# Patient Record
Sex: Male | Born: 1935 | Race: White | Hispanic: No | Marital: Married | Smoking: Former smoker
Health system: Southern US, Community
[De-identification: ages and names within clinical notes are randomized; demographics above are authoritative.]

## PROBLEM LIST (undated history)

## (undated) DIAGNOSIS — E7800 Pure hypercholesterolemia, unspecified: Secondary | ICD-10-CM

## (undated) DIAGNOSIS — Z8719 Personal history of other diseases of the digestive system: Secondary | ICD-10-CM

## (undated) DIAGNOSIS — J189 Pneumonia, unspecified organism: Secondary | ICD-10-CM

## (undated) DIAGNOSIS — I119 Hypertensive heart disease without heart failure: Secondary | ICD-10-CM

## (undated) DIAGNOSIS — I2510 Atherosclerotic heart disease of native coronary artery without angina pectoris: Secondary | ICD-10-CM

## (undated) DIAGNOSIS — K219 Gastro-esophageal reflux disease without esophagitis: Secondary | ICD-10-CM

## (undated) DIAGNOSIS — Q211 Atrial septal defect: Secondary | ICD-10-CM

## (undated) DIAGNOSIS — G459 Transient cerebral ischemic attack, unspecified: Principal | ICD-10-CM

## (undated) DIAGNOSIS — R569 Unspecified convulsions: Secondary | ICD-10-CM

## (undated) DIAGNOSIS — J1100 Influenza due to unidentified influenza virus with unspecified type of pneumonia: Secondary | ICD-10-CM

## (undated) DIAGNOSIS — M519 Unspecified thoracic, thoracolumbar and lumbosacral intervertebral disc disorder: Secondary | ICD-10-CM

## (undated) DIAGNOSIS — G259 Extrapyramidal and movement disorder, unspecified: Secondary | ICD-10-CM

## (undated) HISTORY — PX: BACK SURGERY: SHX140

## (undated) HISTORY — PX: COLONOSCOPY: SHX174

## (undated) HISTORY — PX: CATARACT EXTRACTION W/ INTRAOCULAR LENS  IMPLANT, BILATERAL: SHX1307

## (undated) HISTORY — DX: Hypertensive heart disease without heart failure: I11.9

## (undated) HISTORY — DX: Transient cerebral ischemic attack, unspecified: G45.9

## (undated) HISTORY — DX: Pneumonia, unspecified organism: J18.9

## (undated) HISTORY — DX: Atrial septal defect: Q21.1

## (undated) HISTORY — PX: REFRACTIVE SURGERY: SHX103

## (undated) HISTORY — DX: Influenza due to unidentified influenza virus with unspecified type of pneumonia: J11.00

## (undated) HISTORY — DX: Atherosclerotic heart disease of native coronary artery without angina pectoris: I25.10

## (undated) HISTORY — DX: Extrapyramidal and movement disorder, unspecified: G25.9

## (undated) HISTORY — DX: Unspecified thoracic, thoracolumbar and lumbosacral intervertebral disc disorder: M51.9

---

## 1978-05-17 HISTORY — PX: LUMBAR LAMINECTOMY: SHX95

## 1993-05-17 HISTORY — PX: CORONARY ARTERY BYPASS GRAFT: SHX141

## 1998-10-02 ENCOUNTER — Ambulatory Visit (HOSPITAL_COMMUNITY): Admission: RE | Admit: 1998-10-02 | Discharge: 1998-10-02 | Payer: Self-pay | Admitting: Cardiology

## 2001-03-20 ENCOUNTER — Ambulatory Visit (HOSPITAL_COMMUNITY): Admission: RE | Admit: 2001-03-20 | Discharge: 2001-03-20 | Payer: Self-pay | Admitting: Gastroenterology

## 2001-10-25 ENCOUNTER — Ambulatory Visit (HOSPITAL_COMMUNITY): Admission: RE | Admit: 2001-10-25 | Discharge: 2001-10-25 | Payer: Self-pay | Admitting: Obstetrics and Gynecology

## 2002-08-15 ENCOUNTER — Ambulatory Visit (HOSPITAL_COMMUNITY): Admission: RE | Admit: 2002-08-15 | Discharge: 2002-08-15 | Payer: Self-pay | Admitting: Specialist

## 2003-07-12 ENCOUNTER — Encounter: Admission: RE | Admit: 2003-07-12 | Discharge: 2003-07-12 | Payer: Self-pay | Admitting: Cardiology

## 2003-11-26 ENCOUNTER — Ambulatory Visit (HOSPITAL_COMMUNITY): Admission: RE | Admit: 2003-11-26 | Discharge: 2003-11-26 | Payer: Self-pay | Admitting: Gastroenterology

## 2005-05-28 ENCOUNTER — Encounter: Admission: RE | Admit: 2005-05-28 | Discharge: 2005-05-28 | Payer: Self-pay | Admitting: Gastroenterology

## 2011-05-21 DIAGNOSIS — R05 Cough: Secondary | ICD-10-CM | POA: Diagnosis not present

## 2011-05-21 DIAGNOSIS — R1310 Dysphagia, unspecified: Secondary | ICD-10-CM | POA: Diagnosis not present

## 2011-06-03 DIAGNOSIS — H35379 Puckering of macula, unspecified eye: Secondary | ICD-10-CM | POA: Diagnosis not present

## 2011-06-07 ENCOUNTER — Other Ambulatory Visit: Payer: Self-pay | Admitting: Cardiology

## 2011-06-22 ENCOUNTER — Other Ambulatory Visit: Payer: Self-pay | Admitting: Cardiology

## 2011-06-28 ENCOUNTER — Other Ambulatory Visit: Payer: Self-pay | Admitting: Gastroenterology

## 2011-06-28 DIAGNOSIS — R1310 Dysphagia, unspecified: Secondary | ICD-10-CM | POA: Diagnosis not present

## 2011-06-28 DIAGNOSIS — K219 Gastro-esophageal reflux disease without esophagitis: Secondary | ICD-10-CM | POA: Diagnosis not present

## 2011-06-28 DIAGNOSIS — R05 Cough: Secondary | ICD-10-CM | POA: Diagnosis not present

## 2011-07-01 ENCOUNTER — Ambulatory Visit
Admission: RE | Admit: 2011-07-01 | Discharge: 2011-07-01 | Disposition: A | Discharge status: Home / Self Care | Payer: Medicare Other | Source: Ambulatory Visit | Attending: Gastroenterology | Admitting: Gastroenterology

## 2011-07-01 DIAGNOSIS — R1310 Dysphagia, unspecified: Secondary | ICD-10-CM | POA: Diagnosis not present

## 2011-07-01 DIAGNOSIS — K219 Gastro-esophageal reflux disease without esophagitis: Secondary | ICD-10-CM | POA: Diagnosis not present

## 2011-07-01 DIAGNOSIS — K449 Diaphragmatic hernia without obstruction or gangrene: Secondary | ICD-10-CM | POA: Diagnosis not present

## 2011-07-22 ENCOUNTER — Other Ambulatory Visit: Payer: Self-pay | Admitting: Cardiology

## 2011-08-22 ENCOUNTER — Other Ambulatory Visit: Payer: Self-pay | Admitting: Cardiology

## 2011-08-27 DIAGNOSIS — Z951 Presence of aortocoronary bypass graft: Secondary | ICD-10-CM | POA: Diagnosis not present

## 2011-08-27 DIAGNOSIS — I119 Hypertensive heart disease without heart failure: Secondary | ICD-10-CM | POA: Diagnosis not present

## 2011-08-27 DIAGNOSIS — I2510 Atherosclerotic heart disease of native coronary artery without angina pectoris: Secondary | ICD-10-CM | POA: Diagnosis not present

## 2011-08-27 DIAGNOSIS — I209 Angina pectoris, unspecified: Secondary | ICD-10-CM | POA: Diagnosis not present

## 2011-08-27 DIAGNOSIS — E785 Hyperlipidemia, unspecified: Secondary | ICD-10-CM | POA: Diagnosis not present

## 2011-08-27 DIAGNOSIS — Z79899 Other long term (current) drug therapy: Secondary | ICD-10-CM | POA: Diagnosis not present

## 2011-09-02 DIAGNOSIS — R1310 Dysphagia, unspecified: Secondary | ICD-10-CM | POA: Diagnosis not present

## 2011-09-02 DIAGNOSIS — K219 Gastro-esophageal reflux disease without esophagitis: Secondary | ICD-10-CM | POA: Diagnosis not present

## 2011-10-07 DIAGNOSIS — J189 Pneumonia, unspecified organism: Secondary | ICD-10-CM | POA: Diagnosis not present

## 2011-10-07 DIAGNOSIS — R300 Dysuria: Secondary | ICD-10-CM | POA: Diagnosis not present

## 2011-10-07 DIAGNOSIS — R059 Cough, unspecified: Secondary | ICD-10-CM | POA: Diagnosis not present

## 2011-10-07 DIAGNOSIS — R05 Cough: Secondary | ICD-10-CM | POA: Diagnosis not present

## 2011-10-15 DIAGNOSIS — J189 Pneumonia, unspecified organism: Secondary | ICD-10-CM | POA: Diagnosis not present

## 2011-10-15 DIAGNOSIS — I951 Orthostatic hypotension: Secondary | ICD-10-CM | POA: Diagnosis not present

## 2011-11-24 DIAGNOSIS — R1310 Dysphagia, unspecified: Secondary | ICD-10-CM | POA: Diagnosis not present

## 2011-11-24 DIAGNOSIS — R05 Cough: Secondary | ICD-10-CM | POA: Diagnosis not present

## 2011-11-25 DIAGNOSIS — J189 Pneumonia, unspecified organism: Secondary | ICD-10-CM | POA: Diagnosis not present

## 2012-03-03 DIAGNOSIS — I209 Angina pectoris, unspecified: Secondary | ICD-10-CM | POA: Diagnosis not present

## 2012-03-03 DIAGNOSIS — R1310 Dysphagia, unspecified: Secondary | ICD-10-CM | POA: Diagnosis not present

## 2012-03-03 DIAGNOSIS — I2510 Atherosclerotic heart disease of native coronary artery without angina pectoris: Secondary | ICD-10-CM | POA: Diagnosis not present

## 2012-03-03 DIAGNOSIS — I119 Hypertensive heart disease without heart failure: Secondary | ICD-10-CM | POA: Diagnosis not present

## 2012-03-03 DIAGNOSIS — K219 Gastro-esophageal reflux disease without esophagitis: Secondary | ICD-10-CM | POA: Diagnosis not present

## 2012-03-03 DIAGNOSIS — E785 Hyperlipidemia, unspecified: Secondary | ICD-10-CM | POA: Diagnosis not present

## 2012-03-03 DIAGNOSIS — Z79899 Other long term (current) drug therapy: Secondary | ICD-10-CM | POA: Diagnosis not present

## 2012-03-03 DIAGNOSIS — Z951 Presence of aortocoronary bypass graft: Secondary | ICD-10-CM | POA: Diagnosis not present

## 2012-03-21 DIAGNOSIS — Z23 Encounter for immunization: Secondary | ICD-10-CM | POA: Diagnosis not present

## 2012-06-07 DIAGNOSIS — H35379 Puckering of macula, unspecified eye: Secondary | ICD-10-CM | POA: Diagnosis not present

## 2012-06-07 DIAGNOSIS — H26499 Other secondary cataract, unspecified eye: Secondary | ICD-10-CM | POA: Diagnosis not present

## 2012-07-06 DIAGNOSIS — R05 Cough: Secondary | ICD-10-CM | POA: Diagnosis not present

## 2012-07-06 DIAGNOSIS — R059 Cough, unspecified: Secondary | ICD-10-CM | POA: Diagnosis not present

## 2012-08-24 DIAGNOSIS — R269 Unspecified abnormalities of gait and mobility: Secondary | ICD-10-CM | POA: Diagnosis not present

## 2012-08-24 DIAGNOSIS — R05 Cough: Secondary | ICD-10-CM | POA: Diagnosis not present

## 2012-08-24 DIAGNOSIS — R059 Cough, unspecified: Secondary | ICD-10-CM | POA: Diagnosis not present

## 2012-09-05 DIAGNOSIS — K219 Gastro-esophageal reflux disease without esophagitis: Secondary | ICD-10-CM | POA: Diagnosis not present

## 2012-09-05 DIAGNOSIS — R1310 Dysphagia, unspecified: Secondary | ICD-10-CM | POA: Diagnosis not present

## 2012-09-08 DIAGNOSIS — I209 Angina pectoris, unspecified: Secondary | ICD-10-CM | POA: Diagnosis not present

## 2012-09-08 DIAGNOSIS — E785 Hyperlipidemia, unspecified: Secondary | ICD-10-CM | POA: Diagnosis not present

## 2012-09-08 DIAGNOSIS — Z79899 Other long term (current) drug therapy: Secondary | ICD-10-CM | POA: Diagnosis not present

## 2012-09-08 DIAGNOSIS — I2510 Atherosclerotic heart disease of native coronary artery without angina pectoris: Secondary | ICD-10-CM | POA: Diagnosis not present

## 2012-09-08 DIAGNOSIS — I119 Hypertensive heart disease without heart failure: Secondary | ICD-10-CM | POA: Diagnosis not present

## 2012-09-08 DIAGNOSIS — Z951 Presence of aortocoronary bypass graft: Secondary | ICD-10-CM | POA: Diagnosis not present

## 2012-10-11 ENCOUNTER — Encounter: Payer: Self-pay | Admitting: Neurology

## 2012-10-11 ENCOUNTER — Ambulatory Visit (INDEPENDENT_AMBULATORY_CARE_PROVIDER_SITE_OTHER): Payer: Medicare Other | Admitting: Neurology

## 2012-10-11 VITALS — BP 152/71 | HR 69 | Temp 98.3°F | Ht 69.0 in | Wt 204.0 lb

## 2012-10-11 DIAGNOSIS — G250 Essential tremor: Secondary | ICD-10-CM

## 2012-10-11 DIAGNOSIS — R490 Dysphonia: Secondary | ICD-10-CM | POA: Insufficient documentation

## 2012-10-11 DIAGNOSIS — R1319 Other dysphagia: Secondary | ICD-10-CM | POA: Diagnosis not present

## 2012-10-11 DIAGNOSIS — G20 Parkinson's disease: Secondary | ICD-10-CM | POA: Diagnosis not present

## 2012-10-11 DIAGNOSIS — J1100 Influenza due to unidentified influenza virus with unspecified type of pneumonia: Secondary | ICD-10-CM | POA: Insufficient documentation

## 2012-10-11 DIAGNOSIS — R251 Tremor, unspecified: Secondary | ICD-10-CM

## 2012-10-11 MED ORDER — CARBIDOPA-LEVODOPA 10-100 MG PO TABS: 1.0000 | ORAL_TABLET | Freq: Three times a day (TID) | ORAL | Status: DC

## 2012-10-11 NOTE — Progress Notes (Signed)
[facility]  Provider:  Dr Rodgerick Gilliand Referring Provider: Gildardo Cranker, MD Primary Care Physician:  Miguel Aschoff, MD  Chief Complaint  Patient presents with  . New Evaluation    tremor cogwheel rigidity, Ross, rm 10    HPI:  Clinton Hughes is a 79 y.o. male here as a referral from Dr. Tenny Craw for  Tremors and gait instability. Risk is 79 year old right-handed Caucasian married male reports that over the year 2013 he suffered twice from viral pneumonia he felt that he didn't recover completely to his former self felt deconditioned since and reports developing a tremor, muscle rigidity and a general sense of weakness. He also reports some balance problems or gait instability. A note is that the gentleman is well-nourished and she is to have a normal facial expression and not a masked face. His voice is coarse and he reports a history of cardia even for liquids. For 5 years ago he had such severe dysphagia  and he required a scratching procedure for the upper esophagus. He noticed that just last year following the pneumonia he seemed to have developed trouble handling saliva. Right now he states that he does not have any aspiration but that his slowing but also seems to be significantly delayed and slowed. He had no choking from solids or liquids.  His wife mentioned that he has begun sleeping more restlessly, she noticed his body posture to be rigid all night he actually is moving less than he used to. A year ago he had frequent frequently cold out in his sleep were seen to act out dreams, that has slightly decreased. However this is very reminiscent of REM behavior disorder.  As to his gait instability he had mentioned that he has to rise slowly or catch a couple of seconds before he initiates a movements safely vaginally when he comes from a seated position into the erect posture he seems to have trouble with orthostasis.  The patient also has sometimes diaphoretic episodes and  the skin seems to be moist and sweaty her, his wife has noticed that it seems to be related to the intake of spicy or acidic food or perhaps tomatoes. 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 She also had questions about alcohol intake in combination with dopaminergic medication. I asked [name] if he had noticed a change in his tremor when he drinks alcohol but he denies that he has seen a  Connection.    Review of Systems: Out of a complete 14 system review, the patient complains of only the following symptoms, and all other reviewed systems are negative.  tremor at rest and with action, dysphonia, gait instability.REM BD suspected.  Fall risk 8 points.  Denies auditory or visual hallucinations .  History   Social History  . Marital Status: Married    Spouse Name: N/A    Number of Children: N/A  . Years of Education: N/A   Occupational History  . Not on file.   Social History Main Topics  . Smoking status: Former Games developer  . Smokeless tobacco: Not on file     Comment: quit smoking 20-30 years ago  . Alcohol Use: Yes     Comment: 4-5 glasses of wine daily  . Drug Use: No  . Sexually Active: Not on file   Other Topics Concern  . Not on file   Social History Narrative  . No narrative on file  Family History  Problem Relation Age of Onset  . Thyroid disease Sister   . Thyroid disease Other     Past Medical History  Diagnosis Date  . Hypertension     benign  . Angina at rest   . Coronary artery disease   . Pneumonia 2014    x2  . Movement disorder   . Pneumonia and influenza      2 pneumonias within 2013.     Past Surgical History  Procedure Laterality Date  . Colonoscopy  2006/2008  . Coronary artery bypass graft  1995  . Lumbar laminectomy  1980  . Cataract extraction    . Electrocardiogram  2006  . Back surgery    . Bi pass      x5    Current Outpatient  Prescriptions  Medication Sig Dispense Refill  . amLODipine (NORVASC) 5 MG tablet Take 5 mg by mouth daily.      Marland Kitchen AMLODIPINE BESYLATE PO Take 5 mg by mouth daily.      Marland Kitchen aspirin 325 MG tablet Take 325 mg by mouth daily.      Marland Kitchen METOPROLOL TARTRATE PO Take 50 mg by mouth 2 (two) times daily.      Marland Kitchen omeprazole (PRILOSEC) 40 MG capsule Take 40 mg by mouth daily. Delayed release, once daily      . rosuvastatin (CRESTOR) 20 MG tablet Take 20 mg by mouth daily.      . valsartan (DIOVAN) 160 MG tablet Take 160 mg by mouth daily.      Marland Kitchen NITROSTAT 0.4 MG SL tablet        No current facility-administered medications for this visit.    Allergies as of 10/11/2012  . (No Known Allergies)    Vitals: BP 152/71  Pulse 69  Temp(Src) 98.3 F (36.8 C) (Oral)  Ht 5\' 9"  (1.753 m)  Wt 204 lb (92.534 kg)  BMI 30.11 kg/m2 Last Weight:  Wt Readings from Last 1 Encounters:  10/11/12 204 lb (92.534 kg)   Last Height:   Ht Readings from Last 1 Encounters:  10/11/12 5\' 9"  (1.753 m)     Physical exam:  General: The patient is awake, alert and appears not in acute distress. The patient is well groomed. Head: Normocephalic, atraumatic. Neck is supple. Mallampati 2, neck circumference:17 inches.  Cardiovascular:  Regular rate and rhythm, without  murmurs or carotid bruit, and without distended neck veins. Respiratory: Lungs are clear to auscultation. Skin:  Without evidence of edema, or rash, patient has moist and cold hands.  Trunk: BMI is elevated and patient  has normal posture.  Neurologic exam : The patient is awake and alert, oriented to place and time.  Memory subjective  described as intact. There is a normal attention span & concentration ability. Speech is fluent without dysarthria,  aphasia. Mood and affect are appropriate.  Cranial nerves: Pupils are equal and briskly reactive to light. Funduscopic exam status post cataract -  Without evidence of pallor or edema. Extraocular movements  in  vertical and horizontal planes intact and without nystagmus.  Visual fields by finger perimetry are intact. Hearing to finger rub intact.  Facial sensation intact to fine touch. Facial motor strength is symmetric and tongue and uvula barely visible. No tremor of the tongue,  move midline.  Motor exam:  generalized elevated  tone and cog-wheeling - normal muscle bulk and  strength in all extremities.  Sensory:  Fine touch, pinprick and vibration were tested in all extremities.  normal.  Coordination: Rapid alternating movements in the fingers/hands is tested and normal. Finger-to-nose maneuver tested and  With  Higher amplitude tremor on both sides. Gait and station: Patient walks without assistive device  And is able to un- assisted  climb up to the exam table. Strength within normal limits. Stance was at first unsteady and he needed corrective Stapp to the right side to steady himself. It seems to be an orthostatic component. When Mr. Harold walked old the whole way he had a normal Stapp web slightly propulsive gait and  lack of rotation in the lower spine. His wife noticed that he does not provide armswing at home, but he presented with armswing here doing his walking exercises. He could walk 40 yards in the 12 seconds, and current 180 with 4 steps He was able to rise from a chair with his arms crossed in front of his chest. He resumed an erect posture when standing. There was no pill rolling tremor noticed when the patient walked.  Deep tendon reflexes: in the  upper and lower extremities are symmetric and intact. Babinski maneuver response is  downgoing.   Assessment:  After physical and neurologic examination, review of laboratory studies, imaging, neurophysiology testing and pre-existing records, assessment will be reviewed on the problem list.   The patient presents with mixed symptoms of bilateral onset tremor, hoarseness and dysphagia, orthostatic dizziness. He did not present with loss  of armswing, he had no restriction in extraocular movements, nor jaw or tongue tremor. There is several symptoms that would speak for a Parkinson's primary disease, however not all fit into this picture. I would like to start with a lower dorsum of a dopaminergic medication to see if his tremor will improve.   Arrange for a gait and balance specific training with physical therapy. I would like to have orthostatic blood pressures obtained. The we'll review his medication list for possible side effects of other drugs.  Plan:  Treatment plan and additional workup will be reviewed under Problem List.

## 2012-10-11 NOTE — Addendum Note (Signed)
Addended by: Melvyn Novas on: 10/11/2012 03:06 PM   Modules accepted: Orders

## 2012-10-11 NOTE — Patient Instructions (Signed)
Orthostatic Hypotension Orthostatic hypotension is a sudden fall in blood pressure. It occurs when a person goes from a sitting or lying position to a standing position. CAUSES   Loss of body fluids (dehydration).  Medicines that lower blood pressure.  Sudden changes in posture, such as sudden standing when you have been sitting or lying down.  Taking too much of your medicine. SYMPTOMS   Lightheadedness or dizziness.  Fainting or near-fainting.  A fast heart rate (tachycardia).  Weakness.  Feeling tired (fatigue). DIAGNOSIS  Your caregiver may find the cause of orthostatic hypotension through:  A history and/or physical exam.  Checking your blood pressure. Your caregiver will check your blood pressure when you are:  Lying down.  Sitting.  Standing.  Tilt table testing. In this test, you are placed on a table that goes from a lying position to a standing position. You will be strapped to the table. This test helps to monitor your blood pressure and heart rate when you are in different positions. TREATMENT   If orthostatic hypotension is caused by your medicines, your caregiver will need to adjust your dosage. Do not stop or adjust your medicine on your own.  When changing positions, make these changes slowly. This allows your body to adjust to the different position.  Compression stockings that are worn on your lower legs may be helpful.  Your caregiver may have you consume extra salt. Do not add extra salt to your diet unless directed by your caregiver.  Eat frequent, small meals. Avoid sudden standing after eating.  Avoid hot showers or excessive heat.  Your caregiver may give you fluids through the vein (intravenous).  Your caregiver may put you on medicine to help enhance fluid retention. SEEK IMMEDIATE MEDICAL CARE IF:   You faint or have a near-fainting episode. Call your local emergency services (911 in [place]  You have or develop chest pain.  You  feel sick to your stomach (nauseous) or vomit.  You have a loss of feeling or movement in your arms or legs.  You have difficulty talking, slurred speech, or you are unable to talk.  You have difficulty thinking or have confused thinking. MAKE SURE YOU:   Understand these instructions.  Will watch your condition.  Will get help right away if you are not doing well or get worse. Document Released: 04/23/2002 Document Revised: 07/26/2011 Document Reviewed: 08/16/2008 [facility] Patient Information 2014 [place], [place]. Tremor Tremor is a rhythmic, involuntary muscular contraction characterized by oscillations (to-and-fro movements) of a part of the body. The most common of all involuntary movements, tremor can affect various body parts such as the hands, head, facial structures, vocal cords, trunk, and legs; most tremors, however, occur in the hands. Tremor often accompanies neurological disorders associated with aging. Although the disorder is not life-threatening, it can be responsible for functional disability and social embarrassment. TREATMENT  There are many types of tremor and several ways in which tremor is classified. The most common classification is by behavioral context or position. There are five categories of tremor within this classification: resting, postural, kinetic, task-specific, and psychogenic. Resting or static tremor occurs when the muscle is at rest, for example when the hands are lying on the lap. This type of tremor is often seen in patients with Parkinson's disease. Postural tremor occurs when a patient attempts to maintain posture, such as holding the hands outstretched. Postural tremors include physiological tremor, essential tremor, tremor with basal ganglia disease (also seen in patients with Parkinson's disease),  cerebellar postural tremor, tremor with peripheral neuropathy, post-traumatic tremor, and alcoholic tremor. Kinetic or intention (action) tremor occurs during  purposeful movement, for example during finger-to-nose testing. Task-specific tremor appears when performing goal-oriented tasks such as handwriting, speaking, or standing. This group consists of primary writing tremor, vocal tremor, and orthostatic tremor. Psychogenic tremor occurs in both older and younger patients. The key feature of this tremor is that it dramatically lessens or disappears when the patient is distracted. PROGNOSIS There are some treatment options available for tremor; the appropriate treatment depends on accurate diagnosis of the cause. Some tremors respond to treatment of the underlying condition, for example in some cases of psychogenic tremor treating the patient's underlying mental problem may cause the tremor to disappear. Also, patients with tremor due to Parkinson's disease may be treated with Levodopa drug therapy. Symptomatic drug therapy is available for several other tremors as well. For those cases of tremor in which there is no effective drug treatment, physical measures such as teaching the patient to brace the affected limb during the tremor are sometimes useful. Surgical intervention such as thalamotomy or deep brain stimulation may be useful in certain cases. Document Released: 04/23/2002 Document Revised: 07/26/2011 Document Reviewed: 05/03/2005 [facility] Patient Information 2014 [place], [place].

## 2012-10-20 ENCOUNTER — Ambulatory Visit
Admission: RE | Admit: 2012-10-20 | Discharge: 2012-10-20 | Disposition: A | Discharge status: Home / Self Care | Payer: Medicare Other | Source: Ambulatory Visit | Attending: Neurology | Admitting: Neurology

## 2012-10-20 DIAGNOSIS — G20 Parkinson's disease: Secondary | ICD-10-CM

## 2012-10-20 DIAGNOSIS — R1319 Other dysphagia: Secondary | ICD-10-CM

## 2012-10-20 DIAGNOSIS — R490 Dysphonia: Secondary | ICD-10-CM

## 2012-10-23 NOTE — Progress Notes (Signed)
Quick Note:  Please relate MRI results to patient. This patient is 22 and has no major changes , all can be age proportionate.   See addendum. CD ______

## 2012-10-30 ENCOUNTER — Ambulatory Visit: Payer: Medicare Other | Attending: Neurology | Admitting: Physical Therapy

## 2012-10-30 DIAGNOSIS — IMO0001 Reserved for inherently not codable concepts without codable children: Secondary | ICD-10-CM | POA: Insufficient documentation

## 2012-10-30 DIAGNOSIS — R5381 Other malaise: Secondary | ICD-10-CM | POA: Diagnosis not present

## 2012-10-30 DIAGNOSIS — R269 Unspecified abnormalities of gait and mobility: Secondary | ICD-10-CM | POA: Diagnosis not present

## 2012-10-30 DIAGNOSIS — Z9181 History of falling: Secondary | ICD-10-CM | POA: Insufficient documentation

## 2012-10-30 DIAGNOSIS — M6281 Muscle weakness (generalized): Secondary | ICD-10-CM | POA: Diagnosis not present

## 2012-11-03 ENCOUNTER — Ambulatory Visit: Payer: Medicare Other | Admitting: Physical Therapy

## 2012-11-03 DIAGNOSIS — R269 Unspecified abnormalities of gait and mobility: Secondary | ICD-10-CM | POA: Diagnosis not present

## 2012-11-03 DIAGNOSIS — IMO0001 Reserved for inherently not codable concepts without codable children: Secondary | ICD-10-CM | POA: Diagnosis not present

## 2012-11-03 DIAGNOSIS — M6281 Muscle weakness (generalized): Secondary | ICD-10-CM | POA: Diagnosis not present

## 2012-11-03 DIAGNOSIS — R5381 Other malaise: Secondary | ICD-10-CM | POA: Diagnosis not present

## 2012-11-03 DIAGNOSIS — Z9181 History of falling: Secondary | ICD-10-CM | POA: Diagnosis not present

## 2012-11-07 ENCOUNTER — Ambulatory Visit: Payer: Medicare Other | Admitting: Physical Therapy

## 2012-11-07 DIAGNOSIS — Z9181 History of falling: Secondary | ICD-10-CM | POA: Diagnosis not present

## 2012-11-07 DIAGNOSIS — R5381 Other malaise: Secondary | ICD-10-CM | POA: Diagnosis not present

## 2012-11-07 DIAGNOSIS — M6281 Muscle weakness (generalized): Secondary | ICD-10-CM | POA: Diagnosis not present

## 2012-11-07 DIAGNOSIS — R269 Unspecified abnormalities of gait and mobility: Secondary | ICD-10-CM | POA: Diagnosis not present

## 2012-11-07 DIAGNOSIS — IMO0001 Reserved for inherently not codable concepts without codable children: Secondary | ICD-10-CM | POA: Diagnosis not present

## 2012-11-09 ENCOUNTER — Ambulatory Visit: Payer: Medicare Other | Admitting: Physical Therapy

## 2012-11-09 DIAGNOSIS — R5381 Other malaise: Secondary | ICD-10-CM | POA: Diagnosis not present

## 2012-11-09 DIAGNOSIS — M6281 Muscle weakness (generalized): Secondary | ICD-10-CM | POA: Diagnosis not present

## 2012-11-09 DIAGNOSIS — Z9181 History of falling: Secondary | ICD-10-CM | POA: Diagnosis not present

## 2012-11-09 DIAGNOSIS — IMO0001 Reserved for inherently not codable concepts without codable children: Secondary | ICD-10-CM | POA: Diagnosis not present

## 2012-11-09 DIAGNOSIS — R269 Unspecified abnormalities of gait and mobility: Secondary | ICD-10-CM | POA: Diagnosis not present

## 2012-11-13 ENCOUNTER — Ambulatory Visit: Payer: Medicare Other | Admitting: Physical Therapy

## 2012-11-13 DIAGNOSIS — R269 Unspecified abnormalities of gait and mobility: Secondary | ICD-10-CM | POA: Diagnosis not present

## 2012-11-13 DIAGNOSIS — IMO0001 Reserved for inherently not codable concepts without codable children: Secondary | ICD-10-CM | POA: Diagnosis not present

## 2012-11-13 DIAGNOSIS — M6281 Muscle weakness (generalized): Secondary | ICD-10-CM | POA: Diagnosis not present

## 2012-11-13 DIAGNOSIS — R5381 Other malaise: Secondary | ICD-10-CM | POA: Diagnosis not present

## 2012-11-13 DIAGNOSIS — Z9181 History of falling: Secondary | ICD-10-CM | POA: Diagnosis not present

## 2012-11-15 ENCOUNTER — Ambulatory Visit: Payer: Medicare Other | Admitting: Physical Therapy

## 2012-11-27 DIAGNOSIS — I2510 Atherosclerotic heart disease of native coronary artery without angina pectoris: Secondary | ICD-10-CM | POA: Diagnosis not present

## 2012-11-27 DIAGNOSIS — Z79899 Other long term (current) drug therapy: Secondary | ICD-10-CM | POA: Diagnosis not present

## 2012-11-27 DIAGNOSIS — Z951 Presence of aortocoronary bypass graft: Secondary | ICD-10-CM | POA: Diagnosis not present

## 2012-11-27 DIAGNOSIS — I209 Angina pectoris, unspecified: Secondary | ICD-10-CM | POA: Diagnosis not present

## 2012-11-27 DIAGNOSIS — E785 Hyperlipidemia, unspecified: Secondary | ICD-10-CM | POA: Diagnosis not present

## 2012-11-27 DIAGNOSIS — I951 Orthostatic hypotension: Secondary | ICD-10-CM | POA: Diagnosis not present

## 2012-11-27 DIAGNOSIS — I119 Hypertensive heart disease without heart failure: Secondary | ICD-10-CM | POA: Diagnosis not present

## 2012-12-06 ENCOUNTER — Other Ambulatory Visit: Payer: Self-pay

## 2012-12-06 DIAGNOSIS — R251 Tremor, unspecified: Secondary | ICD-10-CM

## 2012-12-06 DIAGNOSIS — R1319 Other dysphagia: Secondary | ICD-10-CM

## 2012-12-06 DIAGNOSIS — G20 Parkinson's disease: Secondary | ICD-10-CM

## 2012-12-06 MED ORDER — CARBIDOPA-LEVODOPA 10-100 MG PO TABS: 1.0000 | ORAL_TABLET | Freq: Three times a day (TID) | ORAL | Status: DC

## 2012-12-21 DIAGNOSIS — H35379 Puckering of macula, unspecified eye: Secondary | ICD-10-CM | POA: Diagnosis not present

## 2012-12-21 DIAGNOSIS — H26499 Other secondary cataract, unspecified eye: Secondary | ICD-10-CM | POA: Diagnosis not present

## 2013-01-05 ENCOUNTER — Encounter (INDEPENDENT_AMBULATORY_CARE_PROVIDER_SITE_OTHER): Payer: Medicare Other | Admitting: Ophthalmology

## 2013-01-05 DIAGNOSIS — H43819 Vitreous degeneration, unspecified eye: Secondary | ICD-10-CM | POA: Diagnosis not present

## 2013-01-05 DIAGNOSIS — I10 Essential (primary) hypertension: Secondary | ICD-10-CM

## 2013-01-05 DIAGNOSIS — H35379 Puckering of macula, unspecified eye: Secondary | ICD-10-CM | POA: Diagnosis not present

## 2013-01-05 DIAGNOSIS — H35039 Hypertensive retinopathy, unspecified eye: Secondary | ICD-10-CM

## 2013-01-16 ENCOUNTER — Encounter: Payer: Self-pay | Admitting: Nurse Practitioner

## 2013-01-16 ENCOUNTER — Ambulatory Visit (INDEPENDENT_AMBULATORY_CARE_PROVIDER_SITE_OTHER): Payer: Medicare Other | Admitting: Nurse Practitioner

## 2013-01-16 VITALS — BP 112/63 | HR 77 | Ht 68.0 in | Wt 200.0 lb

## 2013-01-16 DIAGNOSIS — R269 Unspecified abnormalities of gait and mobility: Secondary | ICD-10-CM | POA: Diagnosis not present

## 2013-01-16 DIAGNOSIS — G20 Parkinson's disease: Secondary | ICD-10-CM | POA: Diagnosis not present

## 2013-01-16 NOTE — Progress Notes (Signed)
Reason for visit : follow up for  tremor, rigidity  HPI: Clinton Hughes, 79 year old white male returns for followup after initial evaluation with Dr. Vickey Huger 10/11/2012 for tremors and gait instability. Patient has had tremor muscle rigidity and a general sense of weakness since 2013 after suffering viral pneumonia x2. He claims today that his tremor has actually gotten better since last seen and he did not start the carbidopa levodopa because he read the side effects and claims  he has dizziness already. He does not have any masking of his face. He has trouble swallowing and has had the esophagus stretched in the past. He can handle solids and liquids without difficulty. She also claims that his Norvasc dose has been decreased since last seen and his gait instability is better however he still has dizziness upon standing or making sudden movements. He does not use an assistive device and he has had one fall due to being lightheaded since last seen. He reports that his tremor does not interfere with his ability to perform his activities of daily living. He continues to drive without problems  ROS:  14 system review of symptoms is negative except for the following Constipation, lightheaded on standing, trouble swallowing    Medications Current Outpatient Prescriptions on File Prior to Visit  Medication Sig Dispense Refill  . amLODipine (NORVASC) 5 MG tablet Take 2.5 mg by mouth daily.       Marland Kitchen aspirin 325 MG tablet Take 325 mg by mouth daily.      Marland Kitchen METOPROLOL TARTRATE PO Take 50 mg by mouth 2 (two) times daily.      Marland Kitchen omeprazole (PRILOSEC) 40 MG capsule Take 40 mg by mouth daily. Delayed release, once daily      . rosuvastatin (CRESTOR) 20 MG tablet Take 20 mg by mouth daily.      . valsartan (DIOVAN) 160 MG tablet Take 160 mg by mouth daily.       No current facility-administered medications on file prior to visit.    Allergies No Known Allergies  Physical Exam General: well developed,  well nourished, seated, in no evident distress, no masking of the face Head: head normocephalic and atraumatic. Oropharynx benign Neck: supple with no carotid  bruits Cardiovascular: regular rate and rhythm, no murmurs  Neurologic Exam Mental Status: Awake and fully alert. Oriented to place and time. Follows all commands. Speech and language normal.   Cranial Nerves: Fundoscopic exam post cataract without papilledema Pupils equal, briskly reactive to light. Extraocular movements full without nystagmus. Visual fields full to confrontation. Hearing intact and symmetric to finger snap. Facial sensation intact. Face, tongue, palate move normally and symmetrically. Neck flexion and extension normal. Negative Myerson's sign Motor: Normal bulk and tone. Normal strength in all tested extremity muscles.No focal weakness no cogwheeling or rigidity. Very mild tremor in the fingers bilaterally Sensory.: intact to touch and pinprick and vibratory.  Coordination: Rapid alternating movements normal in all extremities. Finger-to-nose and heel-to-shin performed accurately bilaterally. Gait and Station: Arises from chair with arms across the chest no difficulty  Stance is normal. Gait demonstrates normal stride length and balance . Ambulated 70 feet without difficulty and no  decreased arm swing, no difficulty with turns and fragmented steps, with no tremor noted when ambulating .  Reflexes: 2+ and symmetric. Toes downgoing.     ASSESSMENT: Presents with mixed symptoms of bilateral onset mild tremor, dysphagia for 5 years and orthostatic dizziness, he has no decreased arm swing, no masking of the face,  no cogwheeling rigidity. He did not start carbidopa levodopa due to side effects of dizziness which he already has. This has gotten better with decrease in Norvasc.    PLAN: MRI of the brain 10/20/12 shows mild changes of chronic microvascular ischemia and generalized cerebral atrophy. Patient is given a  copy Currently not taking carbidopa levodopa, tremor is stable today Rehabilitation has concluded, continue home exercise program Followup in 6 months with Dr. Leander Rams Darrol Angel, GNP-BC APRN

## 2013-01-16 NOTE — Patient Instructions (Addendum)
He requested written copy of MRI of the brain and given Currently not taking carbidopa levodopa, tremor is stable today Rehabilitation has concluded, continue home exercise program Followup in 6 months with Dr. Vickey Huger

## 2013-02-07 DIAGNOSIS — H35379 Puckering of macula, unspecified eye: Secondary | ICD-10-CM | POA: Diagnosis not present

## 2013-02-07 DIAGNOSIS — H26499 Other secondary cataract, unspecified eye: Secondary | ICD-10-CM | POA: Diagnosis not present

## 2013-02-09 DIAGNOSIS — Z23 Encounter for immunization: Secondary | ICD-10-CM | POA: Diagnosis not present

## 2013-02-09 DIAGNOSIS — I10 Essential (primary) hypertension: Secondary | ICD-10-CM | POA: Diagnosis not present

## 2013-02-09 DIAGNOSIS — E7800 Pure hypercholesterolemia, unspecified: Secondary | ICD-10-CM | POA: Diagnosis not present

## 2013-02-09 DIAGNOSIS — Z0000 Encounter for general adult medical examination without abnormal findings: Secondary | ICD-10-CM | POA: Diagnosis not present

## 2013-02-14 ENCOUNTER — Encounter (HOSPITAL_COMMUNITY): Payer: Self-pay | Admitting: Radiology

## 2013-02-14 ENCOUNTER — Emergency Department (HOSPITAL_COMMUNITY): Payer: Medicare Other

## 2013-02-14 ENCOUNTER — Observation Stay (HOSPITAL_COMMUNITY)
Admission: EM | Admit: 2013-02-14 | Discharge: 2013-02-15 | Disposition: A | Discharge status: Home / Self Care | Payer: Medicare Other | Attending: Internal Medicine | Admitting: Internal Medicine

## 2013-02-14 DIAGNOSIS — E871 Hypo-osmolality and hyponatremia: Secondary | ICD-10-CM

## 2013-02-14 DIAGNOSIS — Z7982 Long term (current) use of aspirin: Secondary | ICD-10-CM | POA: Insufficient documentation

## 2013-02-14 DIAGNOSIS — R209 Unspecified disturbances of skin sensation: Principal | ICD-10-CM | POA: Insufficient documentation

## 2013-02-14 DIAGNOSIS — Z87891 Personal history of nicotine dependence: Secondary | ICD-10-CM | POA: Insufficient documentation

## 2013-02-14 DIAGNOSIS — R0789 Other chest pain: Secondary | ICD-10-CM | POA: Diagnosis not present

## 2013-02-14 DIAGNOSIS — R1319 Other dysphagia: Secondary | ICD-10-CM

## 2013-02-14 DIAGNOSIS — R200 Anesthesia of skin: Secondary | ICD-10-CM

## 2013-02-14 DIAGNOSIS — I10 Essential (primary) hypertension: Secondary | ICD-10-CM | POA: Insufficient documentation

## 2013-02-14 DIAGNOSIS — G20 Parkinson's disease: Secondary | ICD-10-CM

## 2013-02-14 DIAGNOSIS — G20C Parkinsonism, unspecified: Secondary | ICD-10-CM

## 2013-02-14 DIAGNOSIS — R251 Tremor, unspecified: Secondary | ICD-10-CM

## 2013-02-14 DIAGNOSIS — I2510 Atherosclerotic heart disease of native coronary artery without angina pectoris: Secondary | ICD-10-CM | POA: Insufficient documentation

## 2013-02-14 DIAGNOSIS — G20A1 Parkinson's disease without dyskinesia, without mention of fluctuations: Secondary | ICD-10-CM | POA: Insufficient documentation

## 2013-02-14 DIAGNOSIS — R490 Dysphonia: Secondary | ICD-10-CM | POA: Insufficient documentation

## 2013-02-14 DIAGNOSIS — R269 Unspecified abnormalities of gait and mobility: Secondary | ICD-10-CM

## 2013-02-14 DIAGNOSIS — Z79899 Other long term (current) drug therapy: Secondary | ICD-10-CM | POA: Insufficient documentation

## 2013-02-14 DIAGNOSIS — Z951 Presence of aortocoronary bypass graft: Secondary | ICD-10-CM | POA: Insufficient documentation

## 2013-02-14 DIAGNOSIS — R1310 Dysphagia, unspecified: Secondary | ICD-10-CM | POA: Insufficient documentation

## 2013-02-14 DIAGNOSIS — Z8701 Personal history of pneumonia (recurrent): Secondary | ICD-10-CM | POA: Insufficient documentation

## 2013-02-14 DIAGNOSIS — R079 Chest pain, unspecified: Secondary | ICD-10-CM

## 2013-02-14 DIAGNOSIS — F1010 Alcohol abuse, uncomplicated: Secondary | ICD-10-CM

## 2013-02-14 DIAGNOSIS — J1100 Influenza due to unidentified influenza virus with unspecified type of pneumonia: Secondary | ICD-10-CM

## 2013-02-14 HISTORY — DX: Personal history of other diseases of the digestive system: Z87.19

## 2013-02-14 HISTORY — DX: Pure hypercholesterolemia, unspecified: E78.00

## 2013-02-14 HISTORY — DX: Gastro-esophageal reflux disease without esophagitis: K21.9

## 2013-02-14 LAB — CBC
Hemoglobin: 12 g/dL — ABNORMAL LOW (ref 13.0–17.0)
MCH: 33.1 pg (ref 26.0–34.0)
MCHC: 34.9 g/dL (ref 30.0–36.0)
MCV: 94.8 fL (ref 78.0–100.0)
Platelets: 187 10*3/uL (ref 150–400)
RBC: 3.63 MIL/uL — ABNORMAL LOW (ref 4.22–5.81)

## 2013-02-14 LAB — HEMOGLOBIN A1C
Hgb A1c MFr Bld: 5.6 % (ref <5.7)
Mean Plasma Glucose: 114 mg/dL (ref <117)

## 2013-02-14 LAB — URINALYSIS, ROUTINE W REFLEX MICROSCOPIC
Ketones, ur: 15 mg/dL — AB
Leukocytes, UA: NEGATIVE
Nitrite: NEGATIVE
Protein, ur: NEGATIVE mg/dL
Urobilinogen, UA: 1 mg/dL (ref 0.0–1.0)

## 2013-02-14 LAB — BASIC METABOLIC PANEL
CO2: 18 mEq/L — ABNORMAL LOW (ref 19–32)
Calcium: 9.2 mg/dL (ref 8.4–10.5)
Creatinine, Ser: 1.14 mg/dL (ref 0.50–1.35)
GFR calc non Af Amer: 57 mL/min — ABNORMAL LOW (ref >90)
Glucose, Bld: 131 mg/dL — ABNORMAL HIGH (ref 70–99)
Sodium: 129 mEq/L — ABNORMAL LOW (ref 135–145)

## 2013-02-14 LAB — TROPONIN I
Troponin I: <0.3 ng/mL (ref <0.30)
Troponin I: 0.72 ng/mL (ref <0.30)

## 2013-02-14 LAB — RAPID URINE DRUG SCREEN, HOSP PERFORMED
Barbiturates: NOT DETECTED
Cocaine: NOT DETECTED

## 2013-02-14 LAB — ETHANOL: Alcohol, Ethyl (B): <11 mg/dL (ref 0–11)

## 2013-02-14 LAB — POCT I-STAT TROPONIN I: Troponin i, poc: 0.01 ng/mL (ref 0.00–0.08)

## 2013-02-14 LAB — GLUCOSE, CAPILLARY
Glucose-Capillary: 119 mg/dL — ABNORMAL HIGH (ref 70–99)
Glucose-Capillary: 114 mg/dL — ABNORMAL HIGH (ref 70–99)

## 2013-02-14 LAB — HEPATIC FUNCTION PANEL
Albumin: 3.9 g/dL (ref 3.5–5.2)
Alkaline Phosphatase: 74 U/L (ref 39–117)
Bilirubin, Direct: 0.2 mg/dL (ref 0.0–0.3)
Indirect Bilirubin: 0.4 mg/dL (ref 0.3–0.9)
Total Bilirubin: 0.6 mg/dL (ref 0.3–1.2)

## 2013-02-14 MED ORDER — METOPROLOL TARTRATE 50 MG PO TABS
50.0000 mg | ORAL_TABLET | Freq: Two times a day (BID) | ORAL | Status: DC
  Administered 2013-02-14 – 2013-02-15 (×3): 50 mg via ORAL
  Filled 2013-02-14 (×4): qty 1

## 2013-02-14 MED ORDER — ATORVASTATIN CALCIUM 40 MG PO TABS
40.0000 mg | ORAL_TABLET | Freq: Every day | ORAL | Status: DC
  Administered 2013-02-14 – 2013-02-15 (×2): 40 mg via ORAL
  Filled 2013-02-14 (×2): qty 1

## 2013-02-14 MED ORDER — ENOXAPARIN SODIUM 40 MG/0.4ML ~~LOC~~ SOLN
40.0000 mg | SUBCUTANEOUS | Status: DC
  Administered 2013-02-14 – 2013-02-15 (×2): 40 mg via SUBCUTANEOUS
  Filled 2013-02-14 (×2): qty 0.4

## 2013-02-14 MED ORDER — AMLODIPINE BESYLATE 2.5 MG PO TABS
2.5000 mg | ORAL_TABLET | Freq: Every day | ORAL | Status: DC
  Administered 2013-02-14 – 2013-02-15 (×2): 2.5 mg via ORAL
  Filled 2013-02-14 (×2): qty 1

## 2013-02-14 MED ORDER — IRBESARTAN 150 MG PO TABS
150.0000 mg | ORAL_TABLET | Freq: Every day | ORAL | Status: DC
  Administered 2013-02-14 – 2013-02-15 (×2): 150 mg via ORAL
  Filled 2013-02-14 (×2): qty 1

## 2013-02-14 MED ORDER — FOLIC ACID 1 MG PO TABS
1.0000 mg | ORAL_TABLET | Freq: Every day | ORAL | Status: DC
  Administered 2013-02-14 – 2013-02-15 (×2): 1 mg via ORAL
  Filled 2013-02-14 (×2): qty 1

## 2013-02-14 MED ORDER — ADULT MULTIVITAMIN W/MINERALS CH
1.0000 | ORAL_TABLET | Freq: Every day | ORAL | Status: DC
  Administered 2013-02-14 – 2013-02-15 (×2): 1 via ORAL
  Filled 2013-02-14 (×2): qty 1

## 2013-02-14 MED ORDER — PANTOPRAZOLE SODIUM 40 MG PO TBEC
80.0000 mg | DELAYED_RELEASE_TABLET | Freq: Every day | ORAL | Status: DC
  Administered 2013-02-14 – 2013-02-15 (×2): 80 mg via ORAL
  Filled 2013-02-14 (×2): qty 2

## 2013-02-14 MED ORDER — LORAZEPAM 1 MG PO TABS
1.0000 mg | ORAL_TABLET | Freq: Four times a day (QID) | ORAL | Status: DC | PRN
  Administered 2013-02-15: 1 mg via ORAL
  Filled 2013-02-14: qty 1

## 2013-02-14 MED ORDER — THIAMINE HCL 100 MG/ML IJ SOLN
100.0000 mg | Freq: Every day | INTRAMUSCULAR | Status: DC
  Filled 2013-02-14 (×2): qty 1

## 2013-02-14 MED ORDER — VITAMIN B-1 100 MG PO TABS
100.0000 mg | ORAL_TABLET | Freq: Every day | ORAL | Status: DC
  Administered 2013-02-14 – 2013-02-15 (×2): 100 mg via ORAL
  Filled 2013-02-14 (×2): qty 1

## 2013-02-14 MED ORDER — ASPIRIN 325 MG PO TABS
325.0000 mg | ORAL_TABLET | Freq: Every day | ORAL | Status: DC
  Administered 2013-02-14 – 2013-02-15 (×2): 325 mg via ORAL
  Filled 2013-02-14 (×2): qty 1

## 2013-02-14 MED ORDER — SODIUM CHLORIDE 0.9 % IV BOLUS (SEPSIS)
500.0000 mL | Freq: Once | INTRAVENOUS | Status: AC
  Administered 2013-02-14: 500 mL via INTRAVENOUS

## 2013-02-14 MED ORDER — LORAZEPAM 2 MG/ML IJ SOLN: 1.0000 mg | Freq: Four times a day (QID) | INTRAMUSCULAR | Status: DC | PRN

## 2013-02-14 NOTE — ED Notes (Signed)
Lab is at bedside.

## 2013-02-14 NOTE — ED Notes (Signed)
States about 30 mins ago had some numbness in both arms but has gone away  But weakness remains no sob

## 2013-02-14 NOTE — ED Notes (Signed)
Hospitalist MD at bedside. 

## 2013-02-14 NOTE — Progress Notes (Signed)
VASCULAR LAB PRELIMINARY  PRELIMINARY  PRELIMINARY  PRELIMINARY  Carotid duplex  completed.    Preliminary report:  Bilateral:  1-39% ICA stenosis.  Vertebral artery flow is antegrade.      Consandra Laske, RVT 02/14/2013, 4:53 PM

## 2013-02-14 NOTE — ED Notes (Signed)
Neurology MD at bedside

## 2013-02-14 NOTE — H&P (Addendum)
[facility] History and Physical  HAMMOND OBEIRNE AVW:[id number] DOB: 08-15-1925 DOA: 02/14/2013  Referring physician: er PCP: Miguel Aschoff, MD  Specialists: neurology  Chief Complaint: numbness  HPI: Clinton Hughes is a 79 y.o. male  Who comes in c/o several symptoms.  First was the first 3 fingers, his right sided back and right side of his face.  This has all resolved.  He also had an episode of dizziness when he got out of his car yesterday.  No SOB, mild chest tightness.  No fever no chills.  He does drink a significant amount of wine/day- 3+, usually starting in the early morning.  He did drink less alcohol yesterday.    In the past, he has been worked up by neurology for tremor- last MRI was in June 2014.  In the Er, he was found to have a low Na 129.  Ct scan of brain was ok but neuro was consulted to r/o TIA.    Review of Systems: all systems reviewed, negative unless stated above   Past Medical History  Diagnosis Date  . Hypertension     benign  . Angina at rest   . Coronary artery disease   . Pneumonia 2014    x2  . Movement disorder   . Pneumonia and influenza      2 pneumonias within 2013.   history of dysphagia due to hiatal hernia   Past Surgical History  Procedure Laterality Date  . Colonoscopy  2006/2008  . Coronary artery bypass graft  1995  . Lumbar laminectomy  1980  . Cataract extraction    . Electrocardiogram  2006  . Back surgery    . Bi pass      x5   Social History:  reports that he has quit smoking. He has never used smokeless tobacco. He reports that  drinks alcohol. He reports that he does not use illicit drugs.   No Known Allergies  Family History  Problem Relation Age of Onset  . Thyroid disease Sister   . Thyroid disease Other     Prior to Admission medications   Medication Sig Start Date End Date Taking? Authorizing Provider  amLODipine (NORVASC) 5 MG tablet Take 2.5 mg by mouth daily.    Yes Historical Provider, MD   aspirin 325 MG tablet Take 325 mg by mouth daily.   Yes Historical Provider, MD  B Complex-C (SUPER B COMPLEX PO) Take 1 capsule by mouth daily.   Yes Historical Provider, MD  ibuprofen (ADVIL,MOTRIN) 200 MG tablet Take 600 mg by mouth every 6 (six) hours as needed for pain.   Yes Historical Provider, MD  metoprolol (LOPRESSOR) 50 MG tablet Take 50 mg by mouth 2 (two) times daily.   Yes Historical Provider, MD  omeprazole (PRILOSEC) 40 MG capsule Take 40 mg by mouth 2 (two) times daily. Delayed release, once daily   Yes Historical Provider, MD  rosuvastatin (CRESTOR) 20 MG tablet Take 20 mg by mouth daily.   Yes Historical Provider, MD  valsartan (DIOVAN) 160 MG tablet Take 160 mg by mouth daily.   Yes Historical Provider, MD   Physical Exam: Filed Vitals:   02/14/13 1330  BP: 180/71  Pulse: 103  Temp:   Resp: 23     General:  A+Ox3, NAD  Eyes: wnl  ENT: wnl  Neck: supple  Cardiovascular: tachy  Respiratory: clear anterior  Abdomen: +BS, soft, NT-obese  Skin: no rashes or lesions  Musculoskeletal: moves all 4 ext, no  weakness  Psychiatric: no SI/no HI  Neurologic: mild tremors, no focal deficit  Labs on Admission:  Basic Metabolic Panel:  Recent Labs Lab 02/14/13 1051  NA 129*  K 4.4  CL 92*  CO2 18*  GLUCOSE 131*  BUN 14  CREATININE 1.14  CALCIUM 9.2   Liver Function Tests:  Recent Labs Lab 02/14/13 1051  AST 41*  ALT 21  ALKPHOS 74  BILITOT 0.6  PROT 6.9  ALBUMIN 3.9   No results found for this basename: LIPASE, AMYLASE,  in the last 168 hours No results found for this basename: AMMONIA,  in the last 168 hours CBC:  Recent Labs Lab 02/14/13 1051  WBC 8.4  HGB 12.0*  HCT 34.4*  MCV 94.8  PLT 187   Cardiac Enzymes: No results found for this basename: CKTOTAL, CKMB, CKMBINDEX, TROPONINI,  in the last 168 hours  BNP (last 3 results) No results found for this basename: PROBNP,  in the last 8760 hours CBG:  Recent Labs Lab  02/14/13 1014  GLUCAP 164*    Radiological Exams on Admission: Dg Chest 2 View  02/14/2013   CLINICAL DATA:  Chest pressure and numbness radiating into the right hand. Weakness.  EXAM: CHEST  2 VIEW  COMPARISON:  07/06/2012  FINDINGS: There is slight cardiomegaly. Pulmonary vascularity is normal and the lungs are clear. Prior CABG. Prominent left pericardial fat pad. No acute osseous abnormality.  IMPRESSION: No acute abnormalities.   Electronically Signed   By: Geanie Cooley   On: 02/14/2013 12:15   Ct Head Wo Contrast  02/14/2013   CLINICAL DATA:  Right-sided numbness  EXAM: CT HEAD WITHOUT CONTRAST  TECHNIQUE: Contiguous axial images were obtained from the base of the skull through the vertex without intravenous contrast.  COMPARISON:  MRI 10/20/2012  FINDINGS: The brain shows generalized atrophy. There are mild chronic small-vessel changes within the deep white matter. No sign of acute infarction, mass lesion, hemorrhage, hydrocephalus or extra-axial collection. Mega cisterna magna incidentally noted, not pathologic. No calvarial abnormality. Sinuses are clear.  IMPRESSION: No acute finding. Atrophy and chronic small vessel disease.   Electronically Signed   By: Paulina Fusi M.D.   On: 02/14/2013 12:24    EKG: Independently reviewed. Tachy with RBBR- no old EKGs?  Assessment/Plan Principal Problem:   Numbness Active Problems:   Hyponatremia   Alcohol abuse, daily use   Chest pain   1. Numbness- right hand, back and face, around mouth- resolved, ?TIA vs hypotension vs hypoxia- MRI, echo, carotids- await recs from Neuro 2. hyponatremia- ?from alcohol, IVF and recheck in AM 3. Alcohol abuse- drinks 3+ per day- starts early in AM- CIWA 4. Chest pressure- cycle CE (has seen Dr. Donnie Aho in past), chest pain free now, place on tele 5. Sinus tachy- ?withdrawal  neuro  Code Status: full Family Communication: patient at bedside Disposition Plan: obs  Time spent: 75 min  VANN,  JESSICA [facility] Pager [phone]  If 7PM-7AM, please contact night-coverage [email or web address] Password [email or web address] 02/14/2013, 1:54 PM

## 2013-02-14 NOTE — Consult Note (Signed)
NEURO HOSPITALIST CONSULT NOTE    Reason for Consult: Right Sided Tingling  HPI:                                                                                                                                          Clinton Hughes is an 79 y.o. male who is seen by [facility] for tremor and possible parkinson's.  Patient was doing well this am but noted to have right hand middle, ring and small finger numbess. This then migrated to his right lower back and his right lips. At present time he has no tingling and all his symptoms have resolved.  There was some mention of bilateral LE paresthesia but this is a chronic problem. Currently his sodium is 129 but calcium is normal.  Per wife he drinks a significant amount of ETOH daily (">4 glasses of wine on a low intake day").   He staets that it was a numbness, not a tingling.   Past Medical History  Diagnosis Date  . Hypertension     benign  . Angina at rest   . Coronary artery disease   . Pneumonia 2014    x2  . Movement disorder   . Pneumonia and influenza      2 pneumonias within 2013.     Past Surgical History  Procedure Laterality Date  . Colonoscopy  2006/2008  . Coronary artery bypass graft  1995  . Lumbar laminectomy  1980  . Cataract extraction    . Electrocardiogram  2006  . Back surgery    . Bi pass      x5    Family History  Problem Relation Age of Onset  . Thyroid disease Sister   . Thyroid disease Other     Social History:  reports that he has quit smoking. He has never used smokeless tobacco. He reports that  drinks alcohol. He reports that he does not use illicit drugs.  No Known Allergies  MEDICATIONS:                                                                                                                     No current facility-administered medications for this encounter.   Current Outpatient Prescriptions  Medication Sig Dispense Refill  . amLODipine (  NORVASC) 5 MG tablet Take  2.5 mg by mouth daily.       Marland Kitchen aspirin 325 MG tablet Take 325 mg by mouth daily.      . B Complex-C (SUPER B COMPLEX PO) Take 1 capsule by mouth daily.      Marland Kitchen ibuprofen (ADVIL,MOTRIN) 200 MG tablet Take 600 mg by mouth every 6 (six) hours as needed for pain.      . metoprolol (LOPRESSOR) 50 MG tablet Take 50 mg by mouth 2 (two) times daily.      Marland Kitchen omeprazole (PRILOSEC) 40 MG capsule Take 40 mg by mouth 2 (two) times daily. Delayed release, once daily      . rosuvastatin (CRESTOR) 20 MG tablet Take 20 mg by mouth daily.      . valsartan (DIOVAN) 160 MG tablet Take 160 mg by mouth daily.          ROS:                                                                                                                                       History obtained from the patient  General ROS: negative for - chills, fatigue, fever, night sweats, weight gain or weight loss Psychological ROS: negative for - behavioral disorder, hallucinations, memory difficulties, mood swings or suicidal ideation Ophthalmic ROS: negative for - blurry vision, double vision, eye pain or loss of vision ENT ROS: negative for - epistaxis, nasal discharge, oral lesions, sore throat, tinnitus or vertigo Allergy and Immunology ROS: negative for - hives or itchy/watery eyes Hematological and Lymphatic ROS: negative for - bleeding problems, bruising or swollen lymph nodes Endocrine ROS: negative for - galactorrhea, hair pattern changes, polydipsia/polyuria or temperature intolerance Respiratory ROS: negative for - cough, hemoptysis, shortness of breath or wheezing Cardiovascular ROS: negative for - chest pain, dyspnea on exertion, edema or irregular heartbeat Gastrointestinal ROS: negative for - abdominal pain, diarrhea, hematemesis, nausea/vomiting or stool incontinence Genito-Urinary ROS: negative for - dysuria, hematuria, incontinence or urinary frequency/urgency Musculoskeletal ROS: negative for - joint swelling or muscular  weakness Neurological ROS: as noted in HPI Dermatological ROS: negative for rash and skin lesion changes   Blood pressure 182/74, pulse 101, temperature 98.2 F (36.8 C), resp. rate 19, SpO2 96.00%.   Neurologic Examination:                                                                                                      Mental Status: Alert, oriented, thought content  appropriate.  Speech fluent without evidence of aphasia.  Able to follow 3 step commands without difficulty. Cranial Nerves: II: Discs flat bilaterally; Visual fields grossly normal, pupils equal, round, reactive to light and accommodation III,IV, VI: ptosis not present, extra-ocular motions intact bilaterally V,VII: smile symmetric, facial light touch sensation normal bilaterally VIII: hearing normal bilaterally IX,X: gag reflex present XI: bilateral shoulder shrug XII: midline tongue extension Motor: Right : Upper extremity   5/5    Left:     Upper extremity   5/5  Lower extremity   5/5     Lower extremity   5/5 --postural and intentional tremor Tone and bulk:normal tone throughout; no atrophy noted Sensory: Pinprick and light touch intact throughout, bilaterally.  Decreased vibratory sensation up to knees and temperature up to mid calf.  Deep Tendon Reflexes:  Right: Upper Extremity   Left: Upper extremity   biceps (C-5 to C-6) 2/4   biceps (C-5 to C-6) 2/4 tricep (C7) 2/4    triceps (C7) 2/4 Brachioradialis (C6) 2/4  Brachioradialis (C6) 2/4  Lower Extremity Lower Extremity  quadriceps (L-2 to L-4) 2/4   quadriceps (L-2 to L-4) 2/4 Achilles (S1) 2/4   Achilles (S1) 2/4  Plantars: Right: downgoing   Left: downgoing Cerebellar: normal finger-to-nose,  normal heel-to-shin test Gait: not tested CV: pulses palpable throughout    No results found for this basename: cbc, bmp, coags, chol, tri, ldl, hga1c    Results for orders placed during the hospital encounter of 02/14/13 (from the past 48 hour(s))   GLUCOSE, CAPILLARY     Status: Abnormal   Collection Time    02/14/13 10:14 AM      Result Value Range   Glucose-Capillary 164 (*) 70 - 99 mg/dL  CBC     Status: Abnormal   Collection Time    02/14/13 10:51 AM      Result Value Range   WBC 8.4  4.0 - 10.5 K/uL   RBC 3.63 (*) 4.22 - 5.81 MIL/uL   Hemoglobin 12.0 (*) 13.0 - 17.0 g/dL   HCT 16.1 (*) 09.6 - 04.5 %   MCV 94.8  78.0 - 100.0 fL   MCH 33.1  26.0 - 34.0 pg   MCHC 34.9  30.0 - 36.0 g/dL   RDW 40.9  81.1 - 91.4 %   Platelets 187  150 - 400 K/uL  BASIC METABOLIC PANEL     Status: Abnormal   Collection Time    02/14/13 10:51 AM      Result Value Range   Sodium 129 (*) 135 - 145 mEq/L   Potassium 4.4  3.5 - 5.1 mEq/L   Chloride 92 (*) 96 - 112 mEq/L   CO2 18 (*) 19 - 32 mEq/L   Glucose, Bld 131 (*) 70 - 99 mg/dL   BUN 14  6 - 23 mg/dL   Creatinine, Ser 7.82  0.50 - 1.35 mg/dL   Calcium 9.2  8.4 - 95.6 mg/dL   GFR calc non Af Amer 57 (*) >90 mL/min   GFR calc Af Amer 66 (*) >90 mL/min   Comment: (NOTE)     The eGFR has been calculated using the CKD EPI equation.     This calculation has not been validated in all clinical situations.     eGFR's persistently <90 mL/min signify possible Chronic Kidney     Disease.  HEPATIC FUNCTION PANEL     Status: Abnormal   Collection Time    02/14/13 10:51 AM  Result Value Range   Total Protein 6.9  6.0 - 8.3 g/dL   Albumin 3.9  3.5 - 5.2 g/dL   AST 41 (*) 0 - 37 U/L   ALT 21  0 - 53 U/L   Alkaline Phosphatase 74  39 - 117 U/L   Total Bilirubin 0.6  0.3 - 1.2 mg/dL   Bilirubin, Direct 0.2  0.0 - 0.3 mg/dL   Indirect Bilirubin 0.4  0.3 - 0.9 mg/dL  POCT I-STAT TROPONIN I     Status: None   Collection Time    02/14/13 10:57 AM      Result Value Range   Troponin i, poc 0.01  0.00 - 0.08 ng/mL   Comment 3            Comment: Due to the release kinetics of cTnI,     a negative result within the first hours     of the onset of symptoms does not rule out     myocardial  infarction with certainty.     If myocardial infarction is still suspected,     repeat the test at appropriate intervals.  URINALYSIS, ROUTINE W REFLEX MICROSCOPIC     Status: Abnormal   Collection Time    02/14/13 11:22 AM      Result Value Range   Color, Urine YELLOW  YELLOW   APPearance CLEAR  CLEAR   Specific Gravity, Urine 1.013  1.005 - 1.030   pH 6.5  5.0 - 8.0   Glucose, UA NEGATIVE  NEGATIVE mg/dL   Hgb urine dipstick NEGATIVE  NEGATIVE   Bilirubin Urine NEGATIVE  NEGATIVE   Ketones, ur 15 (*) NEGATIVE mg/dL   Protein, ur NEGATIVE  NEGATIVE mg/dL   Urobilinogen, UA 1.0  0.0 - 1.0 mg/dL   Nitrite NEGATIVE  NEGATIVE   Leukocytes, UA NEGATIVE  NEGATIVE   Comment: MICROSCOPIC NOT DONE ON URINES WITH NEGATIVE PROTEIN, BLOOD, LEUKOCYTES, NITRITE, OR GLUCOSE <1000 mg/dL.    Dg Chest 2 View  02/14/2013   CLINICAL DATA:  Chest pressure and numbness radiating into the right hand. Weakness.  EXAM: CHEST  2 VIEW  COMPARISON:  07/06/2012  FINDINGS: There is slight cardiomegaly. Pulmonary vascularity is normal and the lungs are clear. Prior CABG. Prominent left pericardial fat pad. No acute osseous abnormality.  IMPRESSION: No acute abnormalities.   Electronically Signed   By: Geanie Cooley   On: 02/14/2013 12:15   Ct Head Wo Contrast  02/14/2013   CLINICAL DATA:  Right-sided numbness  EXAM: CT HEAD WITHOUT CONTRAST  TECHNIQUE: Contiguous axial images were obtained from the base of the skull through the vertex without intravenous contrast.  COMPARISON:  MRI 10/20/2012  FINDINGS: The brain shows generalized atrophy. There are mild chronic small-vessel changes within the deep white matter. No sign of acute infarction, mass lesion, hemorrhage, hydrocephalus or extra-axial collection. Mega cisterna magna incidentally noted, not pathologic. No calvarial abnormality. Sinuses are clear.  IMPRESSION: No acute finding. Atrophy and chronic small vessel disease.   Electronically Signed   By: Paulina Fusi  M.D.   On: 02/14/2013 12:24    Assessment and plan discussed with with attending physician and they are in agreement.    Felicie Morn PA-C [facility] [phone]  02/14/2013, 1:42 PM     Assessment/Plan:  79 yo M with transient right face and arm numbness most consistent with TIA. I would favor a TIA workup.   1. HgbA1c, fasting lipid panel 2. MRI, MRA  of  the brain without contrast 3. Frequent neuro checks 4. Echocardiogram 5. Carotid dopplers 6. Prophylactic therapy-Antiplatelet med: Aspirin - dose 325mg  7. Risk factor modification 8. Telemetry monitoring 9. PT consult, OT consult, Speech consult   Ritta Slot, MD [facility] [phone]  If 7pm- 7am, please page neurology on call at [phone].

## 2013-02-14 NOTE — ED Notes (Signed)
Called report to [place], [place]

## 2013-02-14 NOTE — ED Provider Notes (Signed)
CSN: [id number]     Arrival date & time 02/14/13  1007 History   First MD Initiated Contact with Patient 02/14/13 1015     Chief Complaint  Patient presents with  . Weakness   (Consider location/radiation/quality/duration/timing/severity/associated sxs/prior Treatment) HPI Comments: 79 year old male presents with acute numbness to his right hand. He states it started on hour prior to arrival. Before he arrived at the hospital it had resolved. While he is having numbness and tingling in his hand he also started having similar symptoms in his right back. These symptoms all resolved before coming in. He did have 5 minutes of chest pressure just prior to arrival as well but this is also gone. Denies any headaches or focal weakness. Denies neck pain or been recently ill. Now he states he has some tingling in his toes but otherwise denies any new symptoms.  The history is provided by the patient and the spouse.    Past Medical History  Diagnosis Date  . Hypertension     benign  . Angina at rest   . Coronary artery disease   . Pneumonia 2014    x2  . Movement disorder   . Pneumonia and influenza      2 pneumonias within 2013.    Past Surgical History  Procedure Laterality Date  . Colonoscopy  2006/2008  . Coronary artery bypass graft  1995  . Lumbar laminectomy  1980  . Cataract extraction    . Electrocardiogram  2006  . Back surgery    . Bi pass      x5   Family History  Problem Relation Age of Onset  . Thyroid disease Sister   . Thyroid disease Other    History  Substance Use Topics  . Smoking status: Former Games developer  . Smokeless tobacco: Never Used     Comment: quit smoking 20-30 years ago  . Alcohol Use: Yes     Comment: 4-5 glasses of wine daily    Review of Systems  Constitutional: Negative for fever and chills.  Eyes: Negative for photophobia.  Respiratory: Negative for cough and shortness of breath.   Cardiovascular: Positive for chest pain (Transient).   Gastrointestinal: Negative for nausea, vomiting and diarrhea.  Neurological: Positive for numbness. Negative for syncope, speech difficulty, weakness and headaches.  All other systems reviewed and are negative.    Allergies  Review of patient's allergies indicates no known allergies.  Home Medications   Current Outpatient Rx  Name  Route  Sig  Dispense  Refill  . amLODipine (NORVASC) 5 MG tablet   Oral   Take 2.5 mg by mouth daily.          Marland Kitchen aspirin 325 MG tablet   Oral   Take 325 mg by mouth daily.         . B Complex-C (SUPER B COMPLEX PO)   Oral   Take 1 capsule by mouth daily.         Marland Kitchen ibuprofen (ADVIL,MOTRIN) 200 MG tablet   Oral   Take 600 mg by mouth every 6 (six) hours as needed for pain.         . metoprolol (LOPRESSOR) 50 MG tablet   Oral   Take 50 mg by mouth 2 (two) times daily.         Marland Kitchen omeprazole (PRILOSEC) 40 MG capsule   Oral   Take 40 mg by mouth 2 (two) times daily. Delayed release, once daily         .  rosuvastatin (CRESTOR) 20 MG tablet   Oral   Take 20 mg by mouth daily.         . valsartan (DIOVAN) 160 MG tablet   Oral   Take 160 mg by mouth daily.          BP 157/71  Pulse 110  Temp(Src) 98.2 F (36.8 C)  Resp 16  SpO2 97% Physical Exam  Nursing note and vitals reviewed. Constitutional: He is oriented to person, place, and time. He appears well-developed and well-nourished.  HENT:  Head: Normocephalic and atraumatic.  Right Ear: External ear normal.  Left Ear: External ear normal.  Nose: Nose normal.  Eyes: EOM are normal. Pupils are equal, round, and reactive to light. Right eye exhibits no discharge. Left eye exhibits no discharge.  Neck: Neck supple.  Cardiovascular: Normal rate, regular rhythm, normal heart sounds and intact distal pulses.   Pulmonary/Chest: Effort normal and breath sounds normal.  Abdominal: Soft. There is no tenderness.  Musculoskeletal: He exhibits no edema.  Neurological: He is alert  and oriented to person, place, and time. He has normal strength. No cranial nerve deficit or sensory deficit. GCS eye subscore is 4. GCS verbal subscore is 5. GCS motor subscore is 6.  Skin: Skin is warm and dry.    ED Course  Procedures (including critical care time) Labs Review Labs Reviewed  GLUCOSE, CAPILLARY - Abnormal; Notable for the following:    Glucose-Capillary 164 (*)    All other components within normal limits  CBC - Abnormal; Notable for the following:    RBC 3.63 (*)    Hemoglobin 12.0 (*)    HCT 34.4 (*)    All other components within normal limits  BASIC METABOLIC PANEL - Abnormal; Notable for the following:    Sodium 129 (*)    Chloride 92 (*)    CO2 18 (*)    Glucose, Bld 131 (*)    GFR calc non Af Amer 57 (*)    GFR calc Af Amer 66 (*)    All other components within normal limits  HEPATIC FUNCTION PANEL - Abnormal; Notable for the following:    AST 41 (*)    All other components within normal limits  URINALYSIS, ROUTINE W REFLEX MICROSCOPIC - Abnormal; Notable for the following:    Ketones, ur 15 (*)    All other components within normal limits  URINE RAPID DRUG SCREEN (HOSP PERFORMED)  ETHANOL  HEMOGLOBIN A1C  TROPONIN I  TROPONIN I  TROPONIN I  VITAMIN B12  POCT I-STAT TROPONIN I    Date: 02/14/2013  Rate: 112  Rhythm: sinus tachycardia  QRS Axis: normal  Intervals: normal  ST/T Wave abnormalities: nonspecific ST/T changes  Conduction Disutrbances:right bundle branch block  Narrative Interpretation:   Old EKG Reviewed: none available   Imaging Review Dg Chest 2 View  02/14/2013   CLINICAL DATA:  Chest pressure and numbness radiating into the right hand. Weakness.  EXAM: CHEST  2 VIEW  COMPARISON:  07/06/2012  FINDINGS: There is slight cardiomegaly. Pulmonary vascularity is normal and the lungs are clear. Prior CABG. Prominent left pericardial fat pad. No acute osseous abnormality.  IMPRESSION: No acute abnormalities.   Electronically Signed    By: Geanie Cooley   On: 02/14/2013 12:15   Ct Head Wo Contrast  02/14/2013   CLINICAL DATA:  Right-sided numbness  EXAM: CT HEAD WITHOUT CONTRAST  TECHNIQUE: Contiguous axial images were obtained from the base of the skull through the vertex  without intravenous contrast.  COMPARISON:  MRI 10/20/2012  FINDINGS: The brain shows generalized atrophy. There are mild chronic small-vessel changes within the deep white matter. No sign of acute infarction, mass lesion, hemorrhage, hydrocephalus or extra-axial collection. Mega cisterna magna incidentally noted, not pathologic. No calvarial abnormality. Sinuses are clear.  IMPRESSION: No acute finding. Atrophy and chronic small vessel disease.   Electronically Signed   By: Paulina Fusi M.D.   On: 02/14/2013 12:24     MDM   1. Numbness   2. Alcohol abuse, daily use   3. Chest pain   4. Hyponatremia    Patient had no transient numbness and transient chest pain. The transient numbness is concerning for a TIA. Discussed with neurology who agrees with TIA workup. Will put patient in observation for both serial troponins and TIA testing. It is unlikely this short episode of chest pain was ACS, but will get serial troponins.    Audree Camel, MD 02/14/13 [phone]

## 2013-02-15 ENCOUNTER — Observation Stay (HOSPITAL_COMMUNITY): Payer: Medicare Other

## 2013-02-15 DIAGNOSIS — G459 Transient cerebral ischemic attack, unspecified: Secondary | ICD-10-CM | POA: Diagnosis not present

## 2013-02-15 DIAGNOSIS — R42 Dizziness and giddiness: Secondary | ICD-10-CM | POA: Diagnosis not present

## 2013-02-15 DIAGNOSIS — F1010 Alcohol abuse, uncomplicated: Secondary | ICD-10-CM

## 2013-02-15 DIAGNOSIS — R209 Unspecified disturbances of skin sensation: Secondary | ICD-10-CM

## 2013-02-15 DIAGNOSIS — R079 Chest pain, unspecified: Secondary | ICD-10-CM

## 2013-02-15 DIAGNOSIS — IMO0001 Reserved for inherently not codable concepts without codable children: Secondary | ICD-10-CM | POA: Diagnosis not present

## 2013-02-15 DIAGNOSIS — E871 Hypo-osmolality and hyponatremia: Secondary | ICD-10-CM

## 2013-02-15 LAB — LIPID PANEL
Cholesterol: 132 mg/dL (ref 0–200)
HDL: 70 mg/dL (ref >39)
Total CHOL/HDL Ratio: 1.9 RATIO
VLDL: 17 mg/dL (ref 0–40)

## 2013-02-15 LAB — CBC
Hemoglobin: 11.2 g/dL — ABNORMAL LOW (ref 13.0–17.0)
MCH: 33.8 pg (ref 26.0–34.0)
MCV: 95.5 fL (ref 78.0–100.0)
Platelets: 178 10*3/uL (ref 150–400)
RBC: 3.31 MIL/uL — ABNORMAL LOW (ref 4.22–5.81)

## 2013-02-15 LAB — TROPONIN I: Troponin I: 0.68 ng/mL (ref <0.30)

## 2013-02-15 LAB — BASIC METABOLIC PANEL
CO2: 25 mEq/L (ref 19–32)
Calcium: 9.4 mg/dL (ref 8.4–10.5)
Chloride: 97 mEq/L (ref 96–112)
Creatinine, Ser: 1.16 mg/dL (ref 0.50–1.35)
Glucose, Bld: 106 mg/dL — ABNORMAL HIGH (ref 70–99)
Sodium: 132 mEq/L — ABNORMAL LOW (ref 135–145)

## 2013-02-15 LAB — GLUCOSE, CAPILLARY
Glucose-Capillary: 113 mg/dL — ABNORMAL HIGH (ref 70–99)
Glucose-Capillary: 127 mg/dL — ABNORMAL HIGH (ref 70–99)
Glucose-Capillary: 110 mg/dL — ABNORMAL HIGH (ref 70–99)

## 2013-02-15 MED ORDER — LORAZEPAM 1 MG PO TABS
1.0000 mg | ORAL_TABLET | Freq: Once | ORAL | Status: AC
  Administered 2013-02-15: 1 mg via ORAL
  Filled 2013-02-15: qty 1

## 2013-02-15 MED ORDER — CLOPIDOGREL BISULFATE 75 MG PO TABS: 75.0000 mg | ORAL_TABLET | Freq: Every day | ORAL | Status: DC

## 2013-02-15 NOTE — Progress Notes (Addendum)
Shift event: Earlier, RN paged this NP with critical troponin result of .72.  Upon admission, pt's troponin was < .30. This NP ordered another EKG to compare to one done earlier on 02/14/13. This NP had reviewed pt's meds and found him to already be on a BB, statin, and 325mg  of ASA. Given pt had BBB on EKG and it was difficult to interpret, this NP took EKGs to Dr. Conley Rolls from [facility] for compare. Dr. Conley Rolls thought pt had some Twave inversions on this EKG compared to one done on admission. RN called with update and O2 at 2L placed on pt. Pt was still chest pain free.   This NP then called cardio fellow. Dr. Chesterfield Callas reviewed chart and compared EKGs. He stated possible changes could seem evident now because pt's HR was tachy this am with Rt BBB making it difficult to see. There are no older EKGs available for comparison. Advised, since pt not having CP, to continue present tx regimen and await MRI results. If not neuro event, may want to consult cardio in am. If neuro event, would need to treat this first prior to thinking about cardio testing.  Next troponin due around 3-4am. Will follow. RN instructed to call NP should pt have any chest pain. Clinton Norman, NP [facility] Addendum: last troponin trended downward. D/c O2.

## 2013-02-15 NOTE — Discharge Summary (Signed)
Physician Discharge Summary  Clinton Hughes UJW:[id number] DOB: 13-Nov-1925 DOA: 02/14/2013  PCP: Miguel Aschoff, MD  Admit date: 02/14/2013 Discharge date: 02/15/2013  Discharge Diagnoses:   TIA    Hyponatremia    Alcohol abuse, daily use    Chest pain   Discharge Condition: stable  History of present illness:  HPI: Clinton Hughes is a 79 y.o. male  Who comes in c/o several symptoms. First was the first 3 fingers, his right sided back and right side of his face. This has all resolved. He also had an episode of dizziness when he got out of his car yesterday. No SOB, mild chest tightness. No fever no chills. He does drink a significant amount of wine/day- 3+, usually starting in the early morning. He did drink less alcohol yesterday.  In the past, he has been worked up by neurology for tremor- last MRI was in June 2014.  In the Er, he was found to have a low Na 129. Ct scan of brain was ok but neuro was consulted to r/o TIA.   Hospital Course:   On telemetry. Neurology recommended changing aspirin to clopidogrel for secondary stroke prevention. Patient was counseled to moderate or abstain from alcohol. He did have some slight tremulousness which resolved with Ativan.  Procedures:  none  Consultations:  neuro  Discharge Exam: Filed Vitals:   02/15/13 1604  BP: 181/76  Pulse: 65  Temp: 98.1 F (36.7 C)  Resp: 16    General: alert oriented. Slight tremulousness. appropriate Cardiovascular: RRR without MGR Respiratory: CTA without WRR Neuro:  Cranial nerves, sensorimotor exam intact  Discharge Instructions   Future Appointments Provider Department Dept Phone   04/09/2013 1:15 PM Sherrie George, MD [facility] [phone]   07/17/2013 3:00 PM Melvyn Novas, MD [facility] [phone]       Medication List    STOP taking these medications       aspirin 325 MG tablet      TAKE these medications       amLODipine  5 MG tablet  Commonly known as:  NORVASC  Take 2.5 mg by mouth daily.     clopidogrel 75 MG tablet  Commonly known as:  PLAVIX  Take 1 tablet (75 mg total) by mouth daily.     CRESTOR 20 MG tablet  Generic drug:  rosuvastatin  Take 20 mg by mouth daily.     DIOVAN 160 MG tablet  Generic drug:  valsartan  Take 160 mg by mouth daily.     ibuprofen 200 MG tablet  Commonly known as:  ADVIL,MOTRIN  Take 600 mg by mouth every 6 (six) hours as needed for pain.     metoprolol 50 MG tablet  Commonly known as:  LOPRESSOR  Take 50 mg by mouth 2 (two) times daily.     omeprazole 40 MG capsule  Commonly known as:  PRILOSEC  Take 40 mg by mouth 2 (two) times daily. Delayed release, once daily     SUPER B COMPLEX PO  Take 1 capsule by mouth daily.       No Known Allergies     Follow-up Information   Follow up with [facility], MD In 2 weeks.   Specialty:  Obstetrics and Gynecology   Contact information:   [address] [place] [address] [phone]        The results of significant diagnostics from this hospitalization (including imaging, microbiology, ancillary and laboratory) are  listed below for reference.    Significant Diagnostic Studies: Dg Chest 2 View  02/14/2013   CLINICAL DATA:  Chest pressure and numbness radiating into the right hand. Weakness.  EXAM: CHEST  2 VIEW  COMPARISON:  07/06/2012  FINDINGS: There is slight cardiomegaly. Pulmonary vascularity is normal and the lungs are clear. Prior CABG. Prominent left pericardial fat pad. No acute osseous abnormality.  IMPRESSION: No acute abnormalities.   Electronically Signed   By: Geanie Cooley   On: 02/14/2013 12:15   Ct Head Wo Contrast  02/14/2013   CLINICAL DATA:  Right-sided numbness  EXAM: CT HEAD WITHOUT CONTRAST  TECHNIQUE: Contiguous axial images were obtained from the base of the skull through the vertex without intravenous contrast.  COMPARISON:  MRI 10/20/2012  FINDINGS: The brain  shows generalized atrophy. There are mild chronic small-vessel changes within the deep white matter. No sign of acute infarction, mass lesion, hemorrhage, hydrocephalus or extra-axial collection. Mega cisterna magna incidentally noted, not pathologic. No calvarial abnormality. Sinuses are clear.  IMPRESSION: No acute finding. Atrophy and chronic small vessel disease.   Electronically Signed   By: Paulina Fusi M.D.   On: 02/14/2013 12:24   Mri Brain Without Contrast  02/15/2013   CLINICAL DATA:  TIA. Episode of right upper extremity and facial numbness which has resolved. Episode of dizziness yesterday well exiting his car.  EXAM: MRI HEAD WITHOUT CONTRAST  MRA HEAD WITHOUT CONTRAST  TECHNIQUE: Multiplanar, multiecho pulse sequences of the brain and surrounding structures were obtained without intravenous contrast. Angiographic images of the head were obtained using MRA technique without contrast.  COMPARISON:  CT head without contrast 02/14/2013. MRI brain without contrast 10/20/2012 at [facility]  FINDINGS: MRI HEAD FINDINGS  The diffusion-weighted images demonstrate no evidence for acute or subacute infarction. Advanced atrophy is present. Minimal white matter changes are noted bilaterally without significant change. Flow is present in the major intracranial arteries. The globes and orbits are intact. Circumferential mucosal disease of the maxillary sinuses is worse left than right. This is improved. There is some chronic fluid in the mastoid air cells bilaterally, left greater than right. No obstructing nasopharyngeal lesion is evident.  MRA HEAD FINDINGS  The internal carotid arteries are within normal limits from the high cervical segments through the ICA termini bilaterally. The A1 and M1 segments are normal. No definite anterior communicating artery is present. The MCA bifurcations are within normal limits. There is mild attenuation of distal MCA branch vessels.  The right vertebral artery is  slightly dominant to the left. The left PICA origin is just below the field of view. The right AICA vessels are dominant. Both posterior cerebral arteries are of fetal type. There is mild attenuation of distal PCA branch vessels bilaterally.  IMPRESSION: MRI HEAD IMPRESSION  1. No acute intracranial abnormality. 2. Advanced atrophy with relatively little white matter disease. This is likely related age. 3. Improved maxillary sinus disease. 4. Chronic mastoid effusions without evidence for nasopharyngeal obstruction.  MRA HEAD IMPRESSION  1. Mild distal small vessel disease as expected for age. 2. No significant proximal stenosis, aneurysm, or branch vessel occlusion.   Electronically Signed   By: Gennette Pac   On: 02/15/2013 10:57   Mr Maxine Glenn Head/brain Wo Cm  02/15/2013   CLINICAL DATA:  TIA. Episode of right upper extremity and facial numbness which has resolved. Episode of dizziness yesterday well exiting his car.  EXAM: MRI HEAD WITHOUT CONTRAST  MRA HEAD WITHOUT CONTRAST  TECHNIQUE: Multiplanar,  multiecho pulse sequences of the brain and surrounding structures were obtained without intravenous contrast. Angiographic images of the head were obtained using MRA technique without contrast.  COMPARISON:  CT head without contrast 02/14/2013. MRI brain without contrast 10/20/2012 at [facility]  FINDINGS: MRI HEAD FINDINGS  The diffusion-weighted images demonstrate no evidence for acute or subacute infarction. Advanced atrophy is present. Minimal white matter changes are noted bilaterally without significant change. Flow is present in the major intracranial arteries. The globes and orbits are intact. Circumferential mucosal disease of the maxillary sinuses is worse left than right. This is improved. There is some chronic fluid in the mastoid air cells bilaterally, left greater than right. No obstructing nasopharyngeal lesion is evident.  MRA HEAD FINDINGS  The internal carotid arteries are within normal  limits from the high cervical segments through the ICA termini bilaterally. The A1 and M1 segments are normal. No definite anterior communicating artery is present. The MCA bifurcations are within normal limits. There is mild attenuation of distal MCA branch vessels.  The right vertebral artery is slightly dominant to the left. The left PICA origin is just below the field of view. The right AICA vessels are dominant. Both posterior cerebral arteries are of fetal type. There is mild attenuation of distal PCA branch vessels bilaterally.  IMPRESSION: MRI HEAD IMPRESSION  1. No acute intracranial abnormality. 2. Advanced atrophy with relatively little white matter disease. This is likely related age. 3. Improved maxillary sinus disease. 4. Chronic mastoid effusions without evidence for nasopharyngeal obstruction.  MRA HEAD IMPRESSION  1. Mild distal small vessel disease as expected for age. 2. No significant proximal stenosis, aneurysm, or branch vessel occlusion.   Electronically Signed   By: Gennette Pac   On: 02/15/2013 10:57    Microbiology: No results found for this or any previous visit (from the past 240 hour(s)).   Labs: Basic Metabolic Panel:  Recent Labs Lab 02/14/13 1051 02/15/13 0608  NA 129* 132*  K 4.4 4.7  CL 92* 97  CO2 18* 25  GLUCOSE 131* 106*  BUN 14 14  CREATININE 1.14 1.16  CALCIUM 9.2 9.4   Liver Function Tests:  Recent Labs Lab 02/14/13 1051  AST 41*  ALT 21  ALKPHOS 74  BILITOT 0.6  PROT 6.9  ALBUMIN 3.9   No results found for this basename: LIPASE, AMYLASE,  in the last 168 hours No results found for this basename: AMMONIA,  in the last 168 hours CBC:  Recent Labs Lab 02/14/13 1051 02/15/13 0608  WBC 8.4 8.5  HGB 12.0* 11.2*  HCT 34.4* 31.6*  MCV 94.8 95.5  PLT 187 178   Cardiac Enzymes:  Recent Labs Lab 02/14/13 1530 02/14/13 2057 02/15/13 0310 02/15/13 1205  TROPONINI <0.30 0.72* 0.68* <0.30   BNP: BNP (last 3 results) No results  found for this basename: PROBNP,  in the last 8760 hours CBG:  Recent Labs Lab 02/14/13 1616 02/14/13 2043 02/15/13 0748 02/15/13 1201 02/15/13 1633  GLUCAP 119* 114* 113* 127* 110*   EKG:  ST with RBBB  Echo Left ventricle: The cavity size was normal. Systolic function was normal. The estimated ejection fraction was in the range of 55% to 60%. Wall motion was normal; there were no regional wall motion abnormalities. - Left atrium: The atrium was severely dilated. Impressions:  - No cardiac source of embolism was identified, but cannot be ruled out on the basis of this examination.  SignedChristiane Ha  [facility] 02/15/2013, 5:52  PM

## 2013-02-15 NOTE — Evaluation (Signed)
Physical Therapy Evaluation Patient Details Name: Clinton Hughes MRN: [id number] DOB: [birth year] Today's Date: 02/15/2013 Time: 1151-1203 PT Time Calculation (min): 12 min  PT Assessment / Plan / Recommendation History of Present Illness  Who comes in c/o several symptoms.  First was the first 3 fingers, his right sided back and right side of his face.  This has all resolved.  He also had an episode of dizziness when he got out of his car yesterday.  No SOB, mild chest tightness.  No fever no chills.  He does drink a significant amount of wine/day- 3+, usually starting in the early morning.  He did drink less alcohol yesterday  Clinical Impression  Pt without symptoms at this time. Pt with 5/5 strength bil UE and bil LE without deficit in sensation, balance or mobility. Pt and spouse agree pt has returned to baseline functional status of being independent and does not warrant further therapy at this time. Will sign off.    PT Assessment  Patent does not need any further PT services    Follow Up Recommendations  No PT follow up    Does the patient have the potential to tolerate intense rehabilitation      Barriers to Discharge        Equipment Recommendations  None recommended by PT    Recommendations for Other Services     Frequency      Precautions / Restrictions Precautions Precautions: None   Pertinent Vitals/Pain No pain VSS      Mobility  Bed Mobility Bed Mobility: Supine to Sit Supine to Sit: 6: Modified independent (Device/Increase time) Transfers Transfers: Sit to Stand;Stand to Sit Sit to Stand: 6: Modified independent (Device/Increase time);From bed Stand to Sit: 6: Modified independent (Device/Increase time);To chair/3-in-1 Ambulation/Gait Ambulation/Gait Assistance: 7: Independent Ambulation Distance (Feet): 400 Feet Assistive device: None Gait Pattern: Within Functional Limits Gait velocity: WFL Stairs: Yes Stairs Assistance: 6: Modified  independent (Device/Increase time) Stair Management Technique: One rail Right Number of Stairs: 12 Modified Rankin (Stroke Patients Only) Pre-Morbid Rankin Score: No symptoms Modified Rankin: No symptoms    Exercises     PT Diagnosis:    PT Problem List:   PT Treatment Interventions:       PT Goals(Current goals can be found in the care plan section) Acute Rehab PT Goals PT Goal Formulation: No goals set, d/c therapy  Visit Information  Last PT Received On: 02/15/13 Assistance Needed: +1 History of Present Illness: Who comes in c/o several symptoms.  First was the first 3 fingers, his right sided back and right side of his face.  This has all resolved.  He also had an episode of dizziness when he got out of his car yesterday.  No SOB, mild chest tightness.  No fever no chills.  He does drink a significant amount of wine/day- 3+, usually starting in the early morning.  He did drink less alcohol yesterday       Prior Functioning  Home Living Family/patient expects to be discharged to:: Private residence Living Arrangements: Spouse/significant other Available Help at Discharge: Family;Available 24 hours/day Type of Home: House Home Access: Stairs to enter [employer] of Steps: 2 Home Layout: Two level;Bed/bath upstairs Alternate Level Stairs-Number of Steps: 12 Alternate Level Stairs-Rails: Right Home Equipment: None Prior Function Level of Independence: Independent Communication Communication: No difficulties    Cognition  Cognition Arousal/Alertness: Awake/alert Behavior During Therapy: WFL for tasks assessed/performed Overall Cognitive Status: Within Functional Limits for tasks assessed  Extremity/Trunk Assessment Upper Extremity Assessment Upper Extremity Assessment: Overall WFL for tasks assessed Lower Extremity Assessment Lower Extremity Assessment: Overall WFL for tasks assessed Cervical / Trunk Assessment Cervical / Trunk Assessment: Normal    Balance    End of Session PT - End of Session Activity Tolerance: Patient tolerated treatment well Patient left: in chair;with call bell/phone within reach;with nursing/sitter in room;with family/visitor present  GP     Toney Sang Beth 02/15/2013, 12:05 PM Delaney Meigs, PT [phone]

## 2013-02-15 NOTE — Progress Notes (Signed)
  Echocardiogram 2D Echocardiogram has been performed.  Arvil Chaco 02/15/2013, 2:48 PM

## 2013-02-15 NOTE — Progress Notes (Signed)
CRITICAL VALUE ALERT  Critical value received:  0.72 troponin  Date of notification:  02/14/2013  Time of notification:  22:11  Critical value read back:yes  Nurse who received alert:  Ricky Stabs  MD notified (1st page):  Donnamarie Poag  Time of first page:  22:15  MD notified (2nd page):  Time of second page:  Responding MD:  Donnamarie Poag  Time MD responded:  22:45 new order for EKG

## 2013-02-15 NOTE — Progress Notes (Signed)
O2 2 liters started per MD order related to ischemia.

## 2013-02-15 NOTE — Progress Notes (Signed)
Stroke Team Progress Note  HISTORY Clinton Hughes is an 79 y.o. male who is seen by [facility] for tremor and possible parkinson's. Patient was doing well this am but noted to have right hand middle, ring and small finger numbess. This then migrated to his right lower back and his right lips.This lasted about 15 mintues. Speech problems on the way over the hospital for only about 15 minutes. At present time he has no tingling and all his symptoms have resolved. There was some mention of bilateral LE paresthesia but this is a chronic problem. Currently his sodium is 129 but calcium is normal. Per wife he drinks a significant amount of ETOH daily (">4 glasses of wine on a low intake day").   He staets that it was a numbness, not a tingling  Patient was not a TPA candidate secondary to resolution of symptoms. He was admitted for further evaluation and treatment.  SUBJECTIVE Lying in bed.  Overall he feels his condition is completely resolved.   OBJECTIVE Most recent Vital Signs: Filed Vitals:   02/14/13 2000 02/14/13 2150 02/15/13 0000 02/15/13 0346  BP: 179/74 154/67 161/63 169/63  Pulse: 73 68 64 63  Temp: 98.6 F (37 C)  98.5 F (36.9 C) 98.4 F (36.9 C)  TempSrc: Oral  Oral Oral  Resp: 18  18 18   SpO2: 97%  96% 96%   CBG (last 3)   Recent Labs  02/14/13 1014 02/14/13 1616 02/14/13 2043  GLUCAP 164* 119* 114*    IV Fluid Intake:     MEDICATIONS  . amLODipine  2.5 mg Oral Daily  . aspirin  325 mg Oral Daily  . atorvastatin  40 mg Oral q1800  . enoxaparin (LOVENOX) injection  40 mg Subcutaneous Q24H  . folic acid  1 mg Oral Daily  . irbesartan  150 mg Oral Daily  . metoprolol  50 mg Oral BID  . multivitamin with minerals  1 tablet Oral Daily  . pantoprazole  80 mg Oral Daily  . thiamine  100 mg Oral Daily   Or  . thiamine  100 mg Intravenous Daily   PRN:  LORazepam, LORazepam Diet:  Carb Control thin liquids Activity:  Bathroom privileges with assist DVT Prophylaxis:   lovenox  CLINICALLY SIGNIFICANT STUDIES Basic Metabolic Panel:  Recent Labs Lab 02/14/13 1051 02/15/13 0608  NA 129* 132*  K 4.4 4.7  CL 92* 97  CO2 18* 25  GLUCOSE 131* 106*  BUN 14 14  CREATININE 1.14 1.16  CALCIUM 9.2 9.4   Liver Function Tests:  Recent Labs Lab 02/14/13 1051  AST 41*  ALT 21  ALKPHOS 74  BILITOT 0.6  PROT 6.9  ALBUMIN 3.9   CBC:  Recent Labs Lab 02/14/13 1051 02/15/13 0608  WBC 8.4 8.5  HGB 12.0* 11.2*  HCT 34.4* 31.6*  MCV 94.8 95.5  PLT 187 178   Coagulation: No results found for this basename: LABPROT, INR,  in the last 168 hours Cardiac Enzymes:  Recent Labs Lab 02/14/13 1530 02/14/13 2057 02/15/13 0310  TROPONINI <0.30 0.72* 0.68*   Urinalysis:  Recent Labs Lab 02/14/13 1122  COLORURINE YELLOW  LABSPEC 1.013  PHURINE 6.5  GLUCOSEU NEGATIVE  HGBUR NEGATIVE  BILIRUBINUR NEGATIVE  KETONESUR 15*  PROTEINUR NEGATIVE  UROBILINOGEN 1.0  NITRITE NEGATIVE  LEUKOCYTESUR NEGATIVE   Lipid Panel    Component Value Date/Time   CHOL 132 02/15/2013 0608   TRIG 83 02/15/2013 0608   HDL 70 02/15/2013 1610  CHOLHDL 1.9 02/15/2013 0608   VLDL 17 02/15/2013 0608   LDLCALC 45 02/15/2013 0608   HgbA1C  Lab Results  Component Value Date   HGBA1C 5.6 02/14/2013    Urine Drug Screen:     Component Value Date/Time   LABOPIA NONE DETECTED 02/14/2013 1122   COCAINSCRNUR NONE DETECTED 02/14/2013 1122   LABBENZ NONE DETECTED 02/14/2013 1122   AMPHETMU NONE DETECTED 02/14/2013 1122   THCU NONE DETECTED 02/14/2013 1122   LABBARB NONE DETECTED 02/14/2013 1122    Alcohol Level:  Recent Labs Lab 02/14/13 1412  ETH <11    Dg Chest 2 View 02/14/2013  No acute abnormalities.  Ct Head Wo Contrast 02/14/2013 : No acute finding. Atrophy and chronic small vessel disease.     MRI of the brain   1. No acute intracranial abnormality.  2. Advanced atrophy with relatively little white matter disease.  This is likely related age.  3. Improved  maxillary sinus disease.  4. Chronic mastoid effusions without evidence for nasopharyngeal  obstruction.   MRA of the brain   1. Mild distal small vessel disease as expected for age.  2. No significant proximal stenosis, aneurysm, or branch vessel  occlusion  2D Echocardiogram    Carotid Doppler  Bilateral: 1-39% ICA stenosis. Vertebral artery flow is antegrade  CXR    EKG  normal sinus rhythm.   Therapy Recommendations none  Physical Exam   Mental Status:  Alert, oriented, thought content appropriate. Speech fluent without evidence of aphasia. Able to follow 3 step commands without difficulty.  Cranial Nerves:  II: Discs flat bilaterally; Visual fields grossly normal, pupils equal, round, reactive to light and accommodation  III,IV, VI: ptosis not present, extra-ocular motions intact bilaterally  V,VII: smile symmetric, facial light touch sensation normal bilaterally  VIII: hearing normal bilaterally  IX,X: gag reflex present  XI: bilateral shoulder shrug  XII: midline tongue extension  Motor:  Right : Upper extremity 5/5 Left: Upper extremity 5/5  Lower extremity 5/5 Lower extremity 5/5  --postural and intentional tremor  Tone and bulk:normal tone throughout; no atrophy noted  Sensory: Pinprick and light touch intact throughout, bilaterally. Decreased vibratory sensation up to knees and temperature up to mid calf.  Deep Tendon Reflexes:  Right: Upper Extremity Left: Upper extremity  biceps (C-5 to C-6) 2/4 biceps (C-5 to C-6) 2/4  tricep (C7) 2/4 triceps (C7) 2/4  Brachioradialis (C6) 2/4 Brachioradialis (C6) 2/4  Lower Extremity Lower Extremity  quadriceps (L-2 to L-4) 2/4 quadriceps (L-2 to L-4) 2/4  Achilles (S1) 2/4 Achilles (S1) 2/4  Plantars:  Right: downgoing Left: downgoing  Cerebellar:  normal finger-to-nose, normal heel-to-shin test  Gait: not tested  CV: pulses palpable throughout    ASSESSMENT [name] is a 79 y.o. male presenting with  right hemi-sensory loss, aphasia that was transient for 15 minutes. MRI Imaging confirms no acute infarct visible on MRI.  On aspirin 325 mg orally every day prior to admission. Now on aspirin 325 mg orally every day for secondary stroke prevention. Patient with resultant transient symptoms, now resolved.   Transient ischemic attack  LDL 45, at goal on statin therapy  HGB A1C, 5.6  Hypertension  Coronary artery disease  Hospital day # 1  TREATMENT/PLAN  Change to  clopidogrel 75 mg orally every day for secondary stroke prevention.  Echo pending, if no cardiac source identified, can be discharged.  Follow up with Dr. Marylou Flesher as outpatient as she was already seeing him for Parkinson's  workup  Gwendolyn Lima. Manson Passey, [facility], MBA, MHA [facility] Pager: [phone] 02/15/2013 3:20 PM  I have personally obtained a history, examined the patient, evaluated imaging results, and formulated the assessment and plan of care. I agree with the above. Delia Heady, MD

## 2013-02-15 NOTE — Progress Notes (Signed)
EKG performed K. Craige Cotta notified of results no new orders.

## 2013-02-15 NOTE — Progress Notes (Signed)
UR Completed Saliah Crisp Graves-Bigelow, RN,BSN [phone]  

## 2013-02-15 NOTE — Progress Notes (Signed)
Discharge instructions reviewed with pt and wife. Pt has no complaints at this time. IV and tele box removed.

## 2013-02-16 LAB — FOLATE RBC: RBC Folate: 2374 ng/mL — ABNORMAL HIGH (ref >=366)

## 2013-02-21 DIAGNOSIS — R1310 Dysphagia, unspecified: Secondary | ICD-10-CM | POA: Diagnosis not present

## 2013-02-21 DIAGNOSIS — K5900 Constipation, unspecified: Secondary | ICD-10-CM | POA: Diagnosis not present

## 2013-02-26 DIAGNOSIS — G459 Transient cerebral ischemic attack, unspecified: Secondary | ICD-10-CM | POA: Diagnosis not present

## 2013-03-02 ENCOUNTER — Ambulatory Visit (INDEPENDENT_AMBULATORY_CARE_PROVIDER_SITE_OTHER): Payer: Medicare Other | Admitting: Neurology

## 2013-03-02 ENCOUNTER — Telehealth: Payer: Self-pay | Admitting: *Deleted

## 2013-03-02 ENCOUNTER — Encounter: Payer: Self-pay | Admitting: Neurology

## 2013-03-02 VITALS — BP 121/66 | HR 68 | Resp 14 | Ht 68.0 in | Wt 200.0 lb

## 2013-03-02 DIAGNOSIS — G459 Transient cerebral ischemic attack, unspecified: Secondary | ICD-10-CM

## 2013-03-02 DIAGNOSIS — I119 Hypertensive heart disease without heart failure: Secondary | ICD-10-CM | POA: Diagnosis not present

## 2013-03-02 DIAGNOSIS — E785 Hyperlipidemia, unspecified: Secondary | ICD-10-CM | POA: Diagnosis not present

## 2013-03-02 DIAGNOSIS — E871 Hypo-osmolality and hyponatremia: Secondary | ICD-10-CM | POA: Diagnosis not present

## 2013-03-02 DIAGNOSIS — I209 Angina pectoris, unspecified: Secondary | ICD-10-CM | POA: Diagnosis not present

## 2013-03-02 DIAGNOSIS — R55 Syncope and collapse: Secondary | ICD-10-CM | POA: Diagnosis not present

## 2013-03-02 DIAGNOSIS — IMO0001 Reserved for inherently not codable concepts without codable children: Secondary | ICD-10-CM | POA: Diagnosis not present

## 2013-03-02 DIAGNOSIS — Z79899 Other long term (current) drug therapy: Secondary | ICD-10-CM | POA: Diagnosis not present

## 2013-03-02 DIAGNOSIS — I951 Orthostatic hypotension: Secondary | ICD-10-CM | POA: Diagnosis not present

## 2013-03-02 DIAGNOSIS — Z951 Presence of aortocoronary bypass graft: Secondary | ICD-10-CM | POA: Diagnosis not present

## 2013-03-02 DIAGNOSIS — I2510 Atherosclerotic heart disease of native coronary artery without angina pectoris: Secondary | ICD-10-CM | POA: Diagnosis not present

## 2013-03-02 HISTORY — DX: Transient cerebral ischemic attack, unspecified: G45.9

## 2013-03-02 MED ORDER — CLONAZEPAM 0.5 MG PO TABS: 0.2500 mg | ORAL_TABLET | Freq: Every evening | ORAL | Status: DC | PRN

## 2013-03-02 MED ORDER — CLOPIDOGREL BISULFATE 75 MG PO TABS: 75.0000 mg | ORAL_TABLET | Freq: Every day | ORAL | Status: DC

## 2013-03-02 NOTE — Telephone Encounter (Signed)
Cathy at Dr. York Spaniel office called to request TEE to be scheduled.

## 2013-03-02 NOTE — Telephone Encounter (Signed)
I have scheduled this pt for a TEE on 03/08/13 with Dr Tenny Craw. I left a message on the pt's home answering machine for the pt to call our office for instructions.

## 2013-03-02 NOTE — Progress Notes (Addendum)
[facility]  Provider:  Melvyn Novas, M D  Referring Provider: Miguel Aschoff, MD Primary Care Physician:  Miguel Aschoff, MD  Chief Complaint  Patient presents with  . NP/ TIA    Pt is having possible numbness as well as speech issues    HPI:  Mr. Heffern was just hospitalized at [facility] as of October 2014 for recurrent spells, possible TIAs.   He suffered a total of 6 spells meanwhile. His cardiologist Dr. Viann Fish , reported that while he was  In office with the patient, the patient  reported having had 2 more spells since discharge from the hospital.  These spells  Are associated with speech arrest, facial numbness, and hand numbness -  dysesthesias off pins and needles sensations usually on the left side, left hand . His wife noted he can" walk out of this spells" , they seem to resolve quicker when he keeps moving.  On Saturday he responded to a phone call, but was apraxic as he tried to respond , had not  lifted the receiver -  Did not know what to do with the ringing phone.  He could not see the dials, did not find the Talk button and had speech arrest when trying to communicate with his spouse.  Neurology was consulted , and noted some tremors , these  resolved with Ativan.  The patient was also changed from aspirin which he took until October ,  to the generic form of Plavix.  This was meant for secondary stroke prevention , but again , a primary stroke was not identified -so far all spells have been temporary with complete resolution .  Review of the MRI showed no intercranial abnormalities but some advanced atrophy,  This finding is likely age related.  Troponin levels were normal, liver function tests are normal, CBC  was normal - he was a slight bit anemic.  His sodium level was reduced to 129- ( normal as considered by the hospital lab  are levels of 135 or above) .     Aphasia and apraxia. The patient is scheduled  for a transesophageal  echocardiogram,  he had only trans- thoracic echocardiogram in hospital. Appointment with [facility] at 03-08-13 .  MRI in June 2014 was unremarkable, and again another brain  MRI  in the emergency room on 02/15/2013 . He was found to have a low sodium level of 129 mmol  which may have contributed to his confusion.  As the antoni stefan remarked that he is a good sleeper,  has a normal appetite- is not quite sure what to do with the borderline low sodium and hold much fluid restriction per dilution.  He states that he has to get up several night several times at night to go to the bathroom but his sleep overall has a good restorative quality.  There is normal reported parasomnia sleepwalking or acting out dreams at this time. He is concerned about driving - should not drive for several weeks until we have more insight into PLAVIX action.  I would like him to use a low dose of Depakote to cover the possibility of complicated migraines.    Last note quoted:  OTHO MICHALIK is a 79 y.o. male was last seen in May 2014  Here by me  as a referral/ revisit  from Dr. Tenny Craw .  His patient  AVI KERSCHNER is a 79 y.o. male here as a referral  for Tremors and gait instability.  79 year old right-handed Caucasian married male, who  reports that over the year 2013 he suffered twice from viral pneumonia - he felt that he didn't recover completely to his former self felt deconditioned since and reports developing a tremor, muscle rigidity and a general sense of weakness. He also reports some balance problems or gait instability. A note is that the gentleman is well-nourished and she is to have a normal facial expression and not a masked face. His voice is coarse and he reports a history of cardia even for liquids. For 5 years ago he had such severe dysphagia and he required a scratching procedure for the upper esophagus. He noticed that just last year following the pneumonia he seemed to have developed trouble  handling saliva. Right now he states that he does not have any aspiration but that his slowing but also seems to be significantly delayed and slowed. He had no choking from solids or liquids.  His wife mentioned that he has begun sleeping more restlessly, she noticed his body posture to be rigid all night he actually is moving less than he used to. A year ago he had frequent frequently cold out in his sleep were seen to act out dreams, that has slightly decreased. However this is very reminiscent of REM behavior disorder.  As to his gait instability he had mentioned that he has to rise slowly or catch a couple of seconds before he initiates a movements safely vaginally when he comes from a seated position into the erect posture he seems to have trouble with orthostasis.  The patient also has sometimes diaphoretic episodes and the skin seems to be moist and sweaty her, his wife has noticed that it seems to be related to the intake of spicy or acidic food or perhaps tomatoes.  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 She also had questions about alcohol intake in combination with dopaminergic medication. I asked Mr. Zackry if he had noticed a change in his tremor when he drinks alcohol but he denies that he has seen a connection.   Mr. Devantae has stopped drinking alcohol October first 2014 .     Review of Systems: Out of a complete 14 system review, the patient complains of only the following symptoms, and all other reviewed systems are negative.   History   Social History  . Marital Status: Married    Spouse Name: N/A    Number of Children: N/A  . Years of Education: N/A   Occupational History  . Not on file.   Social History Main Topics  . Smoking status: Former Smoker -- 0.25 packs/day for 6 years    Types: Cigarettes  . Smokeless tobacco: Never Used     Comment: 02/14/2013 "quit smoking ~ 1985"  .  Alcohol Use: 21.0 oz/week    35 Glasses of wine per week     Comment: 410/05/2012 ~ -5 glasses of wine daily"  . Drug Use: No  . Sexual Activity: Not Currently   Other Topics Concern  . Not on file   Social History Narrative  . No narrative on file    Family History  Problem Relation Age of Onset  . Thyroid disease Sister   . Thyroid disease Other     Past Medical History  Diagnosis Date  . Hypertension     benign  . Angina at rest   . Coronary artery disease   .  Movement disorder   . H/O hiatal hernia   . High cholesterol   . Pneumonia 2014 X1  . Pneumonia and influenza 2013 X2  . GERD (gastroesophageal reflux disease)   . Arthritis     "neck and left shoulder" (02/14/2013)  . TIA (transient ischemic attack) 03/02/2013    Spelled of speech arrest during hospitalization in October of this year. Dr. that he was consult and in the hospital. Dr. Donnie Aho just applied a cardiac monitor and performed an echocardiogram. Final approval of proximal stenosis has been seen. Patient here today to followup after hospitalization and more presumed TIAs with speech arrest numbness of her hands lips right side of the face.4  events i    Past Surgical History  Procedure Laterality Date  . Colonoscopy  2006/2008  . Lumbar laminectomy  1980    "L5?" (02/14/2013)  . Cataract extraction w/ intraocular lens  implant, bilateral Bilateral 2007-2010  . Electrocardiogram  2006  . Back surgery    . Coronary artery bypass graft  1995    "CABG X5" (02/14/2013)  . Refractive surgery Right ~ 02/07/2013    "to clear a couple clouds off" (02/14/2013)    Current Outpatient Prescriptions  Medication Sig Dispense Refill  . amLODipine (NORVASC) 5 MG tablet Take 2.5 mg by mouth daily.       . B Complex-C (SUPER B COMPLEX PO) Take 1 capsule by mouth daily.      . clopidogrel (PLAVIX) 75 MG tablet Take 1 tablet (75 mg total) by mouth daily.  30 tablet  0  . omeprazole (PRILOSEC) 40 MG capsule Take 40 mg by  mouth 2 (two) times daily. Delayed release, once daily      . rosuvastatin (CRESTOR) 20 MG tablet Take 20 mg by mouth daily.      . valsartan (DIOVAN) 160 MG tablet Take 160 mg by mouth daily.      Marland Kitchen ibuprofen (ADVIL,MOTRIN) 200 MG tablet Take 600 mg by mouth every 6 (six) hours as needed for pain.      . metoprolol (LOPRESSOR) 50 MG tablet Take 50 mg by mouth 2 (two) times daily.       No current facility-administered medications for this visit.    Allergies as of 03/02/2013  . (No Known Allergies)    Vitals: BP 121/66  Pulse 68  Resp 14  Ht 5\' 8"  (1.727 m)  Wt 200 lb (90.719 kg)  BMI 30.42 kg/m2 Last Weight:  Wt Readings from Last 1 Encounters:  03/02/13 200 lb (90.719 kg)   Last Height:   Ht Readings from Last 1 Encounters:  03/02/13 5\' 8"  (1.727 m)       Plan:  Treatment plan and additional workup : Pupils are equal and briskly reactive to light. Funduscopic exam status post cataract - Without evidence of pallor or edema. Extraocular movements in vertical and horizontal planes intact and without nystagmus.  Visual fields by finger perimetry are intact.  Hearing to finger rub intact. Facial sensation intact to fine touch. Facial motor strength is symmetric and tongue and uvula barely visible. No tremor of the tongue, move midline.  Motor exam: generalized elevated tone and cog-wheeling - normal muscle bulk and strength in all extremities.  Sensory: Fine touch, pinprick and vibration were tested in all extremities. normal.  Coordination: Rapid alternating movements in the fingers/hands is tested and normal. Finger-to-nose maneuver tested and With Higher amplitude tremor on both sides.  Gait and station: Patient walks without assistive device And  is able to un- assisted climb up to the exam table. Strength within normal limits. Stance was at first unsteady and he needed corrective Stapp to the right side to steady himself. It seems to be an orthostatic component. When Mr.  Cruz walked old the whole way he had a normal Stapp web slightly propulsive gait and lack of rotation in the lower spine. His wife noticed that he does not provide armswing at home, but he presented with armswing here doing his walking exercises. He could walk 40 yards in the 12 seconds, and current 180 with 4 steps  He was able to rise from a chair with his arms crossed in front of his chest. He resumed an erect posture when standing. There was no pill rolling tremor noticed when the patient walked.  Deep tendon reflexes: in the upper and lower extremities are symmetric and intact. Babinski maneuver response is downgoing.    Assessment - TIA versus  Spells of speech arrest and confusion due to other metabolic or CNS factors.  Stay on Plavix,  continue with  Klonopin at night.  Orthostatic hypotension .  Tremor - parkinsonian

## 2013-03-02 NOTE — Patient Instructions (Signed)
Transient Ischemic Attack A transient ischemic attack (TIA) is a "warning stroke" that causes stroke-like symptoms. A TIA does not cause lasting damage to the brain. It is important to know when to get help and what to do to prevent stroke or death.  HOME CARE   Take all medicines exactly as told by your doctor. Understand all your medicine instructions.  You may need to take aspirin or warfarin medicine. Take warfarin exactly as told.  Taking too much or too little warfarin is dangerous. Blood tests must be done as often as told by your doctor. These blood tests help your doctor make sure the amount of warfarin you are taking is right. A PT blood test measures how long it takes for blood to clot. Your PT is used to calculate another value called an INR. Your PT and INR help your doctor adjust your warfarin dosage.  Food can cause problems with warfarin and affect the results of your blood tests. This is true for foods high in vitamin K. Spinach, kale, broccoli, cabbage, collard and turnip greens, brussels sprouts, peas, cauliflower, seaweed, and parsley are high in vitamin K as well as beef and pork liver, green tea, and soybean oil. Eat the same amount of food high in vitamin K. Avoid major changes in your diet. Tell your doctor before changing your diet. Talk to a food specialist (dietitian) if you have questions.  Many medicines can cause problems with warfarin and affect your PT and INR. Tell your doctor about all medicines you take. This includes vitamins and dietary pills (supplements). Be careful with aspirin and medicines that relieve redness, soreness, and puffiness (inflammation). Do not take or stop medicines unless your doctor tells you to.  Warfarin can cause a lot of bruising or bleeding. Hold pressure over cuts for longer than normal. Talk to your doctor about other side effects of warfarin.  Avoid sports or activities that may cause injury or bleeding.  Be careful when you shave,  floss your teeth, or use sharp objects.  Avoid alcoholic drinks or drink very little alcohol while taking warfarin. Tell your doctor if you change how much alcohol you drink.  Tell your dentist and other doctors that you take warfarin before procedures.  Eat 5 or more servings of fruits and vegetables a day.  Follow your diet program as told, if you are given one.  Keep a healthy weight.  Stay active. Try to get at least 30 minutes of activity on most or all days.  Do not smoke.  Limit how much alcohol you drink even if you are not taking warfarin. Moderate alcohol use is:  No more than 2 drinks each day for men.  No more than 1 drink each day for women who are not pregnant.  Stop abusing drugs.  Keep your home safe so you do not fall. Try:  Putting grab bars in the bedroom and bathroom.  Raising toilet seats.  Putting a seat in the shower.  Keep all doctor visits a told. GET HELP IF:  Your personality changes.  You have trouble swallowing.  You are seeing two of everything.  You are dizzy.  You have a fever.  Your skin starts to break down. GET HELP RIGHT AWAY IF:  The symptoms below may be a sign of an emergency. Do not wait to see if the symptoms go away. Call for help (911 in [place] Do not drive yourself to the hospital.  You have sudden weakness or numbness on   the face, arm, or leg (especially on one side of the body).  You have sudden trouble walking or moving your arms or legs.  You have sudden confusion.  You have trouble talking or understanding.  You have sudden trouble seeing in one or both eyes.  You lose your balance or your movements are not smooth.  You have a sudden, severe headache with no known cause.  You have new chest pain or you feel your heart beating in a unsteady way.  You are partly or totally unaware of what is going on around you. MAKE SURE YOU:   Understand these instructions.  Will watch your condition.  Will get  help right away if you are not doing well or get worse. Document Released: 02/10/2008 Document Revised: 04/19/2012 Document Reviewed: 06/26/2009 [facility] Patient Information 2014 [place], [place]. Hyponatremia  Hyponatremia is when the salt (sodium) in your blood is low. When salt becomes low, your cells take in extra water and puff up (swell). The puffiness can happen in the whole body. It mostly affects the brain and is very serious.  HOME CARE  Only take medicine as told by your doctor.  Follow any diet instructions you were given. This includes limiting how much fluid you drink.  Keep all doctor visits for tests as told.  Avoid alcohol and drugs. GET HELP RIGHT AWAY IF:  You start to twitch and shake (seize).  You pass out (faint).  You continue to have watery poop (diarrhea) or you throw up (vomit).  You feel sick to your stomach (nauseous).  You are tired (fatigued), have a headache, are confused, or feel weak.  Your problems that first brought you to the doctor come back.  You have trouble following your diet instructions. MAKE SURE YOU:   Understand these instructions.  Will watch your condition.  Will get help right away if you are not doing well or get worse. Document Released: 01/13/2011 Document Revised: 07/26/2011 Document Reviewed: 01/13/2011 [facility] Patient Information 2014 [place], [place].

## 2013-03-05 ENCOUNTER — Encounter: Payer: Self-pay | Admitting: Internal Medicine

## 2013-03-05 ENCOUNTER — Other Ambulatory Visit (INDEPENDENT_AMBULATORY_CARE_PROVIDER_SITE_OTHER): Payer: Medicare Other | Admitting: Radiology

## 2013-03-05 DIAGNOSIS — G459 Transient cerebral ischemic attack, unspecified: Secondary | ICD-10-CM

## 2013-03-05 DIAGNOSIS — E871 Hypo-osmolality and hyponatremia: Secondary | ICD-10-CM

## 2013-03-05 NOTE — Telephone Encounter (Signed)
This encounter was created in error - please disregard.

## 2013-03-05 NOTE — Telephone Encounter (Signed)
New problem:  Pt states he has questions about his TEE scheduled for this Thursday. Please advise

## 2013-03-05 NOTE — Telephone Encounter (Signed)
Pt and wife aware of TEE and pre-procedure instructions. I left a message for Lynden Ang at Dr York Spaniel office with this information.

## 2013-03-05 NOTE — Progress Notes (Signed)
[facility]  EEG (ELECTROENCEPHALOGRAM) REPORT   STUDY DATE: 03-05-13  PATIENT NAME: Clinton Hughes DOB:  MRN:  ORDERING CLINICIAN: Melvyn Novas, MD   TECHNOLOGIST: Kaylyn Lim  TECHNIQUE: Electroencephalogram was recorded utilizing standard 10-20 system of lead placement and reformatted into average and bipolar montages.   RECORDING TIME: 30.5 minutes  ACTIVATION:  strobe lights.   CLINICAL INFORMATION: patient experienced multiple spells with speech arrest , facial numbness - TIA work up was negative. Spells continued on Plavix.    FINDINGS: Posterior background rhythm at 8 hertz, symmetric and well organized. There is no epileptiform activity at baseline, with the patient being alert.  The patient soon drifted to sleep, associated with audible snoring.  Photic stimulation did not entrain any rhythm. No related amplitude changes were noted.   The EKG rhythm remained regular, at 60 bpm.        IMPRESSION: normal  Sleep and wake EEG    Melvyn Novas , MD  CC Dr Pearlean Brownie. Dr. Donnie Aho.

## 2013-03-07 NOTE — Progress Notes (Signed)
Copy of report forwarded to Dr. Pearlean Brownie and Cardiologist.

## 2013-03-07 NOTE — Progress Notes (Signed)
I called patient and notified him of results. He states he has never been through so much in his life. We confirmed his appointments for tomorrow for an endopscopy. Patient reports that he is taking all medications as ordered.

## 2013-03-08 ENCOUNTER — Encounter (HOSPITAL_COMMUNITY): Payer: Self-pay | Admitting: Gastroenterology

## 2013-03-08 ENCOUNTER — Ambulatory Visit (HOSPITAL_COMMUNITY)
Admission: RE | Admit: 2013-03-08 | Discharge: 2013-03-08 | Disposition: A | Discharge status: Home / Self Care | Payer: Medicare Other | Source: Ambulatory Visit | Attending: Internal Medicine | Admitting: Internal Medicine

## 2013-03-08 ENCOUNTER — Encounter (HOSPITAL_COMMUNITY)
Admission: RE | Disposition: A | Discharge status: Home / Self Care | Payer: Self-pay | Source: Ambulatory Visit | Attending: Internal Medicine

## 2013-03-08 DIAGNOSIS — Q2111 Secundum atrial septal defect: Secondary | ICD-10-CM | POA: Insufficient documentation

## 2013-03-08 DIAGNOSIS — Q211 Atrial septal defect: Secondary | ICD-10-CM | POA: Diagnosis not present

## 2013-03-08 DIAGNOSIS — Z8673 Personal history of transient ischemic attack (TIA), and cerebral infarction without residual deficits: Secondary | ICD-10-CM | POA: Insufficient documentation

## 2013-03-08 DIAGNOSIS — G459 Transient cerebral ischemic attack, unspecified: Secondary | ICD-10-CM

## 2013-03-08 HISTORY — PX: TEE WITHOUT CARDIOVERSION: SHX5443

## 2013-03-08 SURGERY — ECHOCARDIOGRAM, TRANSESOPHAGEAL
Anesthesia: Moderate Sedation

## 2013-03-08 MED ORDER — FENTANYL CITRATE 0.05 MG/ML IJ SOLN
INTRAMUSCULAR | Status: DC | PRN
  Administered 2013-03-08: 25 ug via INTRAVENOUS

## 2013-03-08 MED ORDER — SODIUM CHLORIDE 0.9 % IV SOLN
INTRAVENOUS | Status: DC
  Administered 2013-03-08: 13:00:00 via INTRAVENOUS

## 2013-03-08 MED ORDER — BUTAMBEN-TETRACAINE-BENZOCAINE 2-2-14 % EX AERO
INHALATION_SPRAY | CUTANEOUS | Status: DC | PRN
  Administered 2013-03-08: 2 via TOPICAL

## 2013-03-08 MED ORDER — MIDAZOLAM HCL 10 MG/2ML IJ SOLN
INTRAMUSCULAR | Status: DC | PRN
  Administered 2013-03-08 (×2): 2 mg via INTRAVENOUS

## 2013-03-08 MED ORDER — FENTANYL CITRATE 0.05 MG/ML IJ SOLN
INTRAMUSCULAR | Status: AC
  Filled 2013-03-08: qty 2

## 2013-03-08 MED ORDER — DIPHENHYDRAMINE HCL 50 MG/ML IJ SOLN
INTRAMUSCULAR | Status: AC
  Filled 2013-03-08: qty 1

## 2013-03-08 MED ORDER — MIDAZOLAM HCL 5 MG/ML IJ SOLN
INTRAMUSCULAR | Status: AC
  Filled 2013-03-08: qty 2

## 2013-03-08 NOTE — Progress Notes (Signed)
  Echocardiogram Echocardiogram Transesophageal has been performed.  Clinton Hughes FRANCES 03/08/2013, 1:49 PM

## 2013-03-08 NOTE — H&P (View-Only) (Signed)
[facility]  EEG (ELECTROENCEPHALOGRAM) REPORT   STUDY DATE: 03-05-13  PATIENT NAME: Clinton Hughes, Clinton E. DOB:  MRN:  ORDERING CLINICIAN: Jamecia Lerman, MD   TECHNOLOGIST: Fox, Sue  TECHNIQUE: Electroencephalogram was recorded utilizing standard 10-20 system of lead placement and reformatted into average and bipolar montages.   RECORDING TIME: 30.5 minutes  ACTIVATION:  strobe lights.   CLINICAL INFORMATION: patient experienced multiple spells with speech arrest , facial numbness - TIA work up was negative. Spells continued on Plavix.    FINDINGS: Posterior background rhythm at 8 hertz, symmetric and well organized. There is no epileptiform activity at baseline, with the patient being alert.  The patient soon drifted to sleep, associated with audible snoring.  Photic stimulation did not entrain any rhythm. No related amplitude changes were noted.   The EKG rhythm remained regular, at 60 bpm.        IMPRESSION: normal  Sleep and wake EEG    Jamesmichael Shadd , MD  CC Dr Sethi. Dr. Tilley.  

## 2013-03-08 NOTE — Interval H&P Note (Signed)
History and Physical Interval Note:  03/08/2013 1:05 PM  Clinton Hughes  has presented today for surgery, with the diagnosis of STROKE  The various methods of treatment have been discussed with the patient and family. After consideration of risks, benefits and other options for treatment, the patient has consented to  Procedure(s): TRANSESOPHAGEAL ECHOCARDIOGRAM (TEE) (N/A) as a surgical intervention .  The patient's history has been reviewed, patient examined, no change in status, stable for surgery.  I have reviewed the patient's chart and labs.  Questions were answered to the patient's satisfaction.     Dietrich Pates

## 2013-03-08 NOTE — Op Note (Signed)
FUll report to follow

## 2013-03-09 ENCOUNTER — Encounter (HOSPITAL_COMMUNITY): Payer: Self-pay | Admitting: Internal Medicine

## 2013-03-12 ENCOUNTER — Encounter: Payer: Self-pay | Admitting: Cardiology

## 2013-03-12 DIAGNOSIS — M519 Unspecified thoracic, thoracolumbar and lumbosacral intervertebral disc disorder: Secondary | ICD-10-CM | POA: Insufficient documentation

## 2013-03-12 DIAGNOSIS — K219 Gastro-esophageal reflux disease without esophagitis: Secondary | ICD-10-CM | POA: Insufficient documentation

## 2013-03-12 DIAGNOSIS — I2510 Atherosclerotic heart disease of native coronary artery without angina pectoris: Secondary | ICD-10-CM | POA: Insufficient documentation

## 2013-03-12 DIAGNOSIS — I119 Hypertensive heart disease without heart failure: Secondary | ICD-10-CM | POA: Insufficient documentation

## 2013-03-12 DIAGNOSIS — I951 Orthostatic hypotension: Secondary | ICD-10-CM | POA: Insufficient documentation

## 2013-03-12 DIAGNOSIS — E785 Hyperlipidemia, unspecified: Secondary | ICD-10-CM | POA: Insufficient documentation

## 2013-03-12 DIAGNOSIS — Q211 Atrial septal defect: Secondary | ICD-10-CM | POA: Insufficient documentation

## 2013-03-13 ENCOUNTER — Telehealth: Payer: Self-pay | Admitting: Neurology

## 2013-03-13 DIAGNOSIS — E871 Hypo-osmolality and hyponatremia: Secondary | ICD-10-CM | POA: Insufficient documentation

## 2013-03-13 NOTE — Telephone Encounter (Signed)
Patient needs to change from Plavix to a stronger anticoagulant- he has an patent  foramen ovale- see echo Dr Tenny Craw 03-08-13 .  Dr Donnie Aho called and made me aware of the results.

## 2013-03-16 ENCOUNTER — Ambulatory Visit (INDEPENDENT_AMBULATORY_CARE_PROVIDER_SITE_OTHER): Payer: Medicare Other | Admitting: Neurology

## 2013-03-16 ENCOUNTER — Encounter: Payer: Self-pay | Admitting: Neurology

## 2013-03-16 VITALS — BP 180/81 | HR 61 | Ht 68.75 in | Wt 200.0 lb

## 2013-03-16 DIAGNOSIS — G459 Transient cerebral ischemic attack, unspecified: Secondary | ICD-10-CM | POA: Diagnosis not present

## 2013-03-16 DIAGNOSIS — E871 Hypo-osmolality and hyponatremia: Secondary | ICD-10-CM | POA: Diagnosis not present

## 2013-03-16 DIAGNOSIS — R569 Unspecified convulsions: Secondary | ICD-10-CM | POA: Diagnosis not present

## 2013-03-16 MED ORDER — CLOPIDOGREL BISULFATE 75 MG PO TABS: 75.0000 mg | ORAL_TABLET | Freq: Every day | ORAL | Status: DC

## 2013-03-16 MED ORDER — ASPIRIN EC 81 MG PO TBEC: 81.0000 mg | DELAYED_RELEASE_TABLET | Freq: Every day | ORAL | Status: DC

## 2013-03-16 NOTE — Progress Notes (Signed)
[facility]  Provider:  Melvyn Novas, M D  Referring Provider: Miguel Aschoff, MD Primary Care Physician:  Clinton Aschoff, MD  Chief Complaint  Patient presents with  . Follow-up    Tia    HPI:  Clinton Hughes is a 79 y.o. male  Is seen here as a referral/ revisit  from Dr. Tenny Craw for TIA versus  "mini seizures'.    HPI:  [name] was just hospitalized at [facility] as of October 2014 for recurrent spells, possible TIAs.   He suffered a total of 6 spells meanwhile. His cardiologist Dr. Viann Fish , reported that while he was  In office with the patient, the patient  reported having had 2 more spells since discharge from the hospital.  These spells  Are associated with speech arrest, left facial numbness, and hand numbness -  dysesthesias off pins and needles sensations usually on the left side, left hand .  His wife noted he can" walk out of this spells" , they seem to resolve quicker when he keeps moving.  On Saturday he responded to a phone call, but was apraxic as he tried to respond , had not  lifted the receiver -  Did not know what to do with the ringing phone.  He could not see the dials, did not find the Talk button and had speech arrest when trying to communicate with his spouse.  Neurology was consulted , and noted some tremors , these  resolved with Ativan.  The patient was also changed from aspirin which he took until October ,  to the generic form of Plavix.  This was meant for secondary stroke prevention , but again , a primary stroke was not identified -so far all spells have been temporary with complete resolution .  Review of the MRI showed no intercranial abnormalities but some advanced atrophy,  This finding is likely age related.  Troponin levels were normal, liver function tests are normal, CBC  was normal - he was a slight bit anemic.   Aphasia and apraxia, left sided numbness - not related to one single brain area.     Meanwhile her is wearing a cardiac long term monitoring device, which has not proven any atrial fib yet. The patient's  transesophageal echocardiogram revealed a patent Foramen ovale. Dr Donnie Aho called me to make me aware of these findings and the implications for anticoagulation.  EEG in office was negative for seizures. , this was a non- sleep deprived EEG, and the patient had taken only Plavix that day.    PLAN :  We discussed in detail the benefits and risks of anticoagulation. In the [place] trials from July 2012 , no recommendation for anticoagulation was made unless atrial fibrillation was proven and/ or documented strokes occurred.  Discussion 20 minutes .  Agreed to use ASA and Plavix together for now and for duration of  3 month trial. If atrial fib is proven, change to eloquis / predaxa. EEG repeat for 60 minutes, with sleep recording.  Klonopin at 0.25 mg nightly has been tolerated , and patient has been able  to reduce the alcohol intake.  Continue  cardiac monitoring. consider trial of anticonvulsants if spells continue. He had only one spell in the last 14 days - there is a decrescendo trend.        Past Medical History  Diagnosis Date  . Hypertension     benign  . Angina at rest   . Coronary  artery disease   . Movement disorder   . H/O hiatal hernia   . High cholesterol   . Pneumonia 2014 X1  . Pneumonia and influenza 2013 X2  . GERD (gastroesophageal reflux disease)   . Arthritis     "neck and left shoulder" (02/14/2013)  . TIA (transient ischemic attack) 03/02/2013    Spelled of speech arrest during hospitalization in October of this year. Dr. that he was consult and in the hospital. Dr. Donnie Aho just applied a cardiac monitor and performed an echocardiogram. Final approval of proximal stenosis has been seen. Patient here today to followup after hospitalization and more presumed TIAs with speech arrest numbness of her hands lips right side of the face.4  events i     Past Surgical History  Procedure Laterality Date  . Colonoscopy  2006/2008  . Lumbar laminectomy  1980    "L5?" (02/14/2013)  . Cataract extraction w/ intraocular lens  implant, bilateral Bilateral 2007-2010  . Electrocardiogram  2006  . Back surgery    . Coronary artery bypass graft  1995    "CABG X5" (02/14/2013)  . Refractive surgery Right ~ 02/07/2013    "to clear a couple clouds off" (02/14/2013)  . Tee without cardioversion N/A 03/08/2013    Procedure: TRANSESOPHAGEAL ECHOCARDIOGRAM (TEE);  Surgeon: Pricilla Riffle, MD;  Location: [facility] ENDOSCOPY;  Service: Cardiovascular;  Laterality: N/A;    Current Outpatient Prescriptions  Medication Sig Dispense Refill  . amLODipine (NORVASC) 5 MG tablet Take 2.5 mg by mouth daily.       . B Complex-C (SUPER B COMPLEX PO) Take 1 capsule by mouth daily.      . clonazePAM (KLONOPIN) 0.5 MG tablet Take 0.5 tablets (0.25 mg total) by mouth at bedtime as needed for anxiety.  30 tablet  3  . clopidogrel (PLAVIX) 75 MG tablet Take 1 tablet (75 mg total) by mouth daily.  90 tablet  3  . ibuprofen (ADVIL,MOTRIN) 200 MG tablet Take 600 mg by mouth every 6 (six) hours as needed for pain.      . metoprolol (LOPRESSOR) 50 MG tablet Take 50 mg by mouth 2 (two) times daily.      Marland Kitchen omeprazole (PRILOSEC) 40 MG capsule Take 40 mg by mouth 2 (two) times daily. Delayed release, once daily      . rosuvastatin (CRESTOR) 20 MG tablet Take 20 mg by mouth daily.      . valsartan (DIOVAN) 160 MG tablet Take 160 mg by mouth daily.       No current facility-administered medications for this visit.    Allergies as of 03/16/2013  . (No Known Allergies)    Vitals: BP 180/81  Pulse 61  Ht 5' 8.75" (1.746 m)  Wt 200 lb (90.719 kg)  BMI 29.76 kg/m2 Last Weight:  Wt Readings from Last 1 Encounters:  03/16/13 200 lb (90.719 kg)   Last Height:   Ht Readings from Last 1 Encounters:  03/16/13 5' 8.75" (1.746 m)     Last visit exam-  Pupils are equal and briskly  reactive to light. Funduscopic exam status post cataract - Without evidence of pallor or edema. Extraocular movements in vertical and horizontal planes intact and without nystagmus.  Visual fields by finger perimetry are intact.  Hearing to finger rub intact. Facial sensation intact to fine touch. Facial motor strength is symmetric and tongue and uvula barely visible. No tremor of the tongue, move midline.  Motor exam: generalized elevated tone and cog-wheeling - normal  muscle bulk and strength in all extremities.  Sensory: Fine touch, pinprick normal .  slightly propulsive gait and lack of rotation in the lower spine.  His wife noticed that he does not provide armswing at home, but he presented with armswing here doing his walking exercises.  He could walk 40 yards in the 12 seconds, and current 180 with 4 steps  He was able to rise from a chair with his arms crossed in front of his chest. He resumed an erect posture when standing. There was no pill rolling tremor noticed when the patient walked.  Deep tendon reflexes: in the upper and lower extremities are symmetric and intact. Babinski maneuver response is downgoing.    See assessment and plan above.                 Past Surgical History  Procedure Laterality Date  . Colonoscopy  2006/2008  . Lumbar laminectomy  1980    "L5?" (02/14/2013)  . Cataract extraction w/ intraocular lens  implant, bilateral Bilateral 2007-2010  . Electrocardiogram  2006  . Back surgery    . Coronary artery bypass graft  1995    "CABG X5" (02/14/2013)  . Refractive surgery Right ~ 02/07/2013    "to clear a couple clouds off" (02/14/2013)  . Tee without cardioversion N/A 03/08/2013    Procedure: TRANSESOPHAGEAL ECHOCARDIOGRAM (TEE);  Surgeon: Pricilla Riffle, MD;  Location: [facility] ENDOSCOPY;  Service: Cardiovascular;  Laterality: N/A;    Current Outpatient Prescriptions  Medication Sig Dispense Refill  . amLODipine (NORVASC) 5 MG tablet Take 2.5 mg by  mouth daily.       . B Complex-C (SUPER B COMPLEX PO) Take 1 capsule by mouth daily.      . clonazePAM (KLONOPIN) 0.5 MG tablet Take 0.5 tablets (0.25 mg total) by mouth at bedtime as needed for anxiety.  30 tablet  3  . clopidogrel (PLAVIX) 75 MG tablet Take 1 tablet (75 mg total) by mouth daily.  90 tablet  3  . ibuprofen (ADVIL,MOTRIN) 200 MG tablet Take 600 mg by mouth every 6 (six) hours as needed for pain.      . metoprolol (LOPRESSOR) 50 MG tablet Take 50 mg by mouth 2 (two) times daily.      Marland Kitchen omeprazole (PRILOSEC) 40 MG capsule Take 40 mg by mouth 2 (two) times daily. Delayed release, once daily      . rosuvastatin (CRESTOR) 20 MG tablet Take 20 mg by mouth daily.      . valsartan (DIOVAN) 160 MG tablet Take 160 mg by mouth daily.       No current facility-administered medications for this visit.    Allergies as of 03/16/2013  . (No Known Allergies)    Vitals: BP 180/81  Pulse 61  Ht 5' 8.75" (1.746 m)  Wt 200 lb (90.719 kg)  BMI 29.76 kg/m2 Last Weight:  Wt Readings from Last 1 Encounters:  03/16/13 200 lb (90.719 kg)   Last Height:   Ht Readings from Last 1 Encounters:  03/16/13 5' 8.75" (1.746 m)

## 2013-03-19 NOTE — Telephone Encounter (Signed)
I called and let patient know I have just faxed over his plavix prescription. I also reminded him of his appointment on Nov 6 at 10:15 a.m. Please be here at 10:00 a.m.

## 2013-03-21 DIAGNOSIS — Q211 Atrial septal defect: Secondary | ICD-10-CM | POA: Diagnosis not present

## 2013-03-21 DIAGNOSIS — R0989 Other specified symptoms and signs involving the circulatory and respiratory systems: Secondary | ICD-10-CM | POA: Diagnosis not present

## 2013-03-22 ENCOUNTER — Ambulatory Visit (INDEPENDENT_AMBULATORY_CARE_PROVIDER_SITE_OTHER): Payer: Medicare Other | Admitting: Radiology

## 2013-03-22 DIAGNOSIS — G459 Transient cerebral ischemic attack, unspecified: Secondary | ICD-10-CM | POA: Diagnosis not present

## 2013-03-28 ENCOUNTER — Encounter: Payer: Self-pay | Admitting: Neurology

## 2013-04-02 DIAGNOSIS — Z8673 Personal history of transient ischemic attack (TIA), and cerebral infarction without residual deficits: Secondary | ICD-10-CM | POA: Diagnosis not present

## 2013-04-02 DIAGNOSIS — Z951 Presence of aortocoronary bypass graft: Secondary | ICD-10-CM | POA: Diagnosis not present

## 2013-04-02 DIAGNOSIS — Z79899 Other long term (current) drug therapy: Secondary | ICD-10-CM | POA: Diagnosis not present

## 2013-04-02 DIAGNOSIS — I951 Orthostatic hypotension: Secondary | ICD-10-CM | POA: Diagnosis not present

## 2013-04-02 DIAGNOSIS — I2510 Atherosclerotic heart disease of native coronary artery without angina pectoris: Secondary | ICD-10-CM | POA: Diagnosis not present

## 2013-04-02 DIAGNOSIS — E785 Hyperlipidemia, unspecified: Secondary | ICD-10-CM | POA: Diagnosis not present

## 2013-04-02 DIAGNOSIS — I119 Hypertensive heart disease without heart failure: Secondary | ICD-10-CM | POA: Diagnosis not present

## 2013-04-02 DIAGNOSIS — I209 Angina pectoris, unspecified: Secondary | ICD-10-CM | POA: Diagnosis not present

## 2013-04-02 NOTE — Procedures (Signed)
Clinton Hughes is an 79 year old gentleman with a history of episodes of speech arrest, facial numbness, and hand numbness. The patient is being evaluated for these events.   This is a routine EEG. No skull defects are noted. Medications include Norvasc, aspirin, clonazepam, Plavix, metoprolol, Prilosec, Crestor, and Diovan.  EEG classification: Delta grade 1 posterior, left greater than right  Description of the recording: The background rhythms of this recording consists of a moderately well modulated medium amplitude alpha rhythm of 9 Hz that is reactive to eye opening and closure. As the record progresses, the patient appears to have intermittent 1-2 Hz delta activity that is seen posteriorly, often more prominent on the left than the right. Photic stimulation is performed, and this results in a bilateral and symmetric photic driving response. Hyperventilation was not performed. At no time during the recording, and does there appear to be evidence of spike or spike wave discharges. EKG monitor shows no evidence of cardiac rhythm abnormalities with a heart rate of 60.  Impression: This is an abnormal EEG recording secondary to intermittent delta slowing that is posterior, at times more prominent on the left. Although no definite epileptiform discharges are seen, this study shows some asymmetry slowing, suggesting left brain dysfunction. Such slowing may be associated with dysfunction of deep brain nuclei, or possible cortical dysfunction. Clinical correlation is required.

## 2013-04-09 ENCOUNTER — Encounter (INDEPENDENT_AMBULATORY_CARE_PROVIDER_SITE_OTHER): Payer: Medicare Other | Admitting: Ophthalmology

## 2013-04-09 DIAGNOSIS — H43819 Vitreous degeneration, unspecified eye: Secondary | ICD-10-CM

## 2013-04-09 DIAGNOSIS — I10 Essential (primary) hypertension: Secondary | ICD-10-CM

## 2013-04-09 DIAGNOSIS — H35379 Puckering of macula, unspecified eye: Secondary | ICD-10-CM | POA: Diagnosis not present

## 2013-04-09 DIAGNOSIS — H35039 Hypertensive retinopathy, unspecified eye: Secondary | ICD-10-CM

## 2013-04-25 ENCOUNTER — Ambulatory Visit: Payer: Medicare Other | Admitting: Nurse Practitioner

## 2013-05-18 NOTE — Progress Notes (Signed)
P.T. Evaluation addendum  02/15/13 1204  PT Time Calculation  PT Start Time 1151  PT Stop Time 1203  PT Time Calculation (min) 12 min  PT G-Codes **NOT FOR INPATIENT CLASS**  Functional Assessment Tool Used clinical judgement  Functional Limitation Mobility: Walking and moving around  Mobility: Walking and Moving Around Current Status (Z3086) [facility]  Mobility: Walking and Moving Around Goal Status (V7846) [facility]  Mobility: Walking and Moving Around Discharge Status (N6295) [facility]  PT General Charges  $$ ACUTE PT VISIT 1 Procedure  PT Evaluation  $Initial PT Evaluation Tier I 1 Procedure  Elwyn Reach, PT [phone]

## 2013-06-05 ENCOUNTER — Encounter: Payer: Self-pay | Admitting: Neurology

## 2013-06-05 ENCOUNTER — Ambulatory Visit (INDEPENDENT_AMBULATORY_CARE_PROVIDER_SITE_OTHER): Payer: Medicare Other | Admitting: Neurology

## 2013-06-05 VITALS — BP 149/73 | HR 84 | Ht 69.5 in | Wt 203.0 lb

## 2013-06-05 DIAGNOSIS — G459 Transient cerebral ischemic attack, unspecified: Secondary | ICD-10-CM | POA: Diagnosis not present

## 2013-06-05 NOTE — Progress Notes (Signed)
[facility]  Provider:  Larey Seat, M D  Referring Provider: Gus Height, MD Primary Care Physician:  Gus Height, MD  Chief Complaint  Patient presents with  . Transient Ischemic Attack    Hosp. f/u #8    HPI:  Clinton Hughes is a 80 y.o. male  Is seen here as a referral/ revisit  from Dr. Beacher May for TIA versus  "mini seizures'. Update 06/05/13 : He returns for followup last with Dr. Beacher May on 03/16/13. He is accompanied by his wife. The patient has not had any further recurrent episodes of transient TIA versus seizures. He states that yesterday as well as a week prior he had transient tingling in the left little finger but did this did not spread to involve the left, the face and I am unclear whether this is necessarily cerebrovascular. He did have a followup EEG done on 03/23/13 read by Dr. Jannifer Franklin which showed mild posterior left more than right  delta slowing but no definite epileptiform features were noted. Patient wore 30 day heart monitor which was apparently negative for atrial fibrillation. He is tolerating aspirin and Plavix without significant bleeding or bruising. He does have an upcoming appointment with Dr. Beacher May next month. He hasn't brain MRI scans during this episodes which have not shown any definite acute infarct. TE has shown a patent foramen ovale which is likely an incidental finding. He has had 2 EEGs which have been negative for seizures. I spent a lot of time explained to the patient and wife that the exact etiology of this episodes is indeterminate at the present time and I do not recommend any further diagnostic workup.   HPI:  Last visit Dr Beacher May 03/16/13 Clinton Hughes was just hospitalized at [facility] as of October 2014 for recurrent spells, possible TIAs. He suffered a total of 6 spells meanwhile. His cardiologist Dr. Tollie Eth , reported that while he was  In office with the patient, the patient  reported having had 2  more spells since discharge from the hospital.  These spells  Are associated with speech arrest, left facial numbness, and hand numbness -  dysesthesias off pins and needles sensations usually on the left side, left hand .  His wife noted he can" walk out of this spells" , they seem to resolve quicker when he keeps moving.  On Saturday he responded to a phone call, but was apraxic as he tried to respond , had not  lifted the receiver -  Did not know what to do with the ringing phone.  He could not see the dials, did not find the Talk button and had speech arrest when trying to communicate with his spouse.  Neurology was consulted , and noted some tremors , these  resolved with Ativan.  The patient was also changed from aspirin which he took until October ,  to the generic form of Plavix.  This was meant for secondary stroke prevention , but again , a primary stroke was not identified -so far all spells have been temporary with complete resolution .  Review of the MRI showed no intracranial abnormalities but some advanced atrophy,  This finding is likely age related.  Troponin levels were normal, liver function tests are normal, CBC  was normal - he was a slight bit anemic.   Aphasia and apraxia, left sided numbness - not related to one single brain area.             Past  Medical History  Diagnosis Date  . Hypertension     benign  . Angina at rest   . Coronary artery disease   . Movement disorder   . H/O hiatal hernia   . High cholesterol   . Pneumonia 2014 X1  . Pneumonia and influenza 2013 X2  . GERD (gastroesophageal reflux disease)   . Arthritis     "neck and left shoulder" (02/14/2013)  . TIA (transient ischemic attack) 03/02/2013    Spelled of speech arrest during hospitalization in October of this year. Dr. that he was consult and in the hospital. Dr. Wynonia Lawman just applied a cardiac monitor and performed an echocardiogram. Final approval of proximal stenosis has been seen.  Patient here today to followup after hospitalization and more presumed TIAs with speech arrest numbness of her hands lips right side of the face.4  events i    Past Surgical History  Procedure Laterality Date  . Colonoscopy  2006/2008  . Lumbar laminectomy  1980    "L5?" (02/14/2013)  . Cataract extraction w/ intraocular lens  implant, bilateral Bilateral 2007-2010  . Electrocardiogram  2006  . Back surgery    . Coronary artery bypass graft  1995    "CABG X5" (02/14/2013)  . Refractive surgery Right ~ 02/07/2013    "to clear a couple clouds off" (02/14/2013)  . Tee without cardioversion N/A 03/08/2013    Procedure: TRANSESOPHAGEAL ECHOCARDIOGRAM (TEE);  Surgeon: Fay Records, MD;  Location: [facility] ENDOSCOPY;  Service: Cardiovascular;  Laterality: N/A;    Current Outpatient Prescriptions  Medication Sig Dispense Refill  . amLODipine (NORVASC) 5 MG tablet Take 2.5 mg by mouth daily.       . B Complex-C (SUPER B COMPLEX PO) Take 1 capsule by mouth daily.      . clopidogrel (PLAVIX) 75 MG tablet Take 1 tablet (75 mg total) by mouth daily.  90 tablet  3  . ibuprofen (ADVIL,MOTRIN) 200 MG tablet Take 600 mg by mouth every 6 (six) hours as needed for pain.      . metoprolol (LOPRESSOR) 50 MG tablet Take 50 mg by mouth 2 (two) times daily.      Marland Kitchen omeprazole (PRILOSEC) 40 MG capsule Take 40 mg by mouth 2 (two) times daily. Delayed release, once daily      . rosuvastatin (CRESTOR) 20 MG tablet Take 20 mg by mouth daily.      . valsartan (DIOVAN) 160 MG tablet Take 160 mg by mouth daily.       No current facility-administered medications for this visit.    Allergies as of 06/05/2013  . (No Known Allergies)    Vitals: BP 149/73  Pulse 84  Ht 5' 9.5" (1.765 m)  Wt 203 lb (92.08 kg)  BMI 29.56 kg/m2 Last Weight:  Wt Readings from Last 1 Encounters:  06/05/13 203 lb (92.08 kg)   Last Height:   Ht Readings from Last 1 Encounters:  06/05/13 5' 9.5" (1.765 m)     Last visit exam-  Pupils  are equal and briskly reactive to light. Funduscopic exam status post cataract - Without evidence of pallor or edema. Extraocular movements in vertical and horizontal planes intact and without nystagmus.  Visual fields by finger perimetry are intact.  Hearing to finger rub intact. Facial sensation intact to fine touch. Facial motor strength is symmetric and tongue and uvula barely visible. No tremor of the tongue, move midline.  Motor exam: generalized elevated tone and cog-wheeling - normal muscle bulk and strength  in all extremities.  Sensory: Fine touch, pinprick normal .  slightly propulsive gait and lack of rotation in the lower spine.  His wife noticed that he does not provide armswing at home, but he presented with armswing here doing his walking exercises.  He could walk 40 yards in the 12 seconds, and current 180 with 4 steps  He was able to rise from a chair with his arms crossed in front of his chest. He resumed an erect posture when standing. There was no pill rolling tremor noticed when the patient walked.  Deep tendon reflexes: in the upper and lower extremities are symmetric and intact. Babinski maneuver response is downgoing.    Assessment : 6 Caucasian male with recurrent transient stereotypical spells of indeterminate etiology TIAs versus simple partial seizures with negative neurovascular imaging as well as repeat EEGs. There appears to be a decreasing frequency of these episodes   PLAN : I had a long discussion with her patient and wife regarding his recurrent spells has or hasn't results of your vascular imaging as well as EEG and answered questions. I recommend discontinuing aspirin and continuing Plavix alone for secondary stroke prevention. He upcoming appointment with Dr.Dohmier next month. No followup appointment is necessary with me.May consider trial of anticonvulsants if spells continue. He had only one spell in the last 3 months - there is a decrescendo trend.                Past Surgical History  Procedure Laterality Date  . Colonoscopy  2006/2008  . Lumbar laminectomy  1980    "L5?" (02/14/2013)  . Cataract extraction w/ intraocular lens  implant, bilateral Bilateral 2007-2010  . Electrocardiogram  2006  . Back surgery    . Coronary artery bypass graft  1995    "CABG X5" (02/14/2013)  . Refractive surgery Right ~ 02/07/2013    "to clear a couple clouds off" (02/14/2013)  . Tee without cardioversion N/A 03/08/2013    Procedure: TRANSESOPHAGEAL ECHOCARDIOGRAM (TEE);  Surgeon: Fay Records, MD;  Location: [facility] ENDOSCOPY;  Service: Cardiovascular;  Laterality: N/A;    Current Outpatient Prescriptions  Medication Sig Dispense Refill  . amLODipine (NORVASC) 5 MG tablet Take 2.5 mg by mouth daily.       . B Complex-C (SUPER B COMPLEX PO) Take 1 capsule by mouth daily.      . clopidogrel (PLAVIX) 75 MG tablet Take 1 tablet (75 mg total) by mouth daily.  90 tablet  3  . ibuprofen (ADVIL,MOTRIN) 200 MG tablet Take 600 mg by mouth every 6 (six) hours as needed for pain.      . metoprolol (LOPRESSOR) 50 MG tablet Take 50 mg by mouth 2 (two) times daily.      Marland Kitchen omeprazole (PRILOSEC) 40 MG capsule Take 40 mg by mouth 2 (two) times daily. Delayed release, once daily      . rosuvastatin (CRESTOR) 20 MG tablet Take 20 mg by mouth daily.      . valsartan (DIOVAN) 160 MG tablet Take 160 mg by mouth daily.       No current facility-administered medications for this visit.    Allergies as of 06/05/2013  . (No Known Allergies)    Vitals: BP 149/73  Pulse 84  Ht 5' 9.5" (1.765 m)  Wt 203 lb (92.08 kg)  BMI 29.56 kg/m2 Last Weight:  Wt Readings from Last 1 Encounters:  06/05/13 203 lb (92.08 kg)   Last Height:   Ht Readings from Last  1 Encounters:  06/05/13 5' 9.5" (1.765 m)

## 2013-06-05 NOTE — Patient Instructions (Signed)
I had a long discussion with her patient and wife regarding his recurrent spells has or hasn't results of your vascular imaging as well as EEG and answered questions. I recommend discontinuing aspirin and continuing Plavix alone for secondary stroke prevention. He upcoming appointment with Dr.Dohmier next month. No followup appointment is necessary with me.

## 2013-06-26 DIAGNOSIS — I951 Orthostatic hypotension: Secondary | ICD-10-CM | POA: Diagnosis not present

## 2013-06-26 DIAGNOSIS — I209 Angina pectoris, unspecified: Secondary | ICD-10-CM | POA: Diagnosis not present

## 2013-06-26 DIAGNOSIS — Z79899 Other long term (current) drug therapy: Secondary | ICD-10-CM | POA: Diagnosis not present

## 2013-06-26 DIAGNOSIS — I119 Hypertensive heart disease without heart failure: Secondary | ICD-10-CM | POA: Diagnosis not present

## 2013-06-26 DIAGNOSIS — I2510 Atherosclerotic heart disease of native coronary artery without angina pectoris: Secondary | ICD-10-CM | POA: Diagnosis not present

## 2013-06-26 DIAGNOSIS — Z951 Presence of aortocoronary bypass graft: Secondary | ICD-10-CM | POA: Diagnosis not present

## 2013-06-26 DIAGNOSIS — Z8673 Personal history of transient ischemic attack (TIA), and cerebral infarction without residual deficits: Secondary | ICD-10-CM | POA: Diagnosis not present

## 2013-06-26 DIAGNOSIS — E785 Hyperlipidemia, unspecified: Secondary | ICD-10-CM | POA: Diagnosis not present

## 2013-07-14 DIAGNOSIS — S20219A Contusion of unspecified front wall of thorax, initial encounter: Secondary | ICD-10-CM | POA: Diagnosis not present

## 2013-07-17 ENCOUNTER — Encounter: Payer: Self-pay | Admitting: Neurology

## 2013-07-17 ENCOUNTER — Ambulatory Visit (INDEPENDENT_AMBULATORY_CARE_PROVIDER_SITE_OTHER): Payer: Medicare Other | Admitting: Neurology

## 2013-07-17 VITALS — BP 130/62 | HR 75 | Resp 18 | Ht 69.0 in | Wt 204.0 lb

## 2013-07-17 DIAGNOSIS — G4752 REM sleep behavior disorder: Secondary | ICD-10-CM

## 2013-07-17 MED ORDER — CLONAZEPAM 0.5 MG PO TABS: 0.5000 mg | ORAL_TABLET | Freq: Every day | ORAL | Status: DC

## 2013-07-17 NOTE — Progress Notes (Signed)
[facility]  Provider:  Larey Seat, M D  Referring Provider: Gus Height, MD Primary Care Physician:  Gus Height, MD  Chief Complaint  Patient presents with  . Follow-up    Room 10  . Transient Ischemic Attack    HPI:  Clinton Hughes is a 80 y.o. male  Is seen here as a referral/ revisit  after  TIA versus  "mini seizures'. Update 06/05/13 : He returns for followup last with Dr. Leonie Man in dec 2014, had a negative cardiac monitor with Dr. Wynonia Lawman, negative stroke work up with Dr.Sethi.    He is accompanied by his wife. The patient has not had any further recurrent episodes of transient TIA versus seizures. His EEG ( double length) after sleep deprivation  suggested left sided slowing, Dr. Jannifer Franklin interpreted.   He states that yesterday as well as a week prior he had transient tingling in the left little finger but did this did not spread to involve the left, the face and I am unclear whether this is necessarily cerebrovascular.  He did have a followup EEG done on 03/23/13 read by Dr. Jannifer Franklin,  which showed mild posterior left more than right  delta slowing -but no definite epileptiform features were noted.  Patient wore 30 day heart monitor which was apparently negative for atrial fibrillation.  He is toleratingPlavix without significant bleeding or bruising, after Dr. Leonie Man removed the aspirin form his medication list, . The  brain MRI scans obtained during this episodes have not shown any definite acute infarct. TE has shown a patent foramen ovale which is likely an incidental finding. He has dizziness spells, a fall and no fractures. He fell on his left shoulder. 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 He reports vivid dreams and sleep talking , but this was worse before last year, when he had snored loudly( and this is less evident now).  He has had 2 EEGs which have been negative for  seizures. Dr. Leonie Man  a lot of time explained to the patient and wife that the exact etiology of this episodes is indeterminate at the present time and I do not recommend any further diagnostic workup.   HPI:  Last visit Dr. Brett Fairy 03/16/13 Clinton Hughes was just hospitalized at [facility] as of October 2014 for recurrent spells, possible TIAs.  He suffered a total of 6 spells meanwhile. His cardiologist Dr. Tollie Eth , reported that while he was  In office with the patient, the patient  reported having had 2 more spells since discharge from the hospital.  These spells  Are associated with speech arrest, left facial numbness, and hand numbness -  dysesthesias off pins and needles sensations usually on the left side, left hand .  His wife noted he can" walk out of this spells" , they seem to resolve quicker when he keeps moving.  On Saturday he responded to a phone call, but was apraxic as he tried to respond , had not  lifted the receiver -  Did not know what to do with the ringing phone.  He could not see the dials, did not find the Talk button and had speech arrest when trying to communicate with his spouse.  Neurology was consulted , and noted some tremors , these  resolved with Ativan.  The patient was also changed from aspirin which he took until October,  to the  generic form of Plavix.  This was meant for secondary stroke prevention , but again , a primary stroke was not identified -so far all spells have been temporary with complete resolution .  Review of the MRI showed no intracranial abnormalities but some advanced atrophy,  This finding is likely age related.  Troponin levels were normal, liver function tests are normal, CBC was normal - he was a slight bit anemic.   Aphasia and apraxia, left sided numbness - not related to one single brain area.       Past Medical History  Diagnosis Date  . Hypertension     benign  . Angina at rest   . Coronary artery disease   .  Movement disorder   . H/O hiatal hernia   . High cholesterol   . Pneumonia 2014 X1  . Pneumonia and influenza 2013 X2  . GERD (gastroesophageal reflux disease)   . Arthritis     "neck and left shoulder" (02/14/2013)  . TIA (transient ischemic attack) 03/02/2013    Spelled of speech arrest during hospitalization in October of this year. Dr. that he was consult and in the hospital. Dr. Wynonia Lawman just applied a cardiac monitor and performed an echocardiogram. Final approval of proximal stenosis has been seen. Patient here today to followup after hospitalization and more presumed TIAs with speech arrest numbness of her hands lips right side of the face.4  events i    Past Surgical History  Procedure Laterality Date  . Colonoscopy  2006/2008  . Lumbar laminectomy  1980    "L5?" (02/14/2013)  . Cataract extraction w/ intraocular lens  implant, bilateral Bilateral 2007-2010  . Electrocardiogram  2006  . Back surgery    . Coronary artery bypass graft  1995    "CABG X5" (02/14/2013)  . Refractive surgery Right ~ 02/07/2013    "to clear a couple clouds off" (02/14/2013)  . Tee without cardioversion N/A 03/08/2013    Procedure: TRANSESOPHAGEAL ECHOCARDIOGRAM (TEE);  Surgeon: Fay Records, MD;  Location: [facility] ENDOSCOPY;  Service: Cardiovascular;  Laterality: N/A;    Current Outpatient Prescriptions  Medication Sig Dispense Refill  . amLODipine (NORVASC) 5 MG tablet Take 2.5 mg by mouth daily.       . B Complex-C (SUPER B COMPLEX PO) Take 1 capsule by mouth daily.      . clopidogrel (PLAVIX) 75 MG tablet Take 1 tablet (75 mg total) by mouth daily.  90 tablet  3  . ibuprofen (ADVIL,MOTRIN) 200 MG tablet Take 600 mg by mouth every 6 (six) hours as needed for pain.      . metoprolol (LOPRESSOR) 50 MG tablet Take 50 mg by mouth 2 (two) times daily.      Marland Kitchen omeprazole (PRILOSEC) 40 MG capsule Take 40 mg by mouth 2 (two) times daily. Delayed release, once daily      . rosuvastatin (CRESTOR) 20 MG tablet Take  20 mg by mouth daily.      . valsartan (DIOVAN) 160 MG tablet Take 160 mg by mouth daily.       No current facility-administered medications for this visit.    Allergies as of 07/17/2013  . (No Known Allergies)    Vitals: BP 130/62  Pulse 75  Resp 18  Ht 5\' 9"  (1.753 m)  Wt 204 lb (92.534 kg)  BMI 30.11 kg/m2 Last Weight:  Wt Readings from Last 1 Encounters:  07/17/13 204 lb (92.534 kg)   Last Height:   Ht Readings from Last  1 Encounters:  07/17/13 5\' 9"  (1.753 m)     Last visit exam-  Pupils are equal and briskly reactive to light. Funduscopic exam status post cataract - Without evidence of pallor or edema. Extraocular movements in vertical and horizontal planes intact and without nystagmus.  Visual fields by finger perimetry are intact.  Hearing to finger rub intact. Facial sensation intact to fine touch. Facial motor strength is symmetric and tongue and uvula barely visible. No tremor of the tongue, move midline.  Motor exam: generalized elevated tone and cog-wheeling - normal muscle bulk and strength in all extremities.  Sensory: Fine touch, pinprick normal .  slightly propulsive gait and lack of rotation in the lower spine.  His wife noticed that he does not provide armswing at home, but he presented with armswing here doing his walking exercises.  He could walk 40 yards in the 12 seconds, and current 180 with 4 steps  He was able to rise from a chair with his arms crossed in front of his chest. He resumed an erect posture when standing. There was no pill rolling tremor noticed when the patient walked.  Deep tendon reflexes: in the upper and lower extremities are symmetric and intact. Babinski maneuver response is downgoing.    Assessment : 43 Caucasian male with recurrent transient stereotypical spells of indeterminate etiology TIAs versus simple partial seizures with negative neurovascular imaging as well as repeat EEGs. There appears to be a decreasing frequency of  these episodes   PLAN : I had a long discussion with her patient and wife regarding his recurrent spells has or hasn't results of your vascular imaging as well as EEG and answered questions  I recommend continuing Plavix alone for secondary stroke prevention.  He had only one spell in the last 5 months - there is a decrescendo trend.  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 He has a prescription at home but is reluctant to take it.       Past Surgical History  Procedure Laterality Date  . Colonoscopy  2006/2008  . Lumbar laminectomy  1980    "L5?" (02/14/2013)  . Cataract extraction w/ intraocular lens  implant, bilateral Bilateral 2007-2010  . Electrocardiogram  2006  . Back surgery    . Coronary artery bypass graft  1995    "CABG X5" (02/14/2013)  . Refractive surgery Right ~ 02/07/2013    "to clear a couple clouds off" (02/14/2013)  . Tee without cardioversion N/A 03/08/2013    Procedure: TRANSESOPHAGEAL ECHOCARDIOGRAM (TEE);  Surgeon: Fay Records, MD;  Location: [facility] ENDOSCOPY;  Service: Cardiovascular;  Laterality: N/A;    Current Outpatient Prescriptions  Medication Sig Dispense Refill  . amLODipine (NORVASC) 5 MG tablet Take 2.5 mg by mouth daily.       . B Complex-C (SUPER B COMPLEX PO) Take 1 capsule by mouth daily.      . clopidogrel (PLAVIX) 75 MG tablet Take 1 tablet (75 mg total) by mouth daily.  90 tablet  3  . ibuprofen (ADVIL,MOTRIN) 200 MG tablet Take 600 mg by mouth every 6 (six) hours as needed for pain.      . metoprolol (LOPRESSOR) 50 MG tablet Take 50 mg by mouth 2 (two) times daily.      Marland Kitchen omeprazole (PRILOSEC) 40 MG capsule Take 40 mg by mouth 2 (two) times daily. Delayed release, once daily      .  rosuvastatin (CRESTOR) 20 MG tablet Take 20 mg by mouth daily.      . valsartan (DIOVAN) 160 MG tablet Take 160 mg by mouth daily.       No current facility-administered  medications for this visit.    Allergies as of 07/17/2013  . (No Known Allergies)    Vitals: BP 130/62  Pulse 75  Resp 18  Ht 5\' 9"  (1.753 m)  Wt 204 lb (92.534 kg)  BMI 30.11 kg/m2 Last Weight:  Wt Readings from Last 1 Encounters:  07/17/13 204 lb (92.534 kg)   Last Height:   Ht Readings from Last 1 Encounters:  07/17/13 5\' 9"  (1.753 m)

## 2013-08-08 DIAGNOSIS — J069 Acute upper respiratory infection, unspecified: Secondary | ICD-10-CM | POA: Diagnosis not present

## 2013-08-11 ENCOUNTER — Inpatient Hospital Stay (HOSPITAL_COMMUNITY)
Admission: EM | Admit: 2013-08-11 | Discharge: 2013-08-15 | DRG: 243 | Disposition: A | Discharge status: Home / Self Care | Payer: Medicare Other | Attending: Cardiology | Admitting: Cardiology

## 2013-08-11 ENCOUNTER — Encounter (HOSPITAL_COMMUNITY): Payer: Self-pay | Admitting: Emergency Medicine

## 2013-08-11 DIAGNOSIS — Z8673 Personal history of transient ischemic attack (TIA), and cerebral infarction without residual deficits: Secondary | ICD-10-CM | POA: Diagnosis not present

## 2013-08-11 DIAGNOSIS — G20 Parkinson's disease: Secondary | ICD-10-CM | POA: Diagnosis not present

## 2013-08-11 DIAGNOSIS — I442 Atrioventricular block, complete: Principal | ICD-10-CM | POA: Diagnosis present

## 2013-08-11 DIAGNOSIS — Z950 Presence of cardiac pacemaker: Secondary | ICD-10-CM | POA: Diagnosis not present

## 2013-08-11 DIAGNOSIS — R059 Cough, unspecified: Secondary | ICD-10-CM | POA: Diagnosis not present

## 2013-08-11 DIAGNOSIS — Q2112 Patent foramen ovale: Secondary | ICD-10-CM

## 2013-08-11 DIAGNOSIS — R404 Transient alteration of awareness: Secondary | ICD-10-CM | POA: Diagnosis not present

## 2013-08-11 DIAGNOSIS — Z9849 Cataract extraction status, unspecified eye: Secondary | ICD-10-CM

## 2013-08-11 DIAGNOSIS — Q211 Atrial septal defect: Secondary | ICD-10-CM

## 2013-08-11 DIAGNOSIS — K219 Gastro-esophageal reflux disease without esophagitis: Secondary | ICD-10-CM | POA: Diagnosis present

## 2013-08-11 DIAGNOSIS — Z961 Presence of intraocular lens: Secondary | ICD-10-CM

## 2013-08-11 DIAGNOSIS — E871 Hypo-osmolality and hyponatremia: Secondary | ICD-10-CM | POA: Diagnosis present

## 2013-08-11 DIAGNOSIS — E785 Hyperlipidemia, unspecified: Secondary | ICD-10-CM | POA: Diagnosis not present

## 2013-08-11 DIAGNOSIS — Z7982 Long term (current) use of aspirin: Secondary | ICD-10-CM | POA: Diagnosis not present

## 2013-08-11 DIAGNOSIS — I119 Hypertensive heart disease without heart failure: Secondary | ICD-10-CM | POA: Diagnosis not present

## 2013-08-11 DIAGNOSIS — I2510 Atherosclerotic heart disease of native coronary artery without angina pectoris: Secondary | ICD-10-CM | POA: Diagnosis present

## 2013-08-11 DIAGNOSIS — G20A1 Parkinson's disease without dyskinesia, without mention of fluctuations: Secondary | ICD-10-CM | POA: Diagnosis present

## 2013-08-11 DIAGNOSIS — D649 Anemia, unspecified: Secondary | ICD-10-CM | POA: Diagnosis present

## 2013-08-11 DIAGNOSIS — Z7902 Long term (current) use of antithrombotics/antiplatelets: Secondary | ICD-10-CM

## 2013-08-11 DIAGNOSIS — Z951 Presence of aortocoronary bypass graft: Secondary | ICD-10-CM | POA: Diagnosis not present

## 2013-08-11 DIAGNOSIS — I440 Atrioventricular block, first degree: Secondary | ICD-10-CM | POA: Diagnosis not present

## 2013-08-11 DIAGNOSIS — R05 Cough: Secondary | ICD-10-CM | POA: Diagnosis not present

## 2013-08-11 DIAGNOSIS — R55 Syncope and collapse: Secondary | ICD-10-CM | POA: Diagnosis not present

## 2013-08-11 DIAGNOSIS — R0989 Other specified symptoms and signs involving the circulatory and respiratory systems: Secondary | ICD-10-CM | POA: Diagnosis not present

## 2013-08-11 DIAGNOSIS — R0789 Other chest pain: Secondary | ICD-10-CM | POA: Diagnosis not present

## 2013-08-11 NOTE — ED Notes (Signed)
Patient arrives from home with complaint of chest tightness and feeling weak. EMS arrived to find patient pale, weak, bradycardic @ 40 bpm, with no complaint of pain, but stated that chest felt tight. Per EMS patient maintained blood pressures throughout encounter. HR remained low during trip, but resolved a minute or two prior to arrival. EMS explained that rhythm became erratic and fast then patient HR settled around 95. No medications were administered.

## 2013-08-12 ENCOUNTER — Emergency Department (HOSPITAL_COMMUNITY): Payer: Medicare Other

## 2013-08-12 ENCOUNTER — Encounter (HOSPITAL_COMMUNITY): Payer: Self-pay

## 2013-08-12 DIAGNOSIS — E785 Hyperlipidemia, unspecified: Secondary | ICD-10-CM | POA: Diagnosis not present

## 2013-08-12 DIAGNOSIS — G20 Parkinson's disease: Secondary | ICD-10-CM | POA: Diagnosis not present

## 2013-08-12 DIAGNOSIS — Q2111 Secundum atrial septal defect: Secondary | ICD-10-CM

## 2013-08-12 DIAGNOSIS — I119 Hypertensive heart disease without heart failure: Secondary | ICD-10-CM

## 2013-08-12 DIAGNOSIS — Q211 Atrial septal defect: Secondary | ICD-10-CM

## 2013-08-12 DIAGNOSIS — I442 Atrioventricular block, complete: Secondary | ICD-10-CM | POA: Diagnosis not present

## 2013-08-12 DIAGNOSIS — R05 Cough: Secondary | ICD-10-CM | POA: Diagnosis not present

## 2013-08-12 DIAGNOSIS — I2510 Atherosclerotic heart disease of native coronary artery without angina pectoris: Secondary | ICD-10-CM

## 2013-08-12 DIAGNOSIS — E871 Hypo-osmolality and hyponatremia: Secondary | ICD-10-CM | POA: Diagnosis not present

## 2013-08-12 LAB — I-STAT TROPONIN, ED: TROPONIN I, POC: 0.01 ng/mL (ref 0.00–0.08)

## 2013-08-12 LAB — CBC
HCT: 34 % — ABNORMAL LOW (ref 39.0–52.0)
HCT: 31.2 % — ABNORMAL LOW (ref 39.0–52.0)
Hemoglobin: 12.2 g/dL — ABNORMAL LOW (ref 13.0–17.0)
Hemoglobin: 11 g/dL — ABNORMAL LOW (ref 13.0–17.0)
MCH: 33.8 pg (ref 26.0–34.0)
MCH: 33.8 pg (ref 26.0–34.0)
MCHC: 35.9 g/dL (ref 30.0–36.0)
MCHC: 35.3 g/dL (ref 30.0–36.0)
MCV: 94.2 fL (ref 78.0–100.0)
MCV: 96 fL (ref 78.0–100.0)
PLATELETS: 177 10*3/uL (ref 150–400)
Platelets: 165 10*3/uL (ref 150–400)
RBC: 3.61 MIL/uL — ABNORMAL LOW (ref 4.22–5.81)
RBC: 3.25 MIL/uL — AB (ref 4.22–5.81)
RDW: 12.3 % (ref 11.5–15.5)
RDW: 12.6 % (ref 11.5–15.5)
WBC: 8.7 10*3/uL (ref 4.0–10.5)
WBC: 8.7 10*3/uL (ref 4.0–10.5)

## 2013-08-12 LAB — BASIC METABOLIC PANEL
BUN: 15 mg/dL (ref 6–23)
BUN: 16 mg/dL (ref 6–23)
CALCIUM: 8.9 mg/dL (ref 8.4–10.5)
CHLORIDE: 92 meq/L — AB (ref 96–112)
CO2: 17 mEq/L — ABNORMAL LOW (ref 19–32)
CO2: 20 meq/L (ref 19–32)
CREATININE: 0.93 mg/dL (ref 0.50–1.35)
CREATININE: 1.01 mg/dL (ref 0.50–1.35)
Calcium: 8.8 mg/dL (ref 8.4–10.5)
Chloride: 91 mEq/L — ABNORMAL LOW (ref 96–112)
GFR calc Af Amer: 86 mL/min — ABNORMAL LOW (ref >90)
GFR calc non Af Amer: 66 mL/min — ABNORMAL LOW (ref >90)
GFR calc non Af Amer: 74 mL/min — ABNORMAL LOW (ref >90)
GFR, EST AFRICAN AMERICAN: 76 mL/min — AB (ref >90)
GLUCOSE: 121 mg/dL — AB (ref 70–99)
Glucose, Bld: 109 mg/dL — ABNORMAL HIGH (ref 70–99)
Potassium: 4.8 mEq/L (ref 3.7–5.3)
Potassium: 4.8 mEq/L (ref 3.7–5.3)
Sodium: 127 mEq/L — ABNORMAL LOW (ref 137–147)
Sodium: 126 mEq/L — ABNORMAL LOW (ref 137–147)

## 2013-08-12 LAB — DIFFERENTIAL
Basophils Absolute: 0 10*3/uL (ref 0.0–0.1)
Basophils Relative: 0 % (ref 0–1)
EOS ABS: 0.2 10*3/uL (ref 0.0–0.7)
Eosinophils Relative: 2 % (ref 0–5)
LYMPHS PCT: 13 % (ref 12–46)
Lymphs Abs: 1.3 10*3/uL (ref 0.7–4.0)
Monocytes Absolute: 1.1 10*3/uL — ABNORMAL HIGH (ref 0.1–1.0)
Monocytes Relative: 11 % (ref 3–12)
NEUTROS PCT: 74 % (ref 43–77)
Neutro Abs: 7.6 10*3/uL (ref 1.7–7.7)

## 2013-08-12 LAB — PROTIME-INR
INR: 1.11 (ref 0.00–1.49)
Prothrombin Time: 14.1 seconds (ref 11.6–15.2)

## 2013-08-12 LAB — TSH: TSH: 1.998 u[IU]/mL (ref 0.350–4.500)

## 2013-08-12 LAB — T4, FREE: Free T4: 1.13 ng/dL (ref 0.80–1.80)

## 2013-08-12 LAB — TROPONIN I
Troponin I: <0.3 ng/mL (ref <0.30)
Troponin I: <0.3 ng/mL (ref <0.30)
Troponin I: <0.3 ng/mL (ref <0.30)

## 2013-08-12 LAB — I-STAT CG4 LACTIC ACID, ED: LACTIC ACID, VENOUS: 1.67 mmol/L (ref 0.5–2.2)

## 2013-08-12 LAB — PRO B NATRIURETIC PEPTIDE: PRO B NATRI PEPTIDE: 573.3 pg/mL — AB (ref 0–450)

## 2013-08-12 LAB — MRSA PCR SCREENING: MRSA BY PCR: NEGATIVE

## 2013-08-12 MED ORDER — IRBESARTAN 150 MG PO TABS
150.0000 mg | ORAL_TABLET | Freq: Every day | ORAL | Status: DC
  Administered 2013-08-12 – 2013-08-15 (×4): 150 mg via ORAL
  Filled 2013-08-12 (×4): qty 1

## 2013-08-12 MED ORDER — CLOPIDOGREL BISULFATE 75 MG PO TABS
75.0000 mg | ORAL_TABLET | Freq: Every day | ORAL | Status: DC
  Administered 2013-08-12 – 2013-08-15 (×4): 75 mg via ORAL
  Filled 2013-08-12 (×4): qty 1

## 2013-08-12 MED ORDER — CLONAZEPAM 0.5 MG PO TABS
0.5000 mg | ORAL_TABLET | Freq: Every day | ORAL | Status: DC
  Administered 2013-08-12 – 2013-08-14 (×3): 0.5 mg via ORAL
  Filled 2013-08-12 (×3): qty 1

## 2013-08-12 MED ORDER — PANTOPRAZOLE SODIUM 40 MG PO TBEC
80.0000 mg | DELAYED_RELEASE_TABLET | Freq: Every day | ORAL | Status: DC
  Administered 2013-08-12 – 2013-08-15 (×4): 80 mg via ORAL
  Filled 2013-08-12 (×5): qty 2

## 2013-08-12 MED ORDER — ENOXAPARIN SODIUM 40 MG/0.4ML ~~LOC~~ SOLN
40.0000 mg | SUBCUTANEOUS | Status: DC
  Administered 2013-08-12 – 2013-08-13 (×2): 40 mg via SUBCUTANEOUS
  Filled 2013-08-12 (×3): qty 0.4

## 2013-08-12 MED ORDER — GUAIFENESIN ER 600 MG PO TB12
600.0000 mg | ORAL_TABLET | Freq: Two times a day (BID) | ORAL | Status: DC
  Administered 2013-08-12 – 2013-08-15 (×7): 600 mg via ORAL
  Filled 2013-08-12 (×9): qty 1

## 2013-08-12 MED ORDER — AMOXICILLIN 250 MG/5ML PO SUSR
875.0000 mg | Freq: Two times a day (BID) | ORAL | Status: DC
  Administered 2013-08-12 – 2013-08-13 (×4): 875 mg via ORAL
  Filled 2013-08-12 (×6): qty 20

## 2013-08-12 MED ORDER — ATORVASTATIN CALCIUM 40 MG PO TABS
40.0000 mg | ORAL_TABLET | Freq: Every day | ORAL | Status: DC
  Administered 2013-08-12 – 2013-08-14 (×3): 40 mg via ORAL
  Filled 2013-08-12 (×5): qty 1

## 2013-08-12 MED ORDER — ASPIRIN EC 81 MG PO TBEC
81.0000 mg | DELAYED_RELEASE_TABLET | Freq: Every day | ORAL | Status: DC
  Administered 2013-08-13: 81 mg via ORAL
  Filled 2013-08-12 (×2): qty 1

## 2013-08-12 MED ORDER — NITROGLYCERIN 0.4 MG SL SUBL: 0.4000 mg | SUBLINGUAL_TABLET | SUBLINGUAL | Status: DC | PRN

## 2013-08-12 MED ORDER — ASPIRIN 81 MG PO CHEW
324.0000 mg | CHEWABLE_TABLET | Freq: Once | ORAL | Status: AC
  Filled 2013-08-12: qty 4

## 2013-08-12 MED ORDER — LORATADINE 10 MG PO TABS
10.0000 mg | ORAL_TABLET | Freq: Every day | ORAL | Status: DC
  Administered 2013-08-12 – 2013-08-15 (×4): 10 mg via ORAL
  Filled 2013-08-12 (×5): qty 1

## 2013-08-12 MED ORDER — AMLODIPINE BESYLATE 2.5 MG PO TABS
2.5000 mg | ORAL_TABLET | Freq: Every day | ORAL | Status: DC
  Administered 2013-08-12 – 2013-08-15 (×4): 2.5 mg via ORAL
  Filled 2013-08-12 (×4): qty 1

## 2013-08-12 NOTE — ED Provider Notes (Signed)
CSN: [id number]     Arrival date & time 08/11/13  2333 History   First MD Initiated Contact with Patient 08/12/13 0120     Chief Complaint  Patient presents with  . Bradycardia     (Consider location/radiation/quality/duration/timing/severity/associated sxs/prior Treatment) The history is provided by the patient.   80 year old male an episode at about 3 PM where he felt weak and dizzy and off balance. He could not walk without holding on but he did not fall and one direction or another preferentially. There was mild associated chest soreness which she rated at 2/10 which resolved fairly quickly. There is no associated dyspnea or nausea but he was diaphoretic. His wife checked his blood pressure and the blood pressure was normal but his heart rate was 38. EMS was called and also noted slow heart rate he but it became normal just as he arrived in the ED. He had been admitted to the hospital 5 months ago for a questionable seizure, questionable TIA but states that these symptoms are different. He denies headache and denies any other pain. He does relate that he was started on amoxicillin 3 days ago and this was given for possible pneumonia. He has had a cough productive of a small amount of yellowish sputum but no fever or chills and no dyspnea.  Past Medical History  Diagnosis Date  . Hypertension     benign  . Angina at rest   . Coronary artery disease   . Movement disorder   . H/O hiatal hernia   . High cholesterol   . Pneumonia 2014 X1  . Pneumonia and influenza 2013 X2  . GERD (gastroesophageal reflux disease)   . Arthritis     "neck and left shoulder" (02/14/2013)  . TIA (transient ischemic attack) 03/02/2013    Spelled of speech arrest during hospitalization in October of this year. Dr. that he was consult and in the hospital. Dr. Wynonia Lawman just applied a cardiac monitor and performed an echocardiogram. Final approval of proximal stenosis has been seen. Patient here today to followup  after hospitalization and more presumed TIAs with speech arrest numbness of her hands lips right side of the face.4  events i   Past Surgical History  Procedure Laterality Date  . Colonoscopy  2006/2008  . Lumbar laminectomy  1980    "L5?" (02/14/2013)  . Cataract extraction w/ intraocular lens  implant, bilateral Bilateral 2007-2010  . Electrocardiogram  2006  . Back surgery    . Coronary artery bypass graft  1995    "CABG X5" (02/14/2013)  . Refractive surgery Right ~ 02/07/2013    "to clear a couple clouds off" (02/14/2013)  . Tee without cardioversion N/A 03/08/2013    Procedure: TRANSESOPHAGEAL ECHOCARDIOGRAM (TEE);  Surgeon: Fay Records, MD;  Location: [facility] ENDOSCOPY;  Service: Cardiovascular;  Laterality: N/A;   Family History  Problem Relation Age of Onset  . Thyroid disease Sister   . Thyroid disease Other   . Epilepsy Grandchild   . Sleep walking Daughter    History  Substance Use Topics  . Smoking status: Former Smoker -- 0.25 packs/day for 6 years    Types: Cigarettes  . Smokeless tobacco: Never Used     Comment: 02/14/2013 "quit smoking ~ 1985"  . Alcohol Use: 0.0 oz/week     Comment: occas    Review of Systems  All other systems reviewed and are negative.      Allergies  Review of patient's allergies indicates no known allergies.  Home Medications   Current Outpatient Rx  Name  Route  Sig  Dispense  Refill  . amLODipine (NORVASC) 5 MG tablet   Oral   Take 2.5 mg by mouth daily.          Marland Kitchen amoxicillin (AMOXIL) 875 MG tablet   Oral   Take 875 mg by mouth 2 (two) times daily. For 10 days         . aspirin EC 325 MG tablet   Oral   Take 325 mg by mouth once.         . B Complex-C (SUPER B COMPLEX PO)   Oral   Take 1 capsule by mouth daily.         . clonazePAM (KLONOPIN) 0.5 MG tablet   Oral   Take 1 tablet (0.5 mg total) by mouth at bedtime. Prn at night 1/2 tablet for REM BD.   30 tablet   3   . clopidogrel (PLAVIX) 75 MG tablet    Oral   Take 1 tablet (75 mg total) by mouth daily.   90 tablet   3   . Fexofenadine HCl (ALLEGRA PO)   Oral   Take 1 tablet by mouth daily.         Marland Kitchen guaiFENesin (MUCINEX) 600 MG 12 hr tablet   Oral   Take 600 mg by mouth 2 (two) times daily.         Marland Kitchen ibuprofen (ADVIL,MOTRIN) 200 MG tablet   Oral   Take 200 mg by mouth 2 (two) times daily as needed for moderate pain.         . metoprolol (LOPRESSOR) 50 MG tablet   Oral   Take 50 mg by mouth 2 (two) times daily.         Marland Kitchen omeprazole (PRILOSEC) 40 MG capsule   Oral   Take 40 mg by mouth daily.         . rosuvastatin (CRESTOR) 20 MG tablet   Oral   Take 20 mg by mouth daily.         . valsartan (DIOVAN) 160 MG tablet   Oral   Take 160 mg by mouth daily.          BP 145/59  Pulse 91  Temp(Src) 97.4 F (36.3 C) (Oral)  Resp 21  SpO2 95% Physical Exam  Nursing note and vitals reviewed.  80 year old male, resting comfortably and in no acute distress. Vital signs are  significant for hypertension with blood pressure 145/59, and borderline tachypnea with respiratory rate of 21 . Oxygen saturation is 95%, which is normal. Head is normocephalic and atraumatic. PERRLA, EOMI. Oropharynx is clear. Neck is nontender and supple without adenopathy or JVD. Back is nontender and there is no CVA tenderness. Lungs are clear without rales, wheezes, or rhonchi. Chest is nontender. Heart has regular rate and rhythm without murmur. Abdomen is soft, flat, nontender without masses or hepatosplenomegaly and peristalsis is normoactive. Extremities have no cyanosis or edema, full range of motion is present. Skin is warm and dry without rash. Neurologic: Mental status is normal, cranial nerves are intact, there are no motor or sensory deficits.  ED Course  Procedures (including critical care time) Labs Review Results for orders placed during the hospital encounter of 08/11/13  MRSA PCR SCREENING      Result Value Ref Range    MRSA by PCR NEGATIVE  NEGATIVE  CBC      Result Value Ref Range  WBC 8.7  4.0 - 10.5 K/uL   RBC 3.61 (*) 4.22 - 5.81 MIL/uL   Hemoglobin 12.2 (*) 13.0 - 17.0 g/dL   HCT 34.0 (*) 39.0 - 52.0 %   MCV 94.2  78.0 - 100.0 fL   MCH 33.8  26.0 - 34.0 pg   MCHC 35.9  30.0 - 36.0 g/dL   RDW 12.3  11.5 - 15.5 %   Platelets 177  150 - 400 K/uL  BASIC METABOLIC PANEL      Result Value Ref Range   Sodium 127 (*) 137 - 147 mEq/L   Potassium 4.8  3.7 - 5.3 mEq/L   Chloride 91 (*) 96 - 112 mEq/L   CO2 17 (*) 19 - 32 mEq/L   Glucose, Bld 109 (*) 70 - 99 mg/dL   BUN 15  6 - 23 mg/dL   Creatinine, Ser 1.01  0.50 - 1.35 mg/dL   Calcium 8.9  8.4 - 10.5 mg/dL   GFR calc non Af Amer 66 (*) >90 mL/min   GFR calc Af Amer 76 (*) >90 mL/min  DIFFERENTIAL      Result Value Ref Range   Neutrophils Relative % 74  43 - 77 %   Neutro Abs 7.6  1.7 - 7.7 K/uL   Lymphocytes Relative 13  12 - 46 %   Lymphs Abs 1.3  0.7 - 4.0 K/uL   Monocytes Relative 11  3 - 12 %   Monocytes Absolute 1.1 (*) 0.1 - 1.0 K/uL   Eosinophils Relative 2  0 - 5 %   Eosinophils Absolute 0.2  0.0 - 0.7 K/uL   Basophils Relative 0  0 - 1 %   Basophils Absolute 0.0  0.0 - 0.1 K/uL  I-STAT TROPOININ, ED      Result Value Ref Range   Troponin i, poc 0.01  0.00 - 0.08 ng/mL   Comment 3           I-STAT CG4 LACTIC ACID, ED      Result Value Ref Range   Lactic Acid, Venous 1.67  0.5 - 2.2 mmol/L   Imaging Review Dg Chest 2 View  08/12/2013   CLINICAL DATA:  Cough.  EXAM: CHEST  2 VIEW  COMPARISON:  07/14/2013  FINDINGS: Chronic extrapleural thickening in the upper chest bilaterally, right more than left. There is diffuse chronic interstitial coarsening. No evidence of edema or consolidation. No effusion or pneumothorax. Previous median sternotomy for CABG. No acute osseous findings. Normal heart size.  IMPRESSION: 1. No active cardiopulmonary disease. 2. Chronic lung disease.   Electronically Signed   By: Jorje Guild M.D.   On:  08/12/2013 02:32     EKG Interpretation   Date/Time:  Saturday August 11 2013 23:37:54 EDT Ventricular Rate:  94 PR Interval:  193 QRS Duration: 152 QT Interval:  387 QTC Calculation: 484 R Axis:   36 Text Interpretation:  Sinus rhythm Right bundle branch block When compared  with ECG of 02/14/2013, No significant change was found Confirmed by [facility]   MD, Lauriel Helin ([id number]) on 08/12/2013 1:21:15 AM      MDM   Final diagnoses:  Complete heart block  CAD (coronary artery disease)  Hyperlipidemia  Hypertensive heart disease  PFO (patent foramen ovale)    Episode of a bradycardia with associated dizziness and chest discomfort. I reviewed EMS rhythm strip which shows a junctional rhythm with a rate of about 40. It is noted that he is taking metoprolol 50 mg twice  a day and that may be partly responsible for this. He will need to be evaluated regarding whether her Toprol can be safely decreased in dose and/or elevated it versus inserting permanent pacemaker. Case is discussed with Dr. Zachery ConchFriedman of cardiology service who agrees to admit the patient.    Dione Boozeavid Ladarius Seubert, MD 08/12/13 [id number]

## 2013-08-12 NOTE — Progress Notes (Addendum)
H&P reviewed Pacer tomorrow.  CHB - see EMS strip in chart. Currently NSR with first degree AVB Discussed pacer with patient.  Hyponatremia noted.   Candee Furbish, MD

## 2013-08-12 NOTE — ED Notes (Signed)
Glick, MD at bedside.  

## 2013-08-12 NOTE — H&P (Signed)
Patient ID: RESEAN BRANDER MRN: [id number], DOB/AGE: 80   Admit date: 08/11/2013   Primary Physician: Gus Height, MD Primary Cardiologist: Ezzard Standing, MD  Pt. Profile: 80M with CAD s/p CABG (1995), TIA, HLD p/w pre-syncope and found to have CHB on EMS strips.    Problem List  Past Medical History  Diagnosis Date  . Hypertension     benign  . Angina at rest   . Coronary artery disease   . Movement disorder   . H/O hiatal hernia   . High cholesterol   . Pneumonia 2014 X1  . Pneumonia and influenza 2013 X2  . GERD (gastroesophageal reflux disease)   . Arthritis     "neck and left shoulder" (02/14/2013)  . TIA (transient ischemic attack) 03/02/2013    Spelled of speech arrest during hospitalization in October of this year. Dr. that he was consult and in the hospital. Dr. Wynonia Lawman just applied a cardiac monitor and performed an echocardiogram. Final approval of proximal stenosis has been seen. Patient here today to followup after hospitalization and more presumed TIAs with speech arrest numbness of her hands lips right side of the face.4  events i    Past Surgical History  Procedure Laterality Date  . Colonoscopy  2006/2008  . Lumbar laminectomy  1980    "L5?" (02/14/2013)  . Cataract extraction w/ intraocular lens  implant, bilateral Bilateral 2007-2010  . Electrocardiogram  2006  . Back surgery    . Coronary artery bypass graft  1995    "CABG X5" (02/14/2013)  . Refractive surgery Right ~ 02/07/2013    "to clear a couple clouds off" (02/14/2013)  . Tee without cardioversion N/A 03/08/2013    Procedure: TRANSESOPHAGEAL ECHOCARDIOGRAM (TEE);  Surgeon: Fay Records, MD;  Location: [facility] ENDOSCOPY;  Service: Cardiovascular;  Laterality: N/A;     Allergies  No Known Allergies  HPI 80M with CAD s/p CABG (1995), TIA, HLD p/w pre-syncope.  Mr. Behnken states that this evening while watching a ball game he became abruptly lightheaded and felt as if he may fall. He  stood up to try and "walk it off." He had associated mild chest discomfort, clamminess, sweats, DOE, and a feeling of being "feeble."  His hair was apparently drenched with sweat. Patient's wife took his vital sighs and noticed a HR in the 30s and EMS was called.   EMS rhythm strip demonstrated complete HB with a RBBB escape (same morphology as native QRS). Prior to arriving at [facility] his rhythm returned to sinus with RBBB.   On arrival to ED: P 88, 131/58. ECG demonstrated NSR and RBBB. Cardiology consulted for admission.   Of note, Mr. Southers is currently on a course of amoxicillin for "congestion." Only nodal agent he takes is metoprolol. He is on 50mg  BID. Last does this morning. Occasionally misses his evening dose but never takes extras.   Home Medications  Prior to Admission medications   Medication Sig Start Date End Date Taking? Authorizing Provider  amLODipine (NORVASC) 5 MG tablet Take 2.5 mg by mouth daily.    Yes Historical Provider, MD  amoxicillin (AMOXIL) 875 MG tablet Take 875 mg by mouth 2 (two) times daily. For 10 days 08/09/13 08/18/13 Yes Historical Provider, MD  aspirin EC 325 MG tablet Take 325 mg by mouth once.   Yes Historical Provider, MD  B Complex-C (SUPER B COMPLEX PO) Take 1 capsule by mouth daily.   Yes Historical Provider, MD  clonazePAM (KLONOPIN) 0.5 MG  tablet Take 1 tablet (0.5 mg total) by mouth at bedtime. Prn at night 1/2 tablet for REM BD. 07/17/13  Yes Larey Seat, MD  clopidogrel (PLAVIX) 75 MG tablet Take 1 tablet (75 mg total) by mouth daily. 03/16/13  Yes Carmen Dohmeier, MD  Fexofenadine HCl (ALLEGRA PO) Take 1 tablet by mouth daily.   Yes Historical Provider, MD  guaiFENesin (MUCINEX) 600 MG 12 hr tablet Take 600 mg by mouth 2 (two) times daily.   Yes Historical Provider, MD  ibuprofen (ADVIL,MOTRIN) 200 MG tablet Take 200 mg by mouth 2 (two) times daily as needed for moderate pain.   Yes Historical Provider, MD  metoprolol (LOPRESSOR) 50 MG tablet Take  50 mg by mouth 2 (two) times daily.   Yes Historical Provider, MD  omeprazole (PRILOSEC) 40 MG capsule Take 40 mg by mouth daily.   Yes Historical Provider, MD  rosuvastatin (CRESTOR) 20 MG tablet Take 20 mg by mouth daily.   Yes Historical Provider, MD  valsartan (DIOVAN) 160 MG tablet Take 160 mg by mouth daily.   Yes Historical Provider, MD    Family History  Family History  Problem Relation Age of Onset  . Thyroid disease Sister   . Thyroid disease Other   . Epilepsy Grandchild   . Sleep walking Daughter     Social History  History   Social History  . Marital Status: Married    Spouse Name: Arbie Cookey    Number of Children: 4  . Years of Education: college   Occupational History  . retired    Social History Main Topics  . Smoking status: Former Smoker -- 0.25 packs/day for 6 years    Types: Cigarettes  . Smokeless tobacco: Never Used     Comment: 02/14/2013 "quit smoking ~ 1985"  . Alcohol Use: 0.0 oz/week     Comment: occas  . Drug Use: No  . Sexual Activity: Not Currently   Other Topics Concern  . Not on file   Social History Narrative   Patient lives with his wife Arbie Cookey).   Patient has 4 children.   Patient is retired.   Patient has a college education.   Patient is right-handed.   Patient drinks 2 cups of coffee daily.     Review of Systems General:  No chills, fever,  Cardiovascular:  + No chest pain and dyspnea on exertion. No edema, orthopnea, palpitations, paroxysmal nocturnal dyspnea. Dermatological: No rash, lesions/masses Respiratory: No cough, dyspnea. + nasal congestion Urologic: No hematuria, dysuria Abdominal:   No nausea, vomiting, diarrhea, bright red blood per rectum, melena, or hematemesis Neurologic:  No visual changes, wkns, changes in mental status. All other systems reviewed and are otherwise negative except as noted above.  Physical Exam  Blood pressure 131/58, pulse 88, temperature 97.4 F (36.3 C), temperature source Oral, resp.  rate 21, SpO2 95.00%.  General: Pleasant, NAD Psych: Normal affect. Neuro: Alert and oriented X 3. Moves all extremities spontaneously. HEENT: Normal  Neck: Supple without bruits or JVD. Lungs:  Resp regular and unlabored, CTA. Heart: RRR no s3, s4, or murmurs. Abdomen: Soft, non-tender, non-distended, BS + x 4.  Extremities: No clubbing, cyanosis or edema. DP/PT/Radials 2+ and equal bilaterally.  Labs  Troponin [facility] of Care Test)  Recent Labs  08/12/13 0043  TROPIPOC 0.01   No results found for this basename: CKTOTAL, CKMB, TROPONINI,  in the last 72 hours Lab Results  Component Value Date   WBC 8.7 08/11/2013   HGB 12.2* 08/11/2013  HCT 34.0* 08/11/2013   MCV 94.2 08/11/2013   PLT 177 08/11/2013    Recent Labs Lab 08/11/13 2352  NA 127*  K 4.8  CL 91*  CO2 17*  BUN 15  CREATININE 1.01  CALCIUM 8.9  GLUCOSE 109*   Lab Results  Component Value Date   CHOL 132 02/15/2013   HDL 70 02/15/2013   LDLCALC 45 02/15/2013   TRIG 83 02/15/2013   No results found for this basename: DDIMER     Radiology/Studies  Dg Chest 2 View  08/12/2013   CLINICAL DATA:  Cough.  EXAM: CHEST  2 VIEW  COMPARISON:  07/14/2013  FINDINGS: Chronic extrapleural thickening in the upper chest bilaterally, right more than left. There is diffuse chronic interstitial coarsening. No evidence of edema or consolidation. No effusion or pneumothorax. Previous median sternotomy for CABG. No acute osseous findings. Normal heart size.  IMPRESSION: 1. No active cardiopulmonary disease. 2. Chronic lung disease.   Electronically Signed   By: Jorje Guild M.D.   On: 08/12/2013 02:32    02/15/13 TTE - Left ventricle: The cavity size was normal. Systolic function was normal. The estimated ejection fraction was in the range of 55% to 60%. Wall motion was normal; there were no regional wall motion abnormalities. - Left atrium: The atrium was severely dilated.  03/08/2013 - Left atrium: No evidence of thrombus  in the atrial cavity or appendage. - Atrial septum: PFO is present with L to R shunting by color doppler. With injection of agitated saline and cough, bubbles appeared in the L sided chambers.  ECG  11-Aug-2013 23:37:54: RBBB  ASSESSMENT AND PLAN 57M with CAD s/p CABG (1995), TIA, HLD p/w pre-sycope found to have periods of CHB in setting of metoprolol use and known infranodal conduction disease. He has an indication for BB use (CAD).  - TSH - Hold metoprolol - TTE - anticipate PPM Monday vs. Tuesday - continue amox  Signed, Lamar Sprinkles, MD 08/12/2013, 3:02 AM

## 2013-08-12 NOTE — ED Notes (Signed)
Patient states that he has been wheezing for several days. He explained that he had a bad dream and rolled off of his bed onto the floor. The incident left him with injured and sore ribs. His doctor recently placed him on ABX to stave off possible infection secondary to his rib injury. Currently does not feel short of breath, NAD.

## 2013-08-13 ENCOUNTER — Encounter (HOSPITAL_COMMUNITY)
Admission: EM | Disposition: A | Discharge status: Home / Self Care | Payer: Self-pay | Source: Home / Self Care | Attending: Cardiology

## 2013-08-13 DIAGNOSIS — E871 Hypo-osmolality and hyponatremia: Secondary | ICD-10-CM | POA: Diagnosis not present

## 2013-08-13 DIAGNOSIS — R55 Syncope and collapse: Secondary | ICD-10-CM | POA: Diagnosis not present

## 2013-08-13 DIAGNOSIS — I440 Atrioventricular block, first degree: Secondary | ICD-10-CM | POA: Diagnosis not present

## 2013-08-13 DIAGNOSIS — G20 Parkinson's disease: Secondary | ICD-10-CM | POA: Diagnosis not present

## 2013-08-13 DIAGNOSIS — I119 Hypertensive heart disease without heart failure: Secondary | ICD-10-CM | POA: Diagnosis not present

## 2013-08-13 DIAGNOSIS — I442 Atrioventricular block, complete: Secondary | ICD-10-CM | POA: Diagnosis not present

## 2013-08-13 SURGERY — PERMANENT PACEMAKER INSERTION
Anesthesia: LOCAL

## 2013-08-13 NOTE — Consult Note (Signed)
ELECTROPHYSIOLOGY CONSULT NOTE   Patient ID: Clinton Hughes MRN: [id number], DOB/AGE: [birth year]   Admit date: 08/11/2013 Date of Consult: 08/13/2013  Primary Physician: Gus Height, MD Primary Cardiologist: Wynonia Lawman, MD Reason for Consultation: Complete heart block  History of Present Illness Clinton Hughes is a 80 y.o. male with CAD s/p CABG 1995, HTN, TIAs and dyslipidemia who presented with near syncope, found to have complete heart block. Clinton Hughes reports sudden onset lightheadedness while watching a movie Saturday night. He stood up to try and "walk it off." However, he felt like he was going to fall. He also had SOB and diaphoresis. His wife took his BP and noticed his heart rate in the 30s. EMS was called. EMS initial rhythm strip demonstrated complete heart block with a RBBB escape (same morphology as native QRS). Prior to arriving at [facility] he returned to [place] with RBBB. On arrival to ED he was in SR. ECG demonstrated NSR and RBBB. Of note, Clinton Hughes is currently taking metoprolol tartrate 50 mg BID. Last dose was 08/11/2013.   Past Medical History Past Medical History  Diagnosis Date  . Hypertension     benign  . Angina at rest   . Coronary artery disease   . Movement disorder   . H/O hiatal hernia   . High cholesterol   . Pneumonia 2014 X1  . Pneumonia and influenza 2013 X2  . GERD (gastroesophageal reflux disease)   . Arthritis     "neck and left shoulder" (02/14/2013)  . TIA (transient ischemic attack) 03/02/2013    Spelled of speech arrest during hospitalization in October of this year. Dr. that he was consult and in the hospital. Dr. Wynonia Lawman just applied a cardiac monitor and performed an echocardiogram. Final approval of proximal stenosis has been seen. Patient here today to followup after hospitalization and more presumed TIAs with speech arrest numbness of her hands lips right side of the face.4  events i    Past Surgical History Past Surgical History   Procedure Laterality Date  . Colonoscopy  2006/2008  . Lumbar laminectomy  1980    "L5?" (02/14/2013)  . Cataract extraction w/ intraocular lens  implant, bilateral Bilateral 2007-2010  . Electrocardiogram  2006  . Back surgery    . Coronary artery bypass graft  1995    "CABG X5" (02/14/2013)  . Refractive surgery Right ~ 02/07/2013    "to clear a couple clouds off" (02/14/2013)  . Tee without cardioversion N/A 03/08/2013    Procedure: TRANSESOPHAGEAL ECHOCARDIOGRAM (TEE);  Surgeon: Fay Records, MD;  Location: [facility] ENDOSCOPY;  Service: Cardiovascular;  Laterality: N/A;    Allergies/Intolerances No Known Allergies  Current Home Medications      ALLEGRA PO  Take 1 tablet by mouth daily.     amLODipine 5 MG tablet  Commonly known as:  NORVASC  Take 2.5 mg by mouth daily.     amoxicillin 875 MG tablet  Commonly known as:  AMOXIL  Take 875 mg by mouth 2 (two) times daily. For 10 days     aspirin EC 325 MG tablet  Take 325 mg by mouth once.     clonazePAM 0.5 MG tablet  Commonly known as:  KLONOPIN  Take 1 tablet (0.5 mg total) by mouth at bedtime. Prn at night 1/2 tablet for REM BD.     clopidogrel 75 MG tablet  Commonly known as:  PLAVIX  Take 1 tablet (75 mg total) by mouth daily.  CRESTOR 20 MG tablet  Generic drug:  rosuvastatin  Take 20 mg by mouth daily.     DIOVAN 160 MG tablet  Generic drug:  valsartan  Take 160 mg by mouth daily.     guaiFENesin 600 MG 12 hr tablet  Commonly known as:  MUCINEX  Take 600 mg by mouth 2 (two) times daily.     ibuprofen 200 MG tablet  Commonly known as:  ADVIL,MOTRIN  Take 200 mg by mouth 2 (two) times daily as needed for moderate pain.     metoprolol 50 MG tablet  Commonly known as:  LOPRESSOR  Take 50 mg by mouth 2 (two) times daily.     omeprazole 40 MG capsule  Commonly known as:  PRILOSEC  Take 40 mg by mouth daily.     SUPER B COMPLEX PO  Take 1 capsule by mouth daily.     Inpatient Medications . amLODipine   2.5 mg Oral Daily  . amoxicillin  875 mg Oral BID  . aspirin EC  81 mg Oral Daily  . atorvastatin  40 mg Oral q1800  . clonazePAM  0.5 mg Oral QHS  . clopidogrel  75 mg Oral Q breakfast  . enoxaparin (LOVENOX) injection  40 mg Subcutaneous Q24H  . guaiFENesin  600 mg Oral BID  . irbesartan  150 mg Oral Daily  . loratadine  10 mg Oral Daily  . pantoprazole  80 mg Oral Daily    Family History Family History  Problem Relation Age of Onset  . Thyroid disease Sister   . Thyroid disease Other   . Epilepsy Grandchild   . Sleep walking Daughter      Social History History   Social History  . Marital Status: Married    Spouse Name: Arbie Cookey    Number of Children: 4  . Years of Education: college   Occupational History  . retired    Social History Main Topics  . Smoking status: Former Smoker -- 0.25 packs/day for 6 years    Types: Cigarettes  . Smokeless tobacco: Never Used     Comment: 02/14/2013 "quit smoking ~ 1985"  . Alcohol Use: 0.0 oz/week     Comment: occas  . Drug Use: No  . Sexual Activity: Not Currently   Other Topics Concern  . Not on file   Social History Narrative   Patient lives with his wife Arbie Cookey).   Patient has 4 children.   Patient is retired.   Patient has a college education.   Patient is right-handed.   Patient drinks 2 cups of coffee daily.     Review of Systems General: No chills, fever, night sweats or weight changes  Cardiovascular:  No chest pain, dyspnea on exertion, edema, orthopnea, palpitations, paroxysmal nocturnal dyspnea Dermatological: No rash, lesions or masses Respiratory: No cough, dyspnea Urologic: No hematuria, dysuria Abdominal: No nausea, vomiting, diarrhea, bright red blood per rectum, melena, or hematemesis Neurologic: No visual changes, weakness, changes in mental status All other systems reviewed and are otherwise negative except as noted above.  Physical Exam Vitals: Blood pressure 139/63, pulse 72, temperature 98.9  F (37.2 C), temperature source Oral, resp. rate 20, height 5' 9.5" (1.765 m), weight 201 lb 11.5 oz (91.5 kg), SpO2 94.00%.  General: Well developed, well appearing 80 y.o. male in no acute distress. HEENT: Normocephalic, atraumatic. EOMs intact. Sclera nonicteric. Oropharynx clear.  Neck: Supple without bruits. No JVD. Lungs: Respirations regular and unlabored, CTA bilaterally. No wheezes, rales or rhonchi.  Heart: RRR. S1, S2 present. No murmurs, rub, S3 or S4. Abdomen: Soft, non-tender, non-distended. BS present x 4 quadrants. No hepatosplenomegaly.  Extremities: No clubbing, cyanosis or edema. DP/PT/Radials 2+ and equal bilaterally. Psych: Normal affect. Neuro: Alert and oriented X 3. Moves all extremities spontaneously. Musculoskeletal: No kyphosis. Skin: Intact. Warm and dry. No rashes or petechiae in exposed areas.   Labs  Recent Labs  08/12/13 0705 08/12/13 1135 08/12/13 2032  TROPONINI <0.30 <0.30 <0.30   Lab Results  Component Value Date   WBC 8.7 08/12/2013   HGB 11.0* 08/12/2013   HCT 31.2* 08/12/2013   MCV 96.0 08/12/2013   PLT 165 08/12/2013    Recent Labs Lab 08/12/13 0705  NA 126*  K 4.8  CL 92*  CO2 20  BUN 16  CREATININE 0.93  CALCIUM 8.8  GLUCOSE 121*    Recent Labs  08/12/13 0705  TSH 1.998    Recent Labs  08/12/13 0705  INR 1.11    Radiology/Studies Dg Chest 2 View  08/12/2013   CLINICAL DATA:  Cough.  EXAM: CHEST  2 VIEW  COMPARISON:  07/14/2013  FINDINGS: Chronic extrapleural thickening in the upper chest bilaterally, right more than left. There is diffuse chronic interstitial coarsening. No evidence of edema or consolidation. No effusion or pneumothorax. Previous median sternotomy for CABG. No acute osseous findings. Normal heart size.  IMPRESSION: 1. No active cardiopulmonary disease. 2. Chronic lung disease.   Electronically Signed   By: Jorje Guild M.D.   On: 08/12/2013 02:32    Echocardiogram  Study Conclusions - Left  ventricle: The cavity size was normal. Systolic function was normal. The estimated ejection fraction was in the range of 55% to 60%. Wall motion was normal; there were no regional wall motion abnormalities. - Left atrium: The atrium was severely dilated. Impressions: - No cardiac source of embolism was identified, but cannot be ruled out on the basis of this examination.  12-lead ECG and rhythm strips from EMS - complete heart block with wide escape rhythm at 30 bpm albeit with the same QRS configuration as during sinus ie RBBB 12-lead ECG on admission - NSR with RBBB at 94 bpm Telemetry - SR; no further AV block while here  Assessment and Plan Complete heart block Normal LVEF CAD Clinton Hughes presents with symptomatic complete heart block. This is transient. His metoprolol has been held and he has not had any further AV block. At this time there is no indication for pacing. Recommend to ambulate and continue telemetry while here. Thank you for this consultation.  ADDENDUM: Spoke with primary cardiologist, Dr. Wynonia Lawman, who states Clinton Hughes requires BB for continued management of chronic stable angina in setting of known CAD in which case he will need PPM to allow for continuation of BB therapy. Dr. Wynonia Lawman will discuss with Dr. Caryl Comes.  Signed, Ileene Hutchinson, PA-C 08/13/2013, 7:53 AM  I have spoken with Dr Wynonia Lawman who felt that there were no other pharmacological alternatives for managing the angina other than the Beta blocker, hence we will proceed with pacing   With TIA we will also be able to follow Atrial rhythm to see if any AFib which might have contributed to his TIA

## 2013-08-13 NOTE — Progress Notes (Signed)
Subjective:  Feels OK today.  He has had some episodes suggestive of TIA's and wore an event monitor and had an ECHO recently that were unremarkable.  Objective:  Vital Signs in the last 24 hours: BP 175/60  Pulse 72  Temp(Src) 98.4 F (36.9 C) (Oral)  Resp 20  Ht 5' 9.5" (1.765 m)  Wt 91.5 kg (201 lb 11.5 oz)  BMI 29.37 kg/m2  SpO2 96%  Physical Exam: Pleasant WM in NAD Lungs:  Clear  Cardiac:  Regular rhythm, normal S1 and S2, no S3 Abdomen:  Soft, nontender, no masses Extremities:  No edema present  Intake/Output from previous day: 03/29 0701 - 03/30 0700 In: 1250 [P.O.:1250] Out: -  Weight Filed Weights   08/12/13 0539 08/13/13 0500  Weight: 93.7 kg (206 lb 9.1 oz) 91.5 kg (201 lb 11.5 oz)    Lab Results: Basic Metabolic Panel:  Recent Labs  08/11/13 2352 08/12/13 0705  NA 127* 126*  K 4.8 4.8  CL 91* 92*  CO2 17* 20  GLUCOSE 109* 121*  BUN 15 16  CREATININE 1.01 0.93    CBC:  Recent Labs  08/11/13 2352 08/12/13 0200 08/12/13 0705  WBC 8.7  --  8.7  NEUTROABS  --  7.6  --   HGB 12.2*  --  11.0*  HCT 34.0*  --  31.2*  MCV 94.2  --  96.0  PLT 177  --  165    BNP    Component Value Date/Time   PROBNP 573.3* 08/12/2013 0705    PROTIME: Lab Results  Component Value Date   INR 1.11 08/12/2013    Telemetry:  Sinus with RBBB  Assessment/Plan:  1. Intermittent complete heart block 2. Prior CABG and CAD 3. Hyponatremia -  He says this was found in past year by primary MD.  I will talk to him and find out what work up was done.  Rec;  Pacer today.  Prefer [employer].  Kerry Hough  MD [facility] Cardiology  08/13/2013, 9:13 AM

## 2013-08-13 NOTE — Progress Notes (Signed)
  Echocardiogram 2D Echocardiogram has been performed.  Diamond Nickel 08/13/2013, 12:24 PM

## 2013-08-14 ENCOUNTER — Encounter (HOSPITAL_COMMUNITY)
Admission: EM | Disposition: A | Discharge status: Home / Self Care | Payer: Self-pay | Source: Home / Self Care | Attending: Cardiology

## 2013-08-14 ENCOUNTER — Ambulatory Visit (HOSPITAL_COMMUNITY): Admit: 2013-08-14 | Payer: Self-pay | Admitting: Internal Medicine

## 2013-08-14 DIAGNOSIS — Z950 Presence of cardiac pacemaker: Secondary | ICD-10-CM | POA: Diagnosis not present

## 2013-08-14 DIAGNOSIS — I442 Atrioventricular block, complete: Secondary | ICD-10-CM | POA: Diagnosis not present

## 2013-08-14 HISTORY — PX: PACEMAKER INSERTION: SHX728

## 2013-08-14 HISTORY — PX: PERMANENT PACEMAKER INSERTION: SHX5480

## 2013-08-14 SURGERY — PERMANENT PACEMAKER INSERTION
Anesthesia: LOCAL

## 2013-08-14 MED ORDER — HYDRALAZINE HCL 10 MG PO TABS
10.0000 mg | ORAL_TABLET | ORAL | Status: DC | PRN
Start: 2013-08-14 — End: 2013-08-15
  Administered 2013-08-14 (×2): 10 mg via ORAL
  Filled 2013-08-14 (×2): qty 1

## 2013-08-14 MED ORDER — CHLORHEXIDINE GLUCONATE 4 % EX LIQD: 60.0000 mL | Freq: Once | CUTANEOUS | Status: DC

## 2013-08-14 MED ORDER — SODIUM CHLORIDE 0.9 % IJ SOLN
3.0000 mL | Freq: Two times a day (BID) | INTRAMUSCULAR | Status: DC
  Administered 2013-08-14 – 2013-08-15 (×2): 3 mL via INTRAVENOUS

## 2013-08-14 MED ORDER — CHLORHEXIDINE GLUCONATE 4 % EX LIQD
60.0000 mL | Freq: Once | CUTANEOUS | Status: AC
  Administered 2013-08-14: 4 via TOPICAL

## 2013-08-14 MED ORDER — SODIUM CHLORIDE 0.9 % IR SOLN
80.0000 mg | Status: DC
  Filled 2013-08-14: qty 2

## 2013-08-14 MED ORDER — CEFAZOLIN SODIUM 1-5 GM-% IV SOLN
1.0000 g | Freq: Four times a day (QID) | INTRAVENOUS | Status: AC
  Administered 2013-08-14 – 2013-08-15 (×3): 1 g via INTRAVENOUS
  Filled 2013-08-14 (×3): qty 50

## 2013-08-14 MED ORDER — SODIUM CHLORIDE 0.9 % IV SOLN: 250.0000 mL | INTRAVENOUS | Status: DC | PRN

## 2013-08-14 MED ORDER — CEFAZOLIN SODIUM-DEXTROSE 2-3 GM-% IV SOLR
2.0000 g | INTRAVENOUS | Status: AC
  Administered 2013-08-14: 2 g via INTRAVENOUS
  Filled 2013-08-14: qty 50

## 2013-08-14 MED ORDER — FENTANYL CITRATE 0.05 MG/ML IJ SOLN
INTRAMUSCULAR | Status: AC
  Filled 2013-08-14: qty 2

## 2013-08-14 MED ORDER — SODIUM CHLORIDE 0.9 % IV SOLN
INTRAVENOUS | Status: DC
  Administered 2013-08-14: 50 mL/h via INTRAVENOUS

## 2013-08-14 MED ORDER — ACETAMINOPHEN 325 MG PO TABS: 325.0000 mg | ORAL_TABLET | ORAL | Status: DC | PRN

## 2013-08-14 MED ORDER — LIDOCAINE HCL (PF) 1 % IJ SOLN
INTRAMUSCULAR | Status: AC
  Filled 2013-08-14: qty 60

## 2013-08-14 MED ORDER — MIDAZOLAM HCL 5 MG/5ML IJ SOLN
INTRAMUSCULAR | Status: AC
  Filled 2013-08-14: qty 5

## 2013-08-14 MED ORDER — SODIUM CHLORIDE 0.9 % IJ SOLN: 3.0000 mL | INTRAMUSCULAR | Status: DC | PRN

## 2013-08-14 MED ORDER — HYDROCODONE-ACETAMINOPHEN 5-325 MG PO TABS: 1.0000 | ORAL_TABLET | ORAL | Status: DC | PRN

## 2013-08-14 MED ORDER — ONDANSETRON HCL 4 MG/2ML IJ SOLN: 4.0000 mg | Freq: Four times a day (QID) | INTRAMUSCULAR | Status: DC | PRN

## 2013-08-14 MED ORDER — CHLORHEXIDINE GLUCONATE 4 % EX LIQD
CUTANEOUS | Status: AC
  Filled 2013-08-14: qty 15

## 2013-08-14 NOTE — Progress Notes (Signed)
Report given to receiving RN. Family member at bed side. No verbal complaints. Patient in bed resting. No signs or symptoms of distress or discomfort.

## 2013-08-14 NOTE — Progress Notes (Signed)
Appreciate Dr. Jackalyn Lombard help today.  Resting well after pacer insertion today.  Telemetry OK.  Kerry Hough MD [facility]

## 2013-08-14 NOTE — Progress Notes (Signed)
SUBJECTIVE: The patient is doing well today.  At this time, he denies chest pain, shortness of breath, or any new concerns.  Dr Wynonia Lawman - primary cardiologist - states beta blockers necessary long term for chronic angina.  Echo 08-13-13 demonstrated EF 55-60%, moderate concentric hypertrophy, no RWMA, grade 1 diastolic dysfunction.  CURRENT MEDICATIONS: . amLODipine  2.5 mg Oral Daily  . amoxicillin  875 mg Oral BID  . aspirin EC  81 mg Oral Daily  . atorvastatin  40 mg Oral q1800  . clonazePAM  0.5 mg Oral QHS  . clopidogrel  75 mg Oral Q breakfast  . guaiFENesin  600 mg Oral BID  . irbesartan  150 mg Oral Daily  . loratadine  10 mg Oral Daily  . pantoprazole  80 mg Oral Daily   . sodium chloride      OBJECTIVE: Physical Exam: Filed Vitals:   08/13/13 2000 08/13/13 2340 08/14/13 0335 08/14/13 0400  BP: 163/56 180/70  162/71  Pulse:  62    Temp: 98.1 F (36.7 C) 97.9 F (36.6 C)  97.9 F (36.6 C)  TempSrc: Oral Oral  Oral  Resp: 14   20  Height:      Weight:   200 lb 9.9 oz (91 kg)   SpO2:  93%  96%    Intake/Output Summary (Last 24 hours) at 08/14/13 0254 Last data filed at 08/13/13 2000  Gross per 24 hour  Intake    720 ml  Output      0 ml  Net    720 ml    Telemetry reveals sinus rhythm - no further AV block  GEN- The patient is well appearing, alert and oriented x 3 today.   Head- normocephalic, atraumatic Eyes-  Sclera clear, conjunctiva pink Ears- hearing intact Oropharynx- clear Neck- supple  Lungs- Clear to ausculation bilaterally, normal work of breathing Heart- Regular rate and rhythm, no murmurs, rubs or gallops, PMI not laterally displaced GI- soft, NT, ND, + BS Extremities- no clubbing, cyanosis, or edema Skin- no rash or lesion Psych- euthymic mood, full affect Neuro- strength and sensation are intact  LABS: Basic Metabolic Panel:  Recent Labs  08/11/13 2352 08/12/13 0705  NA 127* 126*  K 4.8 4.8  CL 91* 92*  CO2 17* 20  GLUCOSE  109* 121*  BUN 15 16  CREATININE 1.01 0.93  CALCIUM 8.9 8.8   CBC:  Recent Labs  08/11/13 2352 08/12/13 0200 08/12/13 0705  WBC 8.7  --  8.7  NEUTROABS  --  7.6  --   HGB 12.2*  --  11.0*  HCT 34.0*  --  31.2*  MCV 94.2  --  96.0  PLT 177  --  165   Cardiac Enzymes:  Recent Labs  08/12/13 0705 08/12/13 1135 08/12/13 2032  TROPONINI <0.30 <0.30 <0.30   Thyroid Function Tests:  Recent Labs  08/12/13 0705  TSH 1.998    RADIOLOGY: Dg Chest 2 View 08/12/2013   CLINICAL DATA:  Cough.  EXAM: CHEST  2 VIEW  COMPARISON:  07/14/2013  FINDINGS: Chronic extrapleural thickening in the upper chest bilaterally, right more than left. There is diffuse chronic interstitial coarsening. No evidence of edema or consolidation. No effusion or pneumothorax. Previous median sternotomy for CABG. No acute osseous findings. Normal heart size.  IMPRESSION: 1. No active cardiopulmonary disease. 2. Chronic lung disease.   Electronically Signed   By: Jorje Guild M.D.   On: 08/12/2013 02:32    ASSESSMENT AND  PLAN:  Active Problems:   Complete heart block  The patient has symptomatic transient complete heart block with presyncope. He will require beta blocker therapy for management of CAD long term.  I would therefore recommend pacemaker implantation at this time.  Risks, benefits, alternatives to pacemaker implantation were discussed in detail with the patient today. The patient understands that the risks include but are not limited to bleeding, infection, pneumothorax, perforation, tamponade, vascular damage, renal failure, MI, stroke, death,  and lead dislodgement and wishes to proceed. We will therefore schedule the procedure at the next available time.

## 2013-08-14 NOTE — Op Note (Signed)
SURGEON:  Thompson Grayer, MD     PREPROCEDURE DIAGNOSIS:  Symptomatic transient complete heart block    POSTPROCEDURE DIAGNOSIS:  Symptomatic transient complete heart block     PROCEDURES:   1. Left upper extremity venography.   2. Pacemaker implantation.     INTRODUCTION: Clinton Hughes is a 80 y.o. male  with a history of symptomatic transient complete heart block who presents today for pacemaker implantation.  The patient reports intermittent episodes of presyncope over the past few days.  He has been documented to have transient complete heart block as the cause.  He has evidence of chronic degenerative conduction disease by ekg.  Though he has been previously on beta blockers, he is felt to require these long term for medical management of severe obstructive CAD and angina.  The patient therefore presents today for pacemaker implantation.     DESCRIPTION OF PROCEDURE:  Informed written consent was obtained, and the patient was brought to the electrophysiology lab in a fasting state.  The patient required no sedation for the procedure today.  The patients left chest was prepped and draped in the usual sterile fashion by the EP lab staff. The skin overlying the left deltopectoral region was infiltrated with lidocaine for local analgesia.  A 4-cm incision was made over the left deltopectoral region.  A left subcutaneous pacemaker pocket was fashioned using a combination of sharp and blunt dissection. Electrocautery was required to assure hemostasis.    Left Upper Extremity Venography: A venogram of the left upper extremity was performed, which revealed a moderate left axillary vein, which emptied into a moderate sized left subclavian vein.    RA/RV Lead Placement: The left axillary vein was therefore cannulated.  Through the left axillary vein, a Medtronic model [id number] (serial number [id number]) right atrial lead and a Medtronic model 8546- 62 (serial number [id number]) right ventricular  lead were advanced with fluoroscopic visualization into the right atrial appendage and right ventricular apex positions respectively.  Initial atrial lead P- waves measured 1.7 mV with impedance of 626 ohms and a threshold of 1.4 V at 0.5 msec.  Right ventricular lead R-waves measured 10 mV with an impedance of 928 ohms and a threshold of 0.7 V at 0.5 msec.  Both leads   were secured to the pectoralis fascia using #2-0 silk over the suture sleeves.   Device Placement:  The leads were then connected to a Medtronic Adapta L model ADDRL 1 (serial number [id number]) pacemaker.  The pocket was irrigated with copious gentamicin solution.  The pacemaker was then placed into the pocket.  The pocket was then closed in 2 layers with 2.0 Vicryl suture for the subcutaneous and subcuticular layers.  Steri- Strips and a sterile dressing were then applied.  There were no early apparent complications.     CONCLUSIONS:   1. Successful implantation of a Medtronic Adapta L dual-chamber pacemaker for symptomatic complete heart block  2. No early apparent complications.           Thompson Grayer, MD 08/14/2013 2:39 PM

## 2013-08-14 NOTE — Interval H&P Note (Signed)
History and Physical Interval Note:  08/14/2013 12:25 PM  Clinton Hughes  has presented today for surgery, with the diagnosis of Bradycardia  The various methods of treatment have been discussed with the patient and family. After consideration of risks, benefits and other options for treatment, the patient has consented to  Procedure(s): PERMANENT PACEMAKER INSERTION (N/A) as a surgical intervention .  The patient's history has been reviewed, patient examined, no change in status, stable for surgery.  I have reviewed the patient's chart and labs.  Questions were answered to the patient's satisfaction.     Thompson Grayer

## 2013-08-14 NOTE — Progress Notes (Signed)
Patient has audible wheezes and expiratory wheezes to auscultation. Patient is not in any distress and VS are stable. MD made aware. No new orders given at this time. Will continue to monitor patient for further changes in codnition.

## 2013-08-14 NOTE — H&P (View-Only) (Signed)
SUBJECTIVE: The patient is doing well today.  At this time, he denies chest pain, shortness of breath, or any new concerns.  Dr Wynonia Lawman - primary cardiologist - states beta blockers necessary long term for chronic angina.  Echo 08-13-13 demonstrated EF 55-60%, moderate concentric hypertrophy, no RWMA, grade 1 diastolic dysfunction.  CURRENT MEDICATIONS: . amLODipine  2.5 mg Oral Daily  . amoxicillin  875 mg Oral BID  . aspirin EC  81 mg Oral Daily  . atorvastatin  40 mg Oral q1800  . clonazePAM  0.5 mg Oral QHS  . clopidogrel  75 mg Oral Q breakfast  . guaiFENesin  600 mg Oral BID  . irbesartan  150 mg Oral Daily  . loratadine  10 mg Oral Daily  . pantoprazole  80 mg Oral Daily   . sodium chloride      OBJECTIVE: Physical Exam: Filed Vitals:   08/13/13 2000 08/13/13 2340 08/14/13 0335 08/14/13 0400  BP: 163/56 180/70  162/71  Pulse:  62    Temp: 98.1 F (36.7 C) 97.9 F (36.6 C)  97.9 F (36.6 C)  TempSrc: Oral Oral  Oral  Resp: 14   20  Height:      Weight:   200 lb 9.9 oz (91 kg)   SpO2:  93%  96%    Intake/Output Summary (Last 24 hours) at 08/14/13 7829 Last data filed at 08/13/13 2000  Gross per 24 hour  Intake    720 ml  Output      0 ml  Net    720 ml    Telemetry reveals sinus rhythm - no further AV block  GEN- The patient is well appearing, alert and oriented x 3 today.   Head- normocephalic, atraumatic Eyes-  Sclera clear, conjunctiva pink Ears- hearing intact Oropharynx- clear Neck- supple  Lungs- Clear to ausculation bilaterally, normal work of breathing Heart- Regular rate and rhythm, no murmurs, rubs or gallops, PMI not laterally displaced GI- soft, NT, ND, + BS Extremities- no clubbing, cyanosis, or edema Skin- no rash or lesion Psych- euthymic mood, full affect Neuro- strength and sensation are intact  LABS: Basic Metabolic Panel:  Recent Labs  08/11/13 2352 08/12/13 0705  NA 127* 126*  K 4.8 4.8  CL 91* 92*  CO2 17* 20  GLUCOSE  109* 121*  BUN 15 16  CREATININE 1.01 0.93  CALCIUM 8.9 8.8   CBC:  Recent Labs  08/11/13 2352 08/12/13 0200 08/12/13 0705  WBC 8.7  --  8.7  NEUTROABS  --  7.6  --   HGB 12.2*  --  11.0*  HCT 34.0*  --  31.2*  MCV 94.2  --  96.0  PLT 177  --  165   Cardiac Enzymes:  Recent Labs  08/12/13 0705 08/12/13 1135 08/12/13 2032  TROPONINI <0.30 <0.30 <0.30   Thyroid Function Tests:  Recent Labs  08/12/13 0705  TSH 1.998    RADIOLOGY: Dg Chest 2 View 08/12/2013   CLINICAL DATA:  Cough.  EXAM: CHEST  2 VIEW  COMPARISON:  07/14/2013  FINDINGS: Chronic extrapleural thickening in the upper chest bilaterally, right more than left. There is diffuse chronic interstitial coarsening. No evidence of edema or consolidation. No effusion or pneumothorax. Previous median sternotomy for CABG. No acute osseous findings. Normal heart size.  IMPRESSION: 1. No active cardiopulmonary disease. 2. Chronic lung disease.   Electronically Signed   By: Jorje Guild M.D.   On: 08/12/2013 02:32    ASSESSMENT AND  PLAN:  Active Problems:   Complete heart block  The patient has symptomatic transient complete heart block with presyncope. He will require beta blocker therapy for management of CAD long term.  I would therefore recommend pacemaker implantation at this time.  Risks, benefits, alternatives to pacemaker implantation were discussed in detail with the patient today. The patient understands that the risks include but are not limited to bleeding, infection, pneumothorax, perforation, tamponade, vascular damage, renal failure, MI, stroke, death,  and lead dislodgement and wishes to proceed. We will therefore schedule the procedure at the next available time.

## 2013-08-15 ENCOUNTER — Inpatient Hospital Stay (HOSPITAL_COMMUNITY): Payer: Medicare Other

## 2013-08-15 ENCOUNTER — Encounter: Payer: Self-pay | Admitting: Cardiology

## 2013-08-15 DIAGNOSIS — I442 Atrioventricular block, complete: Secondary | ICD-10-CM | POA: Diagnosis not present

## 2013-08-15 DIAGNOSIS — R0989 Other specified symptoms and signs involving the circulatory and respiratory systems: Secondary | ICD-10-CM | POA: Diagnosis not present

## 2013-08-15 DIAGNOSIS — R55 Syncope and collapse: Secondary | ICD-10-CM | POA: Diagnosis not present

## 2013-08-15 DIAGNOSIS — I440 Atrioventricular block, first degree: Secondary | ICD-10-CM | POA: Diagnosis not present

## 2013-08-15 LAB — CORTISOL: Cortisol, Plasma: 17.8 ug/dL

## 2013-08-15 LAB — BASIC METABOLIC PANEL
BUN: 16 mg/dL (ref 6–23)
CHLORIDE: 92 meq/L — AB (ref 96–112)
CO2: 19 mEq/L (ref 19–32)
Calcium: 8.8 mg/dL (ref 8.4–10.5)
Creatinine, Ser: 0.97 mg/dL (ref 0.50–1.35)
GFR calc Af Amer: 85 mL/min — ABNORMAL LOW (ref >90)
GFR calc non Af Amer: 73 mL/min — ABNORMAL LOW (ref >90)
GLUCOSE: 120 mg/dL — AB (ref 70–99)
POTASSIUM: 4.1 meq/L (ref 3.7–5.3)
Sodium: 128 mEq/L — ABNORMAL LOW (ref 137–147)

## 2013-08-15 NOTE — Care Management Note (Addendum)
  Page 2 of 2   08/15/2013     10:48:17 AM   CARE MANAGEMENT NOTE 08/15/2013  Patient:  Clinton Hughes, Clinton Hughes   Account Number:  [id number]  Date Initiated:  08/13/2013  Documentation initiated by:  Elissa Hefty  Subjective/Objective Assessment:   adm w complete heart block     Action/Plan:   lives w wife, pcp dr Harle Battiest   Anticipated DC Date:  08/15/2013   Anticipated DC Plan:  [place]  CM consult      [facility] Choice  HOME HEALTH   Choice offered to / List presented to:  C-1 Patient        [place] arranged  HH-1 RN  [place] agency  [place]   Status of service:  Completed, signed off Medicare Important Message given?   (If response is "NO", the following Medicare IM given date fields will be blank) Date Medicare IM given:   Date Additional Medicare IM given:    Discharge Disposition:  [place]  Per UR Regulation:  Reviewed for med. necessity/level of care/duration of stay  If discussed at [place] of Stay Meetings, dates discussed:   08/16/2013    Comments:  08/15/2013 CM Consult: Admitted with complete heart block, bradycardia - Heart Pacer placed 08/14/13 - arm remains in sling. Social:  From home with wife/Clinton Hughes. MED MGMT:  Self managed and uses medication box Transportation: yes PT: RECS:  HH PT Disposition:  Home with HHS:  RN, PT ([facility] Notified) Elects [facility]  - hx/o past services with [place] with patient's wife. Carliyah Cotterman RN, BSN, [place], [place] 08/15/2013

## 2013-08-15 NOTE — Discharge Summary (Signed)
Physician Discharge Summary  Patient ID: Clinton Hughes MRN: [id number] DOB/AGE: [id number] 78 y.o.  Admit date: 08/11/2013 Discharge date: 08/15/2013  Primary Physician:  C. Melinda Crutch  Primary Discharge Diagnosis:  1. Complete heart block transient associated with near syncope  Secondary Discharge Diagnosis: 2. Coronary artery disease with previous bypass grafting with patent grafts in 2010 3. Hypertensive heart disease 4. History of orthostatic hypotension 5. Hyperlipidemia 6. History of transient ischemic attacks 7. Lumbar disc disease 8. Anemia of uncertain cause 9. Hyponatremia uncertain cause 10. Parkinsonism  Procedures:  Echocardiogram, insertion of permanent pacemaker  Consults:  Cardiac electrophysiology  Hospital Course: This 80 year old male has a history of coronary artery disease with previous bypass grafting. He has normally been in good health but has been diagnosed with Parkinson's as well as a recent history of TIAs for which he has seen both knee as well as the neurologist. The being of admission while watching a basketball game became abruptly lightheaded and felt that he might fall. He stood up to try and walk it off and had associated mild discomfort clamminess sweating and felt weak. His wife took his vital signs in noted a pulse of 30 and called EMS. He was found to be in complete heart block when EMS arrived. He normally gets along fairly well. He has had intermittent spells of the fascia the have been associated with his TIAs.  The patient was placed in the step down unit. His serial troponins were all negative. He was noted to be hyponatremic however this evidently had been noted previously. He was in a normal sinus rhythm but in light of the complete heart block was seen by electrophysiology. He has a history of chronic angina for which she would need beta blocker therapy and thus a permanent pacemaker was advised. This was unable to be done due to a full  schedule until March 31 at which time he underwent placement of a Medtronic pacemaker as follows done by Dr. Rayann Heman.  RA lead  Medtronic  [id number] (serial number [id number])  RV lead  Medtronic  [id number] (serial number [id number])   Generator  Medtronic Adapta L model ADDRL 1 (serial number [id number])   He was given instructions concerning the care of the pacemaker site both verbal and written and also was given a Medtronic monitor to use for home monitoring. He was instructed not to drive for one week. The hyponatremia will need to be investigated as an outpatient Dr. Alan Ripper office as well as follow up of the anemia. Followup will be as below.  Discharge Exam: Blood pressure 162/64, pulse 81, temperature 98.7 F (37.1 C), temperature source Oral, resp. rate 20, height 5\' 9"  (1.753 m), weight 89.9 kg (198 lb 3.1 oz), SpO2 93.00%.   Lungs clear, no S3, pacemaker site is clean and dry on the day of discharge   Labs: CBC:   Lab Results  Component Value Date   WBC 8.7 08/12/2013   HGB 11.0* 08/12/2013   HCT 31.2* 08/12/2013   MCV 96.0 08/12/2013   PLT 165 08/12/2013    CMP:  Recent Labs Lab 08/15/13 0322  NA 128*  K 4.1  CL 92*  CO2 19  BUN 16  CREATININE 0.97  CALCIUM 8.8  GLUCOSE 120*    Lipid Panel     Component Value Date/Time   CHOL 132 02/15/2013 0608   TRIG 83 02/15/2013 0608   HDL 70 02/15/2013 0608   CHOLHDL 1.9 02/15/2013 0630  VLDL 17 02/15/2013 0608   LDLCALC 45 02/15/2013 0608    Cardiac Enzymes:  Recent Labs  08/12/13 1135 08/12/13 2032  TROPONINI <0.30 <0.30    BNP (last 3 results)  Recent Labs  08/12/13 0705  PROBNP 573.3*    Protime: No components found with this basename: PT,   Thyroid: Lab Results  Component Value Date   TSH 1.998 08/12/2013    Hemoglobin A1C: Lab Results  Component Value Date   HGBA1C 5.6 02/14/2013     Radiology: Cardiomegaly with some basilar scarring  EKG: Sinus rhythm with right bundle branch block,  initially complete heart block  Discharge Medications:   Medication List    STOP taking these medications       amoxicillin 875 MG tablet  Commonly known as:  AMOXIL      TAKE these medications       ALLEGRA PO  Take 1 tablet by mouth daily.     amLODipine 5 MG tablet  Commonly known as:  NORVASC  Take 2.5 mg by mouth daily.     aspirin EC 325 MG tablet  Take 325 mg by mouth once.     clonazePAM 0.5 MG tablet  Commonly known as:  KLONOPIN  Take 1 tablet (0.5 mg total) by mouth at bedtime. Prn at night 1/2 tablet for REM BD.     clopidogrel 75 MG tablet  Commonly known as:  PLAVIX  Take 1 tablet (75 mg total) by mouth daily.     CRESTOR 20 MG tablet  Generic drug:  rosuvastatin  Take 20 mg by mouth daily.     DIOVAN 160 MG tablet  Generic drug:  valsartan  Take 160 mg by mouth daily.     guaiFENesin 600 MG 12 hr tablet  Commonly known as:  MUCINEX  Take 600 mg by mouth 2 (two) times daily.     ibuprofen 200 MG tablet  Commonly known as:  ADVIL,MOTRIN  Take 200 mg by mouth 2 (two) times daily as needed for moderate pain.     metoprolol 50 MG tablet  Commonly known as:  LOPRESSOR  Take 50 mg by mouth 2 (two) times daily.     omeprazole 40 MG capsule  Commonly known as:  PRILOSEC  Take 40 mg by mouth daily.     SUPER B COMPLEX PO  Take 1 capsule by mouth daily.       Followup plans and appointments: He is to see Dr. Jackalyn Lombard office for a wound check in one week. Follow up Dr. Wynonia Lawman in 3-4 weeks,  Time spent with patient to include physician time: 30 minutes.  Signed: Kerry Hough. MD [facility] 08/15/2013, 8:54 AM

## 2013-08-15 NOTE — Discharge Instructions (Signed)
° °  Supplemental Discharge Instructions for  Pacemaker/Defibrillator Patients  Activity No heavy lifting or vigorous activity with your left arm for 6 to 8 weeks.  Do not raise your left/right arm above your head for one week.  Gradually raise your affected arm as drawn below.           04/03                      04/04                       04/05                      04/06       NO DRIVING for 1 week; you may begin driving on 70/26/3785. WOUND CARE   Keep the wound area clean and dry.  Do not get this area wet for one week. No showers for one week; you may shower on 08/23/2013.   The tape/steri-strips on your wound will fall off; do not pull them off.  No bandage is needed on the site.  DO NOT apply any creams, oils, or ointments to the wound area.   If you notice any drainage or discharge from the wound, any swelling or bruising at the site, or you develop a fever > 101? F after you are discharged home, call the office at once.  Special Instructions   You are still able to use cellular telephones; use the ear opposite the side where you have your pacemaker/defibrillator.  Avoid carrying your cellular phone near your device.   When traveling through airports, show security personnel your identification card to avoid being screened in the metal detectors.  Ask the security personnel to use the hand wand.   Avoid arc welding equipment, MRI testing (magnetic resonance imaging), TENS units (transcutaneous nerve stimulators).  Call the office for questions about other devices.   Avoid electrical appliances that are in poor condition or are not properly grounded.   Microwave ovens are safe to be near or to operate.

## 2013-08-15 NOTE — Progress Notes (Signed)
Orthopedic Tech Progress Note Patient Details:  THAI BURGUENO 1925/05/30 [id number]  Ortho Devices Type of Ortho Device: Arm sling Ortho Device/Splint Interventions: Application   Cammer, Theodoro Parma 08/15/2013, 9:19 AM

## 2013-08-15 NOTE — Progress Notes (Addendum)
    Patient: Johnell Comings Date of Encounter: 08/15/2013, 7:21 AM Admit date: 08/11/2013     Subjective  Mr. Benally reports feeling weak this AM. He denies CP or SOB.   Objective  Physical Exam: Vitals: BP 162/64  Pulse 81  Temp(Src) 98.7 F (37.1 C) (Oral)  Resp 20  Ht 5\' 9"  (1.753 m)  Wt 198 lb 3.1 oz (89.9 kg)  BMI 29.25 kg/m2  SpO2 93% General: Well developed, well appearing, in no acute distress. Neck: Supple. JVD not elevated. Lungs: Coarse breath sounds, R>L. No wheezes. Breathing is unlabored. Heart: RRR S1 S2 without murmurs, rubs, or gallops.  Abdomen: Soft, non-distended. Extremities: No clubbing or cyanosis. No edema.  Distal pedal pulses are 2+ and equal bilaterally. Neuro: Alert and oriented X 3. Moves all extremities spontaneously. No focal deficits. Skin: Left upper chest / implant site intact without hematoma.  Intake/Output:  Intake/Output Summary (Last 24 hours) at 08/15/13 0721 Last data filed at 08/15/13 0610  Gross per 24 hour  Intake    240 ml  Output    800 ml  Net   -560 ml    Inpatient Medications:  . amLODipine  2.5 mg Oral Daily  . atorvastatin  40 mg Oral q1800  . clonazePAM  0.5 mg Oral QHS  . clopidogrel  75 mg Oral Q breakfast  . guaiFENesin  600 mg Oral BID  . irbesartan  150 mg Oral Daily  . loratadine  10 mg Oral Daily  . pantoprazole  80 mg Oral Daily  . sodium chloride  3 mL Intravenous Q12H    Labs:  Recent Labs  08/15/13 0322  NA 128*  K 4.1  CL 92*  CO2 19  GLUCOSE 120*  BUN 16  CREATININE 0.97  CALCIUM 8.8    Recent Labs  08/12/13 1135 08/12/13 2032  TROPONINI <0.30 <0.30    Radiology/Studies: CXR this AM shows stable lead placement  Telemetry: SR Device interrogation: performed by industry this AM and reviewed by Dr Rayann Heman - normal PPM function   Assessment and Plan  1. Transient CHB s/p PPM implant 2. Normal LVEF 3. CAD Mr. Nidiffer is doing well post PPM implant. Wound intact without  hematoma. CXR shows stable lead placement. Will order PT per pts request. Stable for DC from EP standpoint. Reviewed DC instructions and follow-up.  Signed, EDMISTEN, BROOKE PA-C  I have seen, examined the patient, and reviewed the above assessment and plan.  Changes to above are made where necessary.  Doing well s/p PPM.  Routine wound care and follow-up  Co Sign: Thompson Grayer, MD 08/15/2013 8:33 AM

## 2013-08-15 NOTE — Evaluation (Signed)
Physical Therapy Evaluation Patient Details Name: XHAIDEN COOMBS MRN: [id number] DOB: 1925/06/11 Today's Date: 08/15/2013   History of Present Illness  80 yo male admitted with complete heart block. S/p pacemaker 3/31. Hx of HTN, CAD, TIA, laminectomy  Clinical Impression  On eval, pt required Min guard for ambulation and Min assist for stair negotiation-able to ambulate ~150 feet without assistive device. Unsteady at times but no LOB during ambulation. LOB x1 while ascending steps-Min assist to correct/prevent fall. Recommended for pt to stay on 1st level of home if at all possible for first few days. Otherwise, recommended him to limit stair negotiation as much as possible and for pt to have supervision/assist when climbing steps. Recommend HHPT to maximize independence and safety in home environment-made RN aware. Encouraged pt to discuss meds with RN before discharging-pt had question as to what he can take for back pain once home.     Follow Up Recommendations Home health PT;Supervision/Assistance - 24 hour    Equipment Recommendations   (has RW available at home if needed-belongs to wife)    Recommendations for Other Services       Precautions / Restrictions Precautions Precautions: Fall;ICD/Pacemaker Precaution Comments: Reviewed pacemaker precautions with pt. Pt was actually able to recall precautions and recommended use of sling Required Braces or Orthoses: Sling (L UE-s/p pacemaker 3/31)      Mobility  Bed Mobility               General bed mobility comments: not assessed-pt sitting up in recliner. Reviewed proper technique and limited use of L UE during bed mobility  Transfers Overall transfer level: Needs assistance   Transfers: Sit to/from Stand Sit to Stand: Min guard         General transfer comment: close guard for safety  Ambulation/Gait Ambulation/Gait assistance: Min guard Ambulation Distance (Feet): 150 Feet Assistive device: None Gait  Pattern/deviations: Step-through pattern;Decreased stride length     General Gait Details: slow gait speed. 2 brief standing rest breaks due to fatigue. Wheezing noted as well.   Stairs Stairs: Yes   Stair Management: One rail Right;Alternating pattern;Step to pattern;Forwards;Sideways Min assist Number of Stairs: 5 General stair comments: LOB  posteriorly x1 while ascending steps. VCS safety, technique. Encourged pt to use step to pattern for improved stability. Sideways while descending to allow for R UE support on handrail.   Wheelchair Mobility    Modified Rankin (Stroke Patients Only)       Balance Overall balance assessment: Needs assistance         Standing balance support: No upper extremity supported;During functional activity Standing balance-Leahy Scale: Fair                       Pertinent Vitals/Pain Pt denies shoulder/chest pain. However he does report low back pain as "tolerable".     Home Living Family/patient expects to be discharged to:: Private residence Living Arrangements: Spouse/significant other Available Help at Discharge: Family;Available 24 hours/day Type of Home: House Home Access: Stairs to enter   [employer] of Steps: 2 Home Layout: Two level;Bed/bath upstairs Home Equipment: None      Prior Function Level of Independence: Independent               Hand Dominance        Extremity/Trunk Assessment   Upper Extremity Assessment: LUE deficits/detail       LUE Deficits / Details: sling L UE   Lower Extremity Assessment: Generalized weakness  Cervical / Trunk Assessment: Normal  Communication   Communication: No difficulties  Cognition Arousal/Alertness: Awake/alert Behavior During Therapy: WFL for tasks assessed/performed Overall Cognitive Status: Within Functional Limits for tasks assessed                      General Comments      Exercises        Assessment/Plan    PT  Assessment All further PT needs can be met in the next venue of care  PT Diagnosis Difficulty walking;Generalized weakness   PT Problem List Decreased strength;Decreased activity tolerance;Decreased balance;Decreased mobility  PT Treatment Interventions     PT Goals (Current goals can be found in the Care Plan section) Acute Rehab PT Goals Patient Stated Goal: home today. resume daily walking.  PT Goal Formulation: No goals set, d/c therapy    Frequency     Barriers to discharge        End of Session   Activity Tolerance: Patient limited by fatigue Patient left: in chair;with call bell/phone within reach         Time: 0932-0954 PT Time Calculation (min): 22 min   Charges:   PT Evaluation $Initial PT Evaluation Tier I: 1 Procedure PT Treatments $Gait Training: 8-22 mins   PT G Codes:          Weston Anna, MPT Pager: [phone]

## 2013-08-17 DIAGNOSIS — Z950 Presence of cardiac pacemaker: Secondary | ICD-10-CM | POA: Diagnosis not present

## 2013-08-17 DIAGNOSIS — I442 Atrioventricular block, complete: Secondary | ICD-10-CM | POA: Diagnosis not present

## 2013-08-17 DIAGNOSIS — M5137 Other intervertebral disc degeneration, lumbosacral region: Secondary | ICD-10-CM | POA: Diagnosis not present

## 2013-08-17 DIAGNOSIS — K219 Gastro-esophageal reflux disease without esophagitis: Secondary | ICD-10-CM | POA: Diagnosis not present

## 2013-08-17 DIAGNOSIS — I119 Hypertensive heart disease without heart failure: Secondary | ICD-10-CM | POA: Diagnosis not present

## 2013-08-17 DIAGNOSIS — I2510 Atherosclerotic heart disease of native coronary artery without angina pectoris: Secondary | ICD-10-CM | POA: Diagnosis not present

## 2013-08-17 DIAGNOSIS — Z8673 Personal history of transient ischemic attack (TIA), and cerebral infarction without residual deficits: Secondary | ICD-10-CM | POA: Diagnosis not present

## 2013-08-17 DIAGNOSIS — G20 Parkinson's disease: Secondary | ICD-10-CM | POA: Diagnosis not present

## 2013-08-17 DIAGNOSIS — Z48812 Encounter for surgical aftercare following surgery on the circulatory system: Secondary | ICD-10-CM | POA: Diagnosis not present

## 2013-08-21 DIAGNOSIS — I2510 Atherosclerotic heart disease of native coronary artery without angina pectoris: Secondary | ICD-10-CM | POA: Diagnosis not present

## 2013-08-21 DIAGNOSIS — Z48812 Encounter for surgical aftercare following surgery on the circulatory system: Secondary | ICD-10-CM | POA: Diagnosis not present

## 2013-08-21 DIAGNOSIS — G20 Parkinson's disease: Secondary | ICD-10-CM | POA: Diagnosis not present

## 2013-08-21 DIAGNOSIS — M5137 Other intervertebral disc degeneration, lumbosacral region: Secondary | ICD-10-CM | POA: Diagnosis not present

## 2013-08-21 DIAGNOSIS — I119 Hypertensive heart disease without heart failure: Secondary | ICD-10-CM | POA: Diagnosis not present

## 2013-08-21 DIAGNOSIS — I442 Atrioventricular block, complete: Secondary | ICD-10-CM | POA: Diagnosis not present

## 2013-08-22 DIAGNOSIS — I2510 Atherosclerotic heart disease of native coronary artery without angina pectoris: Secondary | ICD-10-CM | POA: Diagnosis not present

## 2013-08-22 DIAGNOSIS — G20 Parkinson's disease: Secondary | ICD-10-CM | POA: Diagnosis not present

## 2013-08-22 DIAGNOSIS — M5137 Other intervertebral disc degeneration, lumbosacral region: Secondary | ICD-10-CM | POA: Diagnosis not present

## 2013-08-22 DIAGNOSIS — I442 Atrioventricular block, complete: Secondary | ICD-10-CM | POA: Diagnosis not present

## 2013-08-22 DIAGNOSIS — Z48812 Encounter for surgical aftercare following surgery on the circulatory system: Secondary | ICD-10-CM | POA: Diagnosis not present

## 2013-08-22 DIAGNOSIS — I119 Hypertensive heart disease without heart failure: Secondary | ICD-10-CM | POA: Diagnosis not present

## 2013-08-23 ENCOUNTER — Encounter: Payer: Self-pay | Admitting: Internal Medicine

## 2013-08-23 ENCOUNTER — Ambulatory Visit (INDEPENDENT_AMBULATORY_CARE_PROVIDER_SITE_OTHER): Payer: Medicare Other | Admitting: *Deleted

## 2013-08-23 DIAGNOSIS — I495 Sick sinus syndrome: Secondary | ICD-10-CM | POA: Diagnosis not present

## 2013-08-23 DIAGNOSIS — I442 Atrioventricular block, complete: Secondary | ICD-10-CM | POA: Diagnosis not present

## 2013-08-23 DIAGNOSIS — I119 Hypertensive heart disease without heart failure: Secondary | ICD-10-CM | POA: Diagnosis not present

## 2013-08-23 DIAGNOSIS — M5137 Other intervertebral disc degeneration, lumbosacral region: Secondary | ICD-10-CM | POA: Diagnosis not present

## 2013-08-23 DIAGNOSIS — I2510 Atherosclerotic heart disease of native coronary artery without angina pectoris: Secondary | ICD-10-CM | POA: Diagnosis not present

## 2013-08-23 DIAGNOSIS — Z48812 Encounter for surgical aftercare following surgery on the circulatory system: Secondary | ICD-10-CM | POA: Diagnosis not present

## 2013-08-23 DIAGNOSIS — G20 Parkinson's disease: Secondary | ICD-10-CM | POA: Diagnosis not present

## 2013-08-23 LAB — MDC_IDC_ENUM_SESS_TYPE_INCLINIC
Battery Impedance: 100 Ohm
Battery Voltage: 2.81 V
Brady Statistic AP VP Percent: 0.9 %
Brady Statistic AP VS Percent: 13.1 %
Brady Statistic AS VS Percent: 85.9 %
Lead Channel Impedance Value: 633 Ohm
Lead Channel Pacing Threshold Amplitude: 0.5 V
Lead Channel Pacing Threshold Amplitude: 0.75 V
Lead Channel Pacing Threshold Pulse Width: 0.4 ms
Lead Channel Sensing Intrinsic Amplitude: 2.8 mV
Lead Channel Setting Pacing Amplitude: 3.5 V
Lead Channel Setting Pacing Amplitude: 3.5 V
Lead Channel Setting Pacing Pulse Width: 0.4 ms
MDC IDC MSMT LEADCHNL RV IMPEDANCE VALUE: 661 Ohm
MDC IDC MSMT LEADCHNL RV PACING THRESHOLD PULSEWIDTH: 0.4 ms
MDC IDC MSMT LEADCHNL RV SENSING INTR AMPL: 4 mV
MDC IDC SET LEADCHNL RV SENSING SENSITIVITY: 2 mV
MDC IDC STAT BRADY AS VP PERCENT: 0.1 %

## 2013-08-24 ENCOUNTER — Telehealth: Payer: Self-pay | Admitting: Internal Medicine

## 2013-08-24 DIAGNOSIS — M5137 Other intervertebral disc degeneration, lumbosacral region: Secondary | ICD-10-CM | POA: Diagnosis not present

## 2013-08-24 DIAGNOSIS — I2510 Atherosclerotic heart disease of native coronary artery without angina pectoris: Secondary | ICD-10-CM | POA: Diagnosis not present

## 2013-08-24 DIAGNOSIS — Z48812 Encounter for surgical aftercare following surgery on the circulatory system: Secondary | ICD-10-CM | POA: Diagnosis not present

## 2013-08-24 DIAGNOSIS — G20 Parkinson's disease: Secondary | ICD-10-CM | POA: Diagnosis not present

## 2013-08-24 DIAGNOSIS — I119 Hypertensive heart disease without heart failure: Secondary | ICD-10-CM | POA: Diagnosis not present

## 2013-08-24 DIAGNOSIS — I442 Atrioventricular block, complete: Secondary | ICD-10-CM | POA: Diagnosis not present

## 2013-08-24 NOTE — Telephone Encounter (Signed)
New message     Home health needs to talk to a nurse urgently

## 2013-08-24 NOTE — Telephone Encounter (Signed)
Pts home health nurse for [place] Allyson Sabal) called and stated that when she was assessing Pt at her [facility] visit today she heard a murmur. Gerald Stabs wanted to know if this was new onset or does Pt have a hx of a murmur. Went back into chart review and looked at Pt hx and no murmur noted. However, I did tell Gerald Stabs that pt does have a hx of a patent foramen ovale noted on his chart. Advised HHN Chris to contact Pts primary cardiologist Dr. Wynonia Lawman for further information.

## 2013-08-27 DIAGNOSIS — I119 Hypertensive heart disease without heart failure: Secondary | ICD-10-CM | POA: Diagnosis not present

## 2013-08-27 DIAGNOSIS — R209 Unspecified disturbances of skin sensation: Secondary | ICD-10-CM | POA: Diagnosis not present

## 2013-08-27 DIAGNOSIS — I442 Atrioventricular block, complete: Secondary | ICD-10-CM | POA: Diagnosis not present

## 2013-08-27 DIAGNOSIS — I2510 Atherosclerotic heart disease of native coronary artery without angina pectoris: Secondary | ICD-10-CM | POA: Diagnosis not present

## 2013-08-27 DIAGNOSIS — G20 Parkinson's disease: Secondary | ICD-10-CM | POA: Diagnosis not present

## 2013-08-27 DIAGNOSIS — I10 Essential (primary) hypertension: Secondary | ICD-10-CM | POA: Diagnosis not present

## 2013-08-27 DIAGNOSIS — E871 Hypo-osmolality and hyponatremia: Secondary | ICD-10-CM | POA: Diagnosis not present

## 2013-08-27 DIAGNOSIS — Z48812 Encounter for surgical aftercare following surgery on the circulatory system: Secondary | ICD-10-CM | POA: Diagnosis not present

## 2013-08-27 DIAGNOSIS — M5137 Other intervertebral disc degeneration, lumbosacral region: Secondary | ICD-10-CM | POA: Diagnosis not present

## 2013-08-27 NOTE — Progress Notes (Signed)
Wound check appointment. Steri-strips removed. Wound without redness or edema. Incision edges approximated, wound well healed. Normal device function. Thresholds, sensing, and impedances consistent with implant measurements. Device programmed at 3.5V for extra safety margin until 3 month visit. Histogram distribution appropriate for patient and level of activity. No mode switches or high ventricular rates noted. Patient educated about wound care, arm mobility, lifting restrictions. ROV in 3 months with JA. 

## 2013-08-28 DIAGNOSIS — I442 Atrioventricular block, complete: Secondary | ICD-10-CM | POA: Diagnosis not present

## 2013-08-28 DIAGNOSIS — G20 Parkinson's disease: Secondary | ICD-10-CM | POA: Diagnosis not present

## 2013-08-28 DIAGNOSIS — I2510 Atherosclerotic heart disease of native coronary artery without angina pectoris: Secondary | ICD-10-CM | POA: Diagnosis not present

## 2013-08-28 DIAGNOSIS — M5137 Other intervertebral disc degeneration, lumbosacral region: Secondary | ICD-10-CM | POA: Diagnosis not present

## 2013-08-28 DIAGNOSIS — I119 Hypertensive heart disease without heart failure: Secondary | ICD-10-CM | POA: Diagnosis not present

## 2013-08-28 DIAGNOSIS — Z48812 Encounter for surgical aftercare following surgery on the circulatory system: Secondary | ICD-10-CM | POA: Diagnosis not present

## 2013-08-29 DIAGNOSIS — Z79899 Other long term (current) drug therapy: Secondary | ICD-10-CM | POA: Diagnosis not present

## 2013-08-29 DIAGNOSIS — I2510 Atherosclerotic heart disease of native coronary artery without angina pectoris: Secondary | ICD-10-CM | POA: Diagnosis not present

## 2013-08-29 DIAGNOSIS — I209 Angina pectoris, unspecified: Secondary | ICD-10-CM | POA: Diagnosis not present

## 2013-08-29 DIAGNOSIS — IMO0001 Reserved for inherently not codable concepts without codable children: Secondary | ICD-10-CM | POA: Diagnosis not present

## 2013-08-29 DIAGNOSIS — Z951 Presence of aortocoronary bypass graft: Secondary | ICD-10-CM | POA: Diagnosis not present

## 2013-08-29 DIAGNOSIS — I951 Orthostatic hypotension: Secondary | ICD-10-CM | POA: Diagnosis not present

## 2013-08-29 DIAGNOSIS — I119 Hypertensive heart disease without heart failure: Secondary | ICD-10-CM | POA: Diagnosis not present

## 2013-08-29 DIAGNOSIS — E785 Hyperlipidemia, unspecified: Secondary | ICD-10-CM | POA: Diagnosis not present

## 2013-08-30 DIAGNOSIS — M5137 Other intervertebral disc degeneration, lumbosacral region: Secondary | ICD-10-CM | POA: Diagnosis not present

## 2013-08-30 DIAGNOSIS — I2510 Atherosclerotic heart disease of native coronary artery without angina pectoris: Secondary | ICD-10-CM | POA: Diagnosis not present

## 2013-08-30 DIAGNOSIS — I442 Atrioventricular block, complete: Secondary | ICD-10-CM | POA: Diagnosis not present

## 2013-08-30 DIAGNOSIS — G20 Parkinson's disease: Secondary | ICD-10-CM | POA: Diagnosis not present

## 2013-08-30 DIAGNOSIS — I119 Hypertensive heart disease without heart failure: Secondary | ICD-10-CM | POA: Diagnosis not present

## 2013-08-30 DIAGNOSIS — Z48812 Encounter for surgical aftercare following surgery on the circulatory system: Secondary | ICD-10-CM | POA: Diagnosis not present

## 2013-08-31 DIAGNOSIS — I2510 Atherosclerotic heart disease of native coronary artery without angina pectoris: Secondary | ICD-10-CM | POA: Diagnosis not present

## 2013-08-31 DIAGNOSIS — I119 Hypertensive heart disease without heart failure: Secondary | ICD-10-CM | POA: Diagnosis not present

## 2013-08-31 DIAGNOSIS — I442 Atrioventricular block, complete: Secondary | ICD-10-CM | POA: Diagnosis not present

## 2013-08-31 DIAGNOSIS — G20 Parkinson's disease: Secondary | ICD-10-CM | POA: Diagnosis not present

## 2013-08-31 DIAGNOSIS — M5137 Other intervertebral disc degeneration, lumbosacral region: Secondary | ICD-10-CM | POA: Diagnosis not present

## 2013-08-31 DIAGNOSIS — Z48812 Encounter for surgical aftercare following surgery on the circulatory system: Secondary | ICD-10-CM | POA: Diagnosis not present

## 2013-09-03 DIAGNOSIS — I119 Hypertensive heart disease without heart failure: Secondary | ICD-10-CM | POA: Diagnosis not present

## 2013-09-03 DIAGNOSIS — G20 Parkinson's disease: Secondary | ICD-10-CM | POA: Diagnosis not present

## 2013-09-03 DIAGNOSIS — M5137 Other intervertebral disc degeneration, lumbosacral region: Secondary | ICD-10-CM | POA: Diagnosis not present

## 2013-09-03 DIAGNOSIS — I442 Atrioventricular block, complete: Secondary | ICD-10-CM | POA: Diagnosis not present

## 2013-09-03 DIAGNOSIS — Z48812 Encounter for surgical aftercare following surgery on the circulatory system: Secondary | ICD-10-CM | POA: Diagnosis not present

## 2013-09-03 DIAGNOSIS — I2510 Atherosclerotic heart disease of native coronary artery without angina pectoris: Secondary | ICD-10-CM | POA: Diagnosis not present

## 2013-09-06 DIAGNOSIS — G20 Parkinson's disease: Secondary | ICD-10-CM | POA: Diagnosis not present

## 2013-09-06 DIAGNOSIS — I2510 Atherosclerotic heart disease of native coronary artery without angina pectoris: Secondary | ICD-10-CM | POA: Diagnosis not present

## 2013-09-06 DIAGNOSIS — Z48812 Encounter for surgical aftercare following surgery on the circulatory system: Secondary | ICD-10-CM | POA: Diagnosis not present

## 2013-09-06 DIAGNOSIS — I442 Atrioventricular block, complete: Secondary | ICD-10-CM | POA: Diagnosis not present

## 2013-09-06 DIAGNOSIS — M5137 Other intervertebral disc degeneration, lumbosacral region: Secondary | ICD-10-CM | POA: Diagnosis not present

## 2013-09-06 DIAGNOSIS — I119 Hypertensive heart disease without heart failure: Secondary | ICD-10-CM | POA: Diagnosis not present

## 2013-09-11 DIAGNOSIS — G20 Parkinson's disease: Secondary | ICD-10-CM | POA: Diagnosis not present

## 2013-09-11 DIAGNOSIS — M5137 Other intervertebral disc degeneration, lumbosacral region: Secondary | ICD-10-CM | POA: Diagnosis not present

## 2013-09-11 DIAGNOSIS — Z48812 Encounter for surgical aftercare following surgery on the circulatory system: Secondary | ICD-10-CM | POA: Diagnosis not present

## 2013-09-11 DIAGNOSIS — I119 Hypertensive heart disease without heart failure: Secondary | ICD-10-CM | POA: Diagnosis not present

## 2013-09-11 DIAGNOSIS — I2510 Atherosclerotic heart disease of native coronary artery without angina pectoris: Secondary | ICD-10-CM | POA: Diagnosis not present

## 2013-09-11 DIAGNOSIS — I442 Atrioventricular block, complete: Secondary | ICD-10-CM | POA: Diagnosis not present

## 2013-09-26 DIAGNOSIS — K219 Gastro-esophageal reflux disease without esophagitis: Secondary | ICD-10-CM | POA: Diagnosis not present

## 2013-09-26 DIAGNOSIS — R05 Cough: Secondary | ICD-10-CM | POA: Diagnosis not present

## 2013-09-26 DIAGNOSIS — R1310 Dysphagia, unspecified: Secondary | ICD-10-CM | POA: Diagnosis not present

## 2013-09-26 DIAGNOSIS — R059 Cough, unspecified: Secondary | ICD-10-CM | POA: Diagnosis not present

## 2013-10-02 ENCOUNTER — Telehealth: Payer: Self-pay | Admitting: Internal Medicine

## 2013-10-02 NOTE — Telephone Encounter (Signed)
I have looked at the schedule with Clinton Hughes and we will work him in tomorrow at [employer]  He has swelling in left hand and into arm near watch.  Has been going on for one week  Swells at night ant subsides during the day.  He called Dr Thurman Coyer office and ws told to call here to be seen

## 2013-10-02 NOTE — Telephone Encounter (Signed)
New message    Pt had pacemaker put in march 28th---now pt has swelling at device site and in hand.  Dr Wynonia Lawman told him to call here.  Please call before you leave today

## 2013-10-03 ENCOUNTER — Ambulatory Visit (INDEPENDENT_AMBULATORY_CARE_PROVIDER_SITE_OTHER): Payer: Medicare Other | Admitting: *Deleted

## 2013-10-03 ENCOUNTER — Encounter: Payer: Self-pay | Admitting: Internal Medicine

## 2013-10-03 DIAGNOSIS — Z950 Presence of cardiac pacemaker: Secondary | ICD-10-CM

## 2013-10-03 LAB — MDC_IDC_ENUM_SESS_TYPE_INCLINIC
Battery Remaining Longevity: 159 mo
Battery Voltage: 2.81 V
Brady Statistic AP VP Percent: 2 %
Date Time Interrogation Session: 20150520132034
Lead Channel Impedance Value: 574 Ohm
Lead Channel Impedance Value: 609 Ohm
Lead Channel Pacing Threshold Pulse Width: 0.4 ms
Lead Channel Setting Pacing Amplitude: 3.5 V
Lead Channel Setting Pacing Amplitude: 3.5 V
Lead Channel Setting Sensing Sensitivity: 2 mV
MDC IDC MSMT BATTERY IMPEDANCE: 100 Ohm
MDC IDC MSMT LEADCHNL RA PACING THRESHOLD AMPLITUDE: 0.5 V
MDC IDC MSMT LEADCHNL RA SENSING INTR AMPL: 4 mV
MDC IDC MSMT LEADCHNL RV SENSING INTR AMPL: 2.8 mV
MDC IDC SET LEADCHNL RV PACING PULSEWIDTH: 0.4 ms
MDC IDC STAT BRADY AP VS PERCENT: 23 %
MDC IDC STAT BRADY AS VP PERCENT: 0 %
MDC IDC STAT BRADY AS VS PERCENT: 74 %

## 2013-10-03 NOTE — Progress Notes (Signed)
Pt c/o swelling at pocket site, left collar bone, & left arm. Swelling has been dissipating per pt. Swelling at pocket and around neck is bilateral with left side slightly larger, okay per JA. Normal device function. Thresholds, sensing, impedances consistent with previous measurements. Device programmed to maximize longevity. 1 mode switch--- <0.1%. No high ventricular rates noted. Device programmed at appropriate safety margins---leads still at extra safety margin settings until 67mo check. Histogram distribution appropriate for patient activity level. Device programmed to optimize intrinsic conduction. Estimated longevity 13 yrs. ROV w/ Dr. Rayann Heman 12/12/13.

## 2013-10-09 ENCOUNTER — Encounter: Payer: Self-pay | Admitting: Cardiology

## 2013-10-15 DIAGNOSIS — Z951 Presence of aortocoronary bypass graft: Secondary | ICD-10-CM | POA: Diagnosis not present

## 2013-10-15 DIAGNOSIS — Z79899 Other long term (current) drug therapy: Secondary | ICD-10-CM | POA: Diagnosis not present

## 2013-10-15 DIAGNOSIS — I2510 Atherosclerotic heart disease of native coronary artery without angina pectoris: Secondary | ICD-10-CM | POA: Diagnosis not present

## 2013-10-15 DIAGNOSIS — I119 Hypertensive heart disease without heart failure: Secondary | ICD-10-CM | POA: Diagnosis not present

## 2013-10-15 DIAGNOSIS — I209 Angina pectoris, unspecified: Secondary | ICD-10-CM | POA: Diagnosis not present

## 2013-10-15 DIAGNOSIS — I951 Orthostatic hypotension: Secondary | ICD-10-CM | POA: Diagnosis not present

## 2013-10-15 DIAGNOSIS — E785 Hyperlipidemia, unspecified: Secondary | ICD-10-CM | POA: Diagnosis not present

## 2013-10-15 DIAGNOSIS — IMO0001 Reserved for inherently not codable concepts without codable children: Secondary | ICD-10-CM | POA: Diagnosis not present

## 2013-11-05 DIAGNOSIS — J309 Allergic rhinitis, unspecified: Secondary | ICD-10-CM | POA: Diagnosis not present

## 2013-11-05 DIAGNOSIS — I517 Cardiomegaly: Secondary | ICD-10-CM | POA: Diagnosis not present

## 2013-11-05 DIAGNOSIS — I10 Essential (primary) hypertension: Secondary | ICD-10-CM | POA: Diagnosis not present

## 2013-11-05 DIAGNOSIS — R0989 Other specified symptoms and signs involving the circulatory and respiratory systems: Secondary | ICD-10-CM | POA: Diagnosis not present

## 2013-11-05 DIAGNOSIS — R062 Wheezing: Secondary | ICD-10-CM | POA: Diagnosis not present

## 2013-11-05 DIAGNOSIS — Z950 Presence of cardiac pacemaker: Secondary | ICD-10-CM | POA: Diagnosis not present

## 2013-11-07 DIAGNOSIS — E871 Hypo-osmolality and hyponatremia: Secondary | ICD-10-CM | POA: Diagnosis not present

## 2013-11-07 DIAGNOSIS — I959 Hypotension, unspecified: Secondary | ICD-10-CM | POA: Diagnosis not present

## 2013-11-07 DIAGNOSIS — R0989 Other specified symptoms and signs involving the circulatory and respiratory systems: Secondary | ICD-10-CM | POA: Diagnosis not present

## 2013-11-07 DIAGNOSIS — R062 Wheezing: Secondary | ICD-10-CM | POA: Diagnosis not present

## 2013-11-07 DIAGNOSIS — I517 Cardiomegaly: Secondary | ICD-10-CM | POA: Diagnosis not present

## 2013-11-07 DIAGNOSIS — I10 Essential (primary) hypertension: Secondary | ICD-10-CM | POA: Diagnosis not present

## 2013-11-07 DIAGNOSIS — R6889 Other general symptoms and signs: Secondary | ICD-10-CM | POA: Diagnosis not present

## 2013-11-07 DIAGNOSIS — Z950 Presence of cardiac pacemaker: Secondary | ICD-10-CM | POA: Diagnosis not present

## 2013-11-09 DIAGNOSIS — E785 Hyperlipidemia, unspecified: Secondary | ICD-10-CM | POA: Diagnosis not present

## 2013-11-09 DIAGNOSIS — Z79899 Other long term (current) drug therapy: Secondary | ICD-10-CM | POA: Diagnosis not present

## 2013-11-09 DIAGNOSIS — I209 Angina pectoris, unspecified: Secondary | ICD-10-CM | POA: Diagnosis not present

## 2013-11-09 DIAGNOSIS — Z8673 Personal history of transient ischemic attack (TIA), and cerebral infarction without residual deficits: Secondary | ICD-10-CM | POA: Diagnosis not present

## 2013-11-09 DIAGNOSIS — Z951 Presence of aortocoronary bypass graft: Secondary | ICD-10-CM | POA: Diagnosis not present

## 2013-11-09 DIAGNOSIS — I119 Hypertensive heart disease without heart failure: Secondary | ICD-10-CM | POA: Diagnosis not present

## 2013-11-09 DIAGNOSIS — I2510 Atherosclerotic heart disease of native coronary artery without angina pectoris: Secondary | ICD-10-CM | POA: Diagnosis not present

## 2013-11-09 DIAGNOSIS — R0602 Shortness of breath: Secondary | ICD-10-CM | POA: Diagnosis not present

## 2013-11-12 ENCOUNTER — Other Ambulatory Visit: Payer: Self-pay | Admitting: Cardiology

## 2013-11-12 ENCOUNTER — Other Ambulatory Visit (HOSPITAL_COMMUNITY): Payer: Self-pay | Admitting: Cardiology

## 2013-11-12 DIAGNOSIS — M7989 Other specified soft tissue disorders: Secondary | ICD-10-CM

## 2013-11-12 DIAGNOSIS — R0600 Dyspnea, unspecified: Secondary | ICD-10-CM

## 2013-11-12 DIAGNOSIS — R609 Edema, unspecified: Secondary | ICD-10-CM

## 2013-11-12 DIAGNOSIS — R0989 Other specified symptoms and signs involving the circulatory and respiratory systems: Principal | ICD-10-CM

## 2013-11-12 DIAGNOSIS — R0609 Other forms of dyspnea: Secondary | ICD-10-CM

## 2013-11-14 DIAGNOSIS — I2510 Atherosclerotic heart disease of native coronary artery without angina pectoris: Secondary | ICD-10-CM | POA: Diagnosis not present

## 2013-11-15 ENCOUNTER — Ambulatory Visit
Admission: RE | Admit: 2013-11-15 | Discharge: 2013-11-15 | Disposition: A | Discharge status: Home / Self Care | Payer: Medicare Other | Source: Ambulatory Visit | Attending: Cardiology | Admitting: Cardiology

## 2013-11-15 DIAGNOSIS — R0609 Other forms of dyspnea: Secondary | ICD-10-CM

## 2013-11-15 DIAGNOSIS — R0989 Other specified symptoms and signs involving the circulatory and respiratory systems: Principal | ICD-10-CM

## 2013-11-15 DIAGNOSIS — Z45018 Encounter for adjustment and management of other part of cardiac pacemaker: Secondary | ICD-10-CM | POA: Diagnosis not present

## 2013-11-15 DIAGNOSIS — J984 Other disorders of lung: Secondary | ICD-10-CM | POA: Diagnosis not present

## 2013-11-16 ENCOUNTER — Ambulatory Visit (HOSPITAL_COMMUNITY): Payer: Medicare Other

## 2013-11-19 DIAGNOSIS — E871 Hypo-osmolality and hyponatremia: Secondary | ICD-10-CM | POA: Diagnosis not present

## 2013-11-19 DIAGNOSIS — I10 Essential (primary) hypertension: Secondary | ICD-10-CM | POA: Diagnosis not present

## 2013-11-19 DIAGNOSIS — Z950 Presence of cardiac pacemaker: Secondary | ICD-10-CM | POA: Diagnosis not present

## 2013-11-19 DIAGNOSIS — I517 Cardiomegaly: Secondary | ICD-10-CM | POA: Diagnosis not present

## 2013-11-19 DIAGNOSIS — R062 Wheezing: Secondary | ICD-10-CM | POA: Diagnosis not present

## 2013-11-19 DIAGNOSIS — R6889 Other general symptoms and signs: Secondary | ICD-10-CM | POA: Diagnosis not present

## 2013-11-21 DIAGNOSIS — I2510 Atherosclerotic heart disease of native coronary artery without angina pectoris: Secondary | ICD-10-CM | POA: Diagnosis not present

## 2013-11-21 DIAGNOSIS — E871 Hypo-osmolality and hyponatremia: Secondary | ICD-10-CM | POA: Diagnosis not present

## 2013-11-21 DIAGNOSIS — I10 Essential (primary) hypertension: Secondary | ICD-10-CM | POA: Diagnosis not present

## 2013-11-26 DIAGNOSIS — E785 Hyperlipidemia, unspecified: Secondary | ICD-10-CM | POA: Diagnosis not present

## 2013-11-26 DIAGNOSIS — I119 Hypertensive heart disease without heart failure: Secondary | ICD-10-CM | POA: Diagnosis not present

## 2013-11-26 DIAGNOSIS — IMO0001 Reserved for inherently not codable concepts without codable children: Secondary | ICD-10-CM | POA: Diagnosis not present

## 2013-11-26 DIAGNOSIS — Z79899 Other long term (current) drug therapy: Secondary | ICD-10-CM | POA: Diagnosis not present

## 2013-11-26 DIAGNOSIS — Z951 Presence of aortocoronary bypass graft: Secondary | ICD-10-CM | POA: Diagnosis not present

## 2013-11-26 DIAGNOSIS — Z8673 Personal history of transient ischemic attack (TIA), and cerebral infarction without residual deficits: Secondary | ICD-10-CM | POA: Diagnosis not present

## 2013-11-26 DIAGNOSIS — I209 Angina pectoris, unspecified: Secondary | ICD-10-CM | POA: Diagnosis not present

## 2013-11-26 DIAGNOSIS — I2510 Atherosclerotic heart disease of native coronary artery without angina pectoris: Secondary | ICD-10-CM | POA: Diagnosis not present

## 2013-12-12 ENCOUNTER — Ambulatory Visit (INDEPENDENT_AMBULATORY_CARE_PROVIDER_SITE_OTHER): Payer: Medicare Other | Admitting: Internal Medicine

## 2013-12-12 ENCOUNTER — Encounter: Payer: Self-pay | Admitting: Internal Medicine

## 2013-12-12 VITALS — BP 180/81 | HR 77 | Ht 69.0 in | Wt 197.0 lb

## 2013-12-12 DIAGNOSIS — I495 Sick sinus syndrome: Secondary | ICD-10-CM

## 2013-12-12 DIAGNOSIS — I2510 Atherosclerotic heart disease of native coronary artery without angina pectoris: Secondary | ICD-10-CM

## 2013-12-12 DIAGNOSIS — I119 Hypertensive heart disease without heart failure: Secondary | ICD-10-CM | POA: Diagnosis not present

## 2013-12-12 DIAGNOSIS — Z950 Presence of cardiac pacemaker: Secondary | ICD-10-CM

## 2013-12-12 DIAGNOSIS — I442 Atrioventricular block, complete: Secondary | ICD-10-CM | POA: Diagnosis not present

## 2013-12-12 LAB — MDC_IDC_ENUM_SESS_TYPE_INCLINIC
Brady Statistic AP VS Percent: 9 %
Brady Statistic AS VP Percent: 48 %
Brady Statistic AS VS Percent: 17 %
Date Time Interrogation Session: 20150729155231
Lead Channel Impedance Value: 660 Ohm
Lead Channel Pacing Threshold Amplitude: 0.5 V
Lead Channel Pacing Threshold Amplitude: 0.75 V
Lead Channel Pacing Threshold Pulse Width: 0.4 ms
Lead Channel Sensing Intrinsic Amplitude: 2.8 mV
MDC IDC MSMT BATTERY IMPEDANCE: 100 Ohm
MDC IDC MSMT BATTERY REMAINING LONGEVITY: 164 mo
MDC IDC MSMT BATTERY VOLTAGE: 2.81 V
MDC IDC MSMT LEADCHNL RA IMPEDANCE VALUE: 546 Ohm
MDC IDC MSMT LEADCHNL RA PACING THRESHOLD PULSEWIDTH: 0.4 ms
MDC IDC SET LEADCHNL RA PACING AMPLITUDE: 2 V
MDC IDC SET LEADCHNL RV PACING AMPLITUDE: 2.5 V
MDC IDC SET LEADCHNL RV PACING PULSEWIDTH: 0.4 ms
MDC IDC SET LEADCHNL RV SENSING SENSITIVITY: 2 mV
MDC IDC STAT BRADY AP VP PERCENT: 26 %

## 2013-12-12 NOTE — Patient Instructions (Signed)
Your physician recommends that you schedule a follow-up appointment as needed with Dr Rayann Heman and as scheduled with Dr Wynonia Lawman

## 2013-12-17 ENCOUNTER — Encounter: Payer: Self-pay | Admitting: Internal Medicine

## 2013-12-17 DIAGNOSIS — I442 Atrioventricular block, complete: Secondary | ICD-10-CM | POA: Insufficient documentation

## 2013-12-17 DIAGNOSIS — I495 Sick sinus syndrome: Secondary | ICD-10-CM | POA: Insufficient documentation

## 2013-12-17 NOTE — Progress Notes (Signed)
PCP:  Melinda Crutch, MD Primary Cardiologist:  Clinton Hughes is a 80 y.o. male who presents today for routine electrophysiology followup.  Since his pacemaker implant, the patient reports doing very well.  Today, he denies symptoms of palpitations, chest pain, shortness of breath,  lower extremity edema, dizziness, presyncope, or syncope.  The patient is otherwise without complaint today.   Past Medical History  Diagnosis Date  . Coronary artery disease   . Movement disorder   . H/O hiatal hernia   . High cholesterol   . Pneumonia 2014 X1  . Pneumonia and influenza 2013 X2  . GERD (gastroesophageal reflux disease)   . TIA (transient ischemic attack) 03/02/2013    Spelled of speech arrest during hospitalization in October of this year. Clinton. that he was consult and in the hospital. Clinton. Wynonia Hughes just applied a cardiac monitor and performed an echocardiogram. Final approval of proximal stenosis has been seen. Patient here today to followup after hospitalization and more presumed TIAs with speech arrest numbness of her hands lips right side of the face.4  events i  . Lumbar disc disease   . PFO (patent foramen ovale)   . Hypertensive heart disease    Past Surgical History  Procedure Laterality Date  . Colonoscopy  2006/2008  . Lumbar laminectomy  1980    "L5?" (02/14/2013)  . Cataract extraction w/ intraocular lens  implant, bilateral Bilateral 2007-2010  . Pacemaker insertion  08/14/13    MDT Adapta L implanted by Clinton Clinton Hughes for complete heart blcok  . Back surgery    . Coronary artery bypass graft  1995    "CABG X5" (02/14/2013)  . Refractive surgery Right ~ 02/07/2013    "to clear a couple clouds off" (02/14/2013)  . Tee without cardioversion N/A 03/08/2013    Procedure: TRANSESOPHAGEAL ECHOCARDIOGRAM (TEE);  Surgeon: Fay Records, MD;  Location: [facility] ENDOSCOPY;  Service: Cardiovascular;  Laterality: N/A;    ROS- all systems are reviewed and negative except as per HPI  above  Current Outpatient Prescriptions  Medication Sig Dispense Refill  . clopidogrel (PLAVIX) 75 MG tablet Take 1 tablet (75 mg total) by mouth daily.  90 tablet  3  . fluticasone (FLONASE) 50 MCG/ACT nasal spray Place 2 sprays into both nostrils as needed.      . metoprolol (LOPRESSOR) 50 MG tablet Take 50 mg by mouth 2 (two) times daily.      . montelukast (SINGULAIR) 10 MG tablet Take 1 tablet by mouth every evening.      Marland Kitchen omeprazole (PRILOSEC) 40 MG capsule Take 40 mg by mouth daily.      . rosuvastatin (CRESTOR) 20 MG tablet Take 20 mg by mouth daily.      . valsartan (DIOVAN) 160 MG tablet Take 160 mg by mouth daily.       No current facility-administered medications for this visit.    Physical Exam: Filed Vitals:   12/12/13 1146  BP: 180/81  Pulse: 77  Height: 5\' 9"  (1.753 m)  Weight: 197 lb (89.359 kg)    GEN- The patient is elderly appearing, alert and oriented x 3 today.   Head- normocephalic, atraumatic Eyes-  Sclera clear, conjunctiva pink Ears- hearing intact Oropharynx- clear Lungs- Clear to ausculation bilaterally, normal work of breathing Chest- pacemaker pocket is well healed Heart- Regular rate and rhythm, no murmurs, rubs or gallops, PMI not laterally displaced GI- soft, NT, ND, + BS Extremities- no clubbing, cyanosis, or edema  Pacemaker  interrogation- reviewed in detail today,  See PACEART report ekg today reveals AV sequential pacing, pvcs  Assessment and Plan:  1. Complete heart block/ sick sinus Normal pacemaker function See Pace Art report No changes today  2. Hypertensive cardiovascular disease Elevated BP today though he reports good blood pressure control at home No changes are made He will follow-up with Clinton Clinton Hughes  3. CAD Stable No change required today  He will follow-up with Clinton Clinton Hughes going forward and return to see me as needed

## 2013-12-19 DIAGNOSIS — E871 Hypo-osmolality and hyponatremia: Secondary | ICD-10-CM | POA: Diagnosis not present

## 2013-12-24 DIAGNOSIS — E871 Hypo-osmolality and hyponatremia: Secondary | ICD-10-CM | POA: Diagnosis not present

## 2013-12-24 DIAGNOSIS — E875 Hyperkalemia: Secondary | ICD-10-CM | POA: Diagnosis not present

## 2013-12-24 DIAGNOSIS — I10 Essential (primary) hypertension: Secondary | ICD-10-CM | POA: Diagnosis not present

## 2014-01-08 ENCOUNTER — Encounter: Payer: Self-pay | Admitting: Internal Medicine

## 2014-01-16 ENCOUNTER — Ambulatory Visit (INDEPENDENT_AMBULATORY_CARE_PROVIDER_SITE_OTHER): Payer: Medicare Other | Admitting: Ophthalmology

## 2014-01-16 DIAGNOSIS — H35039 Hypertensive retinopathy, unspecified eye: Secondary | ICD-10-CM | POA: Diagnosis not present

## 2014-01-16 DIAGNOSIS — I10 Essential (primary) hypertension: Secondary | ICD-10-CM | POA: Diagnosis not present

## 2014-01-16 DIAGNOSIS — H35059 Retinal neovascularization, unspecified, unspecified eye: Secondary | ICD-10-CM | POA: Diagnosis not present

## 2014-01-16 DIAGNOSIS — H35379 Puckering of macula, unspecified eye: Secondary | ICD-10-CM | POA: Diagnosis not present

## 2014-01-16 DIAGNOSIS — H43819 Vitreous degeneration, unspecified eye: Secondary | ICD-10-CM

## 2014-01-18 DIAGNOSIS — R062 Wheezing: Secondary | ICD-10-CM | POA: Diagnosis not present

## 2014-01-18 DIAGNOSIS — Z950 Presence of cardiac pacemaker: Secondary | ICD-10-CM | POA: Diagnosis not present

## 2014-01-18 DIAGNOSIS — I10 Essential (primary) hypertension: Secondary | ICD-10-CM | POA: Diagnosis not present

## 2014-01-18 DIAGNOSIS — I517 Cardiomegaly: Secondary | ICD-10-CM | POA: Diagnosis not present

## 2014-01-18 DIAGNOSIS — E871 Hypo-osmolality and hyponatremia: Secondary | ICD-10-CM | POA: Diagnosis not present

## 2014-01-18 DIAGNOSIS — Z23 Encounter for immunization: Secondary | ICD-10-CM | POA: Diagnosis not present

## 2014-01-24 ENCOUNTER — Ambulatory Visit (INDEPENDENT_AMBULATORY_CARE_PROVIDER_SITE_OTHER): Payer: Medicare Other | Admitting: Ophthalmology

## 2014-01-24 DIAGNOSIS — H35059 Retinal neovascularization, unspecified, unspecified eye: Secondary | ICD-10-CM

## 2014-02-08 DIAGNOSIS — L280 Lichen simplex chronicus: Secondary | ICD-10-CM | POA: Diagnosis not present

## 2014-02-08 DIAGNOSIS — L821 Other seborrheic keratosis: Secondary | ICD-10-CM | POA: Diagnosis not present

## 2014-02-08 DIAGNOSIS — L819 Disorder of pigmentation, unspecified: Secondary | ICD-10-CM | POA: Diagnosis not present

## 2014-02-08 DIAGNOSIS — L570 Actinic keratosis: Secondary | ICD-10-CM | POA: Diagnosis not present

## 2014-02-08 DIAGNOSIS — D1801 Hemangioma of skin and subcutaneous tissue: Secondary | ICD-10-CM | POA: Diagnosis not present

## 2014-02-11 DIAGNOSIS — E7800 Pure hypercholesterolemia, unspecified: Secondary | ICD-10-CM | POA: Diagnosis not present

## 2014-02-11 DIAGNOSIS — R5383 Other fatigue: Secondary | ICD-10-CM | POA: Diagnosis not present

## 2014-02-11 DIAGNOSIS — I10 Essential (primary) hypertension: Secondary | ICD-10-CM | POA: Diagnosis not present

## 2014-02-11 DIAGNOSIS — R5381 Other malaise: Secondary | ICD-10-CM | POA: Diagnosis not present

## 2014-02-11 DIAGNOSIS — Z0000 Encounter for general adult medical examination without abnormal findings: Secondary | ICD-10-CM | POA: Diagnosis not present

## 2014-02-11 DIAGNOSIS — Z79899 Other long term (current) drug therapy: Secondary | ICD-10-CM | POA: Diagnosis not present

## 2014-02-11 DIAGNOSIS — R0602 Shortness of breath: Secondary | ICD-10-CM | POA: Diagnosis not present

## 2014-02-27 ENCOUNTER — Encounter (INDEPENDENT_AMBULATORY_CARE_PROVIDER_SITE_OTHER): Payer: Medicare Other | Admitting: Ophthalmology

## 2014-02-27 DIAGNOSIS — H318 Other specified disorders of choroid: Secondary | ICD-10-CM | POA: Diagnosis not present

## 2014-03-01 DIAGNOSIS — Z950 Presence of cardiac pacemaker: Secondary | ICD-10-CM | POA: Diagnosis not present

## 2014-03-01 DIAGNOSIS — E780 Pure hypercholesterolemia: Secondary | ICD-10-CM | POA: Diagnosis not present

## 2014-03-01 DIAGNOSIS — E871 Hypo-osmolality and hyponatremia: Secondary | ICD-10-CM | POA: Diagnosis not present

## 2014-03-01 DIAGNOSIS — Z23 Encounter for immunization: Secondary | ICD-10-CM | POA: Diagnosis not present

## 2014-03-01 DIAGNOSIS — I10 Essential (primary) hypertension: Secondary | ICD-10-CM | POA: Diagnosis not present

## 2014-03-01 DIAGNOSIS — K219 Gastro-esophageal reflux disease without esophagitis: Secondary | ICD-10-CM | POA: Diagnosis not present

## 2014-03-01 DIAGNOSIS — R1310 Dysphagia, unspecified: Secondary | ICD-10-CM | POA: Diagnosis not present

## 2014-03-08 DIAGNOSIS — J441 Chronic obstructive pulmonary disease with (acute) exacerbation: Secondary | ICD-10-CM | POA: Diagnosis not present

## 2014-03-08 DIAGNOSIS — Z87891 Personal history of nicotine dependence: Secondary | ICD-10-CM | POA: Diagnosis not present

## 2014-03-26 DIAGNOSIS — R1310 Dysphagia, unspecified: Secondary | ICD-10-CM | POA: Diagnosis not present

## 2014-03-26 DIAGNOSIS — K219 Gastro-esophageal reflux disease without esophagitis: Secondary | ICD-10-CM | POA: Diagnosis not present

## 2014-04-08 DIAGNOSIS — H35373 Puckering of macula, bilateral: Secondary | ICD-10-CM | POA: Diagnosis not present

## 2014-04-25 ENCOUNTER — Encounter (HOSPITAL_COMMUNITY): Payer: Self-pay | Admitting: Internal Medicine

## 2014-05-16 DIAGNOSIS — I10 Essential (primary) hypertension: Secondary | ICD-10-CM | POA: Diagnosis not present

## 2014-05-16 DIAGNOSIS — I2510 Atherosclerotic heart disease of native coronary artery without angina pectoris: Secondary | ICD-10-CM | POA: Diagnosis not present

## 2014-05-16 DIAGNOSIS — J449 Chronic obstructive pulmonary disease, unspecified: Secondary | ICD-10-CM | POA: Diagnosis not present

## 2014-05-16 DIAGNOSIS — R062 Wheezing: Secondary | ICD-10-CM | POA: Diagnosis not present

## 2014-05-16 DIAGNOSIS — J40 Bronchitis, not specified as acute or chronic: Secondary | ICD-10-CM | POA: Diagnosis not present

## 2014-05-16 DIAGNOSIS — E871 Hypo-osmolality and hyponatremia: Secondary | ICD-10-CM | POA: Diagnosis not present

## 2014-05-28 DIAGNOSIS — I2510 Atherosclerotic heart disease of native coronary artery without angina pectoris: Secondary | ICD-10-CM | POA: Diagnosis not present

## 2014-05-28 DIAGNOSIS — I119 Hypertensive heart disease without heart failure: Secondary | ICD-10-CM | POA: Diagnosis not present

## 2014-05-29 ENCOUNTER — Encounter (INDEPENDENT_AMBULATORY_CARE_PROVIDER_SITE_OTHER): Payer: Medicare Other | Admitting: Ophthalmology

## 2014-05-29 DIAGNOSIS — H35033 Hypertensive retinopathy, bilateral: Secondary | ICD-10-CM | POA: Diagnosis not present

## 2014-05-29 DIAGNOSIS — I10 Essential (primary) hypertension: Secondary | ICD-10-CM | POA: Diagnosis not present

## 2014-05-29 DIAGNOSIS — H318 Other specified disorders of choroid: Secondary | ICD-10-CM

## 2014-05-29 DIAGNOSIS — H43813 Vitreous degeneration, bilateral: Secondary | ICD-10-CM

## 2014-05-29 DIAGNOSIS — H35373 Puckering of macula, bilateral: Secondary | ICD-10-CM

## 2014-05-30 DIAGNOSIS — I2510 Atherosclerotic heart disease of native coronary artery without angina pectoris: Secondary | ICD-10-CM | POA: Diagnosis not present

## 2014-05-30 DIAGNOSIS — I10 Essential (primary) hypertension: Secondary | ICD-10-CM | POA: Diagnosis not present

## 2014-05-30 DIAGNOSIS — B370 Candidal stomatitis: Secondary | ICD-10-CM | POA: Diagnosis not present

## 2014-05-30 DIAGNOSIS — R062 Wheezing: Secondary | ICD-10-CM | POA: Diagnosis not present

## 2014-05-30 DIAGNOSIS — J40 Bronchitis, not specified as acute or chronic: Secondary | ICD-10-CM | POA: Diagnosis not present

## 2014-05-30 DIAGNOSIS — J449 Chronic obstructive pulmonary disease, unspecified: Secondary | ICD-10-CM | POA: Diagnosis not present

## 2014-05-30 DIAGNOSIS — Q211 Atrial septal defect: Secondary | ICD-10-CM | POA: Diagnosis not present

## 2014-05-30 DIAGNOSIS — E871 Hypo-osmolality and hyponatremia: Secondary | ICD-10-CM | POA: Diagnosis not present

## 2014-06-01 ENCOUNTER — Other Ambulatory Visit: Payer: Self-pay | Admitting: Neurology

## 2014-06-15 ENCOUNTER — Other Ambulatory Visit: Payer: Self-pay | Admitting: Neurology

## 2014-06-24 DIAGNOSIS — E871 Hypo-osmolality and hyponatremia: Secondary | ICD-10-CM | POA: Diagnosis not present

## 2014-06-26 DIAGNOSIS — E871 Hypo-osmolality and hyponatremia: Secondary | ICD-10-CM | POA: Diagnosis not present

## 2014-06-26 DIAGNOSIS — E875 Hyperkalemia: Secondary | ICD-10-CM | POA: Diagnosis not present

## 2014-06-26 DIAGNOSIS — I10 Essential (primary) hypertension: Secondary | ICD-10-CM | POA: Diagnosis not present

## 2014-06-28 DIAGNOSIS — Z79899 Other long term (current) drug therapy: Secondary | ICD-10-CM | POA: Diagnosis not present

## 2014-06-28 DIAGNOSIS — I2510 Atherosclerotic heart disease of native coronary artery without angina pectoris: Secondary | ICD-10-CM | POA: Diagnosis not present

## 2014-06-28 DIAGNOSIS — I951 Orthostatic hypotension: Secondary | ICD-10-CM | POA: Diagnosis not present

## 2014-06-28 DIAGNOSIS — I119 Hypertensive heart disease without heart failure: Secondary | ICD-10-CM | POA: Diagnosis not present

## 2014-06-28 DIAGNOSIS — Z951 Presence of aortocoronary bypass graft: Secondary | ICD-10-CM | POA: Diagnosis not present

## 2014-06-28 DIAGNOSIS — E785 Hyperlipidemia, unspecified: Secondary | ICD-10-CM | POA: Diagnosis not present

## 2014-06-28 DIAGNOSIS — Z8673 Personal history of transient ischemic attack (TIA), and cerebral infarction without residual deficits: Secondary | ICD-10-CM | POA: Diagnosis not present

## 2014-06-28 DIAGNOSIS — I208 Other forms of angina pectoris: Secondary | ICD-10-CM | POA: Diagnosis not present

## 2014-06-28 DIAGNOSIS — Z950 Presence of cardiac pacemaker: Secondary | ICD-10-CM | POA: Diagnosis not present

## 2014-07-29 DIAGNOSIS — K219 Gastro-esophageal reflux disease without esophagitis: Secondary | ICD-10-CM | POA: Diagnosis not present

## 2014-07-29 DIAGNOSIS — E780 Pure hypercholesterolemia: Secondary | ICD-10-CM | POA: Diagnosis not present

## 2014-07-29 DIAGNOSIS — Z950 Presence of cardiac pacemaker: Secondary | ICD-10-CM | POA: Diagnosis not present

## 2014-07-29 DIAGNOSIS — J449 Chronic obstructive pulmonary disease, unspecified: Secondary | ICD-10-CM | POA: Diagnosis not present

## 2014-07-29 DIAGNOSIS — I10 Essential (primary) hypertension: Secondary | ICD-10-CM | POA: Diagnosis not present

## 2014-07-29 DIAGNOSIS — E871 Hypo-osmolality and hyponatremia: Secondary | ICD-10-CM | POA: Diagnosis not present

## 2014-07-30 DIAGNOSIS — I951 Orthostatic hypotension: Secondary | ICD-10-CM | POA: Diagnosis not present

## 2014-07-30 DIAGNOSIS — I208 Other forms of angina pectoris: Secondary | ICD-10-CM | POA: Diagnosis not present

## 2014-07-30 DIAGNOSIS — Z8673 Personal history of transient ischemic attack (TIA), and cerebral infarction without residual deficits: Secondary | ICD-10-CM | POA: Diagnosis not present

## 2014-07-30 DIAGNOSIS — R070 Pain in throat: Secondary | ICD-10-CM | POA: Diagnosis not present

## 2014-07-30 DIAGNOSIS — Z79899 Other long term (current) drug therapy: Secondary | ICD-10-CM | POA: Diagnosis not present

## 2014-07-30 DIAGNOSIS — I119 Hypertensive heart disease without heart failure: Secondary | ICD-10-CM | POA: Diagnosis not present

## 2014-07-30 DIAGNOSIS — Z951 Presence of aortocoronary bypass graft: Secondary | ICD-10-CM | POA: Diagnosis not present

## 2014-07-30 DIAGNOSIS — I2510 Atherosclerotic heart disease of native coronary artery without angina pectoris: Secondary | ICD-10-CM | POA: Diagnosis not present

## 2014-07-30 DIAGNOSIS — E785 Hyperlipidemia, unspecified: Secondary | ICD-10-CM | POA: Diagnosis not present

## 2014-07-31 ENCOUNTER — Ambulatory Visit
Admission: RE | Admit: 2014-07-31 | Discharge: 2014-07-31 | Disposition: A | Discharge status: Home / Self Care | Payer: Medicare Other | Source: Ambulatory Visit | Attending: Cardiology | Admitting: Cardiology

## 2014-07-31 ENCOUNTER — Other Ambulatory Visit: Payer: Self-pay | Admitting: Cardiology

## 2014-07-31 DIAGNOSIS — M5031 Other cervical disc degeneration,  high cervical region: Secondary | ICD-10-CM | POA: Diagnosis not present

## 2014-07-31 DIAGNOSIS — R070 Pain in throat: Secondary | ICD-10-CM

## 2014-07-31 DIAGNOSIS — M4802 Spinal stenosis, cervical region: Secondary | ICD-10-CM | POA: Diagnosis not present

## 2014-07-31 DIAGNOSIS — M5032 Other cervical disc degeneration, mid-cervical region: Secondary | ICD-10-CM | POA: Diagnosis not present

## 2014-08-06 DIAGNOSIS — I2510 Atherosclerotic heart disease of native coronary artery without angina pectoris: Secondary | ICD-10-CM | POA: Diagnosis not present

## 2014-08-06 DIAGNOSIS — M4982 Spondylopathy in diseases classified elsewhere, cervical region: Secondary | ICD-10-CM | POA: Diagnosis not present

## 2014-08-06 DIAGNOSIS — I119 Hypertensive heart disease without heart failure: Secondary | ICD-10-CM | POA: Diagnosis not present

## 2014-08-06 DIAGNOSIS — R070 Pain in throat: Secondary | ICD-10-CM | POA: Diagnosis not present

## 2014-08-06 DIAGNOSIS — Z951 Presence of aortocoronary bypass graft: Secondary | ICD-10-CM | POA: Diagnosis not present

## 2014-08-06 DIAGNOSIS — I208 Other forms of angina pectoris: Secondary | ICD-10-CM | POA: Diagnosis not present

## 2014-08-06 DIAGNOSIS — E785 Hyperlipidemia, unspecified: Secondary | ICD-10-CM | POA: Diagnosis not present

## 2014-08-06 DIAGNOSIS — I951 Orthostatic hypotension: Secondary | ICD-10-CM | POA: Diagnosis not present

## 2014-08-15 DIAGNOSIS — Z951 Presence of aortocoronary bypass graft: Secondary | ICD-10-CM | POA: Diagnosis not present

## 2014-08-15 DIAGNOSIS — R070 Pain in throat: Secondary | ICD-10-CM | POA: Diagnosis not present

## 2014-08-15 DIAGNOSIS — I208 Other forms of angina pectoris: Secondary | ICD-10-CM | POA: Diagnosis not present

## 2014-08-15 DIAGNOSIS — Z950 Presence of cardiac pacemaker: Secondary | ICD-10-CM | POA: Diagnosis not present

## 2014-08-15 DIAGNOSIS — I2510 Atherosclerotic heart disease of native coronary artery without angina pectoris: Secondary | ICD-10-CM | POA: Diagnosis not present

## 2014-08-15 DIAGNOSIS — M4982 Spondylopathy in diseases classified elsewhere, cervical region: Secondary | ICD-10-CM | POA: Diagnosis not present

## 2014-08-15 DIAGNOSIS — I951 Orthostatic hypotension: Secondary | ICD-10-CM | POA: Diagnosis not present

## 2014-08-15 DIAGNOSIS — E785 Hyperlipidemia, unspecified: Secondary | ICD-10-CM | POA: Diagnosis not present

## 2014-08-15 DIAGNOSIS — I119 Hypertensive heart disease without heart failure: Secondary | ICD-10-CM | POA: Diagnosis not present

## 2014-09-03 DIAGNOSIS — R531 Weakness: Secondary | ICD-10-CM | POA: Diagnosis not present

## 2014-09-05 DIAGNOSIS — M549 Dorsalgia, unspecified: Secondary | ICD-10-CM | POA: Diagnosis not present

## 2014-09-09 DIAGNOSIS — I951 Orthostatic hypotension: Secondary | ICD-10-CM | POA: Diagnosis not present

## 2014-09-09 DIAGNOSIS — R55 Syncope and collapse: Secondary | ICD-10-CM | POA: Diagnosis not present

## 2014-09-09 DIAGNOSIS — I119 Hypertensive heart disease without heart failure: Secondary | ICD-10-CM | POA: Diagnosis not present

## 2014-09-09 DIAGNOSIS — I208 Other forms of angina pectoris: Secondary | ICD-10-CM | POA: Diagnosis not present

## 2014-09-09 DIAGNOSIS — Z79899 Other long term (current) drug therapy: Secondary | ICD-10-CM | POA: Diagnosis not present

## 2014-09-09 DIAGNOSIS — I2510 Atherosclerotic heart disease of native coronary artery without angina pectoris: Secondary | ICD-10-CM | POA: Diagnosis not present

## 2014-09-09 DIAGNOSIS — E785 Hyperlipidemia, unspecified: Secondary | ICD-10-CM | POA: Diagnosis not present

## 2014-09-09 DIAGNOSIS — Z951 Presence of aortocoronary bypass graft: Secondary | ICD-10-CM | POA: Diagnosis not present

## 2014-09-18 ENCOUNTER — Encounter (HOSPITAL_COMMUNITY): Payer: Self-pay | Admitting: Radiology

## 2014-09-18 ENCOUNTER — Inpatient Hospital Stay (HOSPITAL_COMMUNITY)
Admission: EM | Admit: 2014-09-18 | Discharge: 2014-09-20 | DRG: 069 | Disposition: A | Discharge status: Home Health | Payer: Medicare Other | Attending: Internal Medicine | Admitting: Internal Medicine

## 2014-09-18 ENCOUNTER — Emergency Department (HOSPITAL_COMMUNITY): Payer: Medicare Other

## 2014-09-18 ENCOUNTER — Other Ambulatory Visit (HOSPITAL_COMMUNITY): Payer: Self-pay

## 2014-09-18 DIAGNOSIS — R130 Aphagia: Secondary | ICD-10-CM | POA: Diagnosis not present

## 2014-09-18 DIAGNOSIS — Z87891 Personal history of nicotine dependence: Secondary | ICD-10-CM | POA: Diagnosis not present

## 2014-09-18 DIAGNOSIS — Z951 Presence of aortocoronary bypass graft: Secondary | ICD-10-CM | POA: Diagnosis not present

## 2014-09-18 DIAGNOSIS — Z9842 Cataract extraction status, left eye: Secondary | ICD-10-CM

## 2014-09-18 DIAGNOSIS — E785 Hyperlipidemia, unspecified: Secondary | ICD-10-CM | POA: Diagnosis present

## 2014-09-18 DIAGNOSIS — Z79899 Other long term (current) drug therapy: Secondary | ICD-10-CM | POA: Diagnosis not present

## 2014-09-18 DIAGNOSIS — I10 Essential (primary) hypertension: Secondary | ICD-10-CM

## 2014-09-18 DIAGNOSIS — R569 Unspecified convulsions: Secondary | ICD-10-CM | POA: Diagnosis not present

## 2014-09-18 DIAGNOSIS — Z7902 Long term (current) use of antithrombotics/antiplatelets: Secondary | ICD-10-CM | POA: Diagnosis not present

## 2014-09-18 DIAGNOSIS — R4701 Aphasia: Secondary | ICD-10-CM | POA: Diagnosis present

## 2014-09-18 DIAGNOSIS — K219 Gastro-esophageal reflux disease without esophagitis: Secondary | ICD-10-CM | POA: Diagnosis present

## 2014-09-18 DIAGNOSIS — I119 Hypertensive heart disease without heart failure: Secondary | ICD-10-CM | POA: Diagnosis present

## 2014-09-18 DIAGNOSIS — Z8673 Personal history of transient ischemic attack (TIA), and cerebral infarction without residual deficits: Secondary | ICD-10-CM | POA: Diagnosis not present

## 2014-09-18 DIAGNOSIS — Q211 Atrial septal defect: Secondary | ICD-10-CM | POA: Diagnosis not present

## 2014-09-18 DIAGNOSIS — E669 Obesity, unspecified: Secondary | ICD-10-CM | POA: Diagnosis present

## 2014-09-18 DIAGNOSIS — R4702 Dysphasia: Secondary | ICD-10-CM | POA: Diagnosis present

## 2014-09-18 DIAGNOSIS — G458 Other transient cerebral ischemic attacks and related syndromes: Secondary | ICD-10-CM

## 2014-09-18 DIAGNOSIS — Z6831 Body mass index (BMI) 31.0-31.9, adult: Secondary | ICD-10-CM

## 2014-09-18 DIAGNOSIS — Z9841 Cataract extraction status, right eye: Secondary | ICD-10-CM | POA: Diagnosis not present

## 2014-09-18 DIAGNOSIS — Q2112 Patent foramen ovale: Secondary | ICD-10-CM

## 2014-09-18 DIAGNOSIS — F1010 Alcohol abuse, uncomplicated: Secondary | ICD-10-CM | POA: Diagnosis present

## 2014-09-18 DIAGNOSIS — I2510 Atherosclerotic heart disease of native coronary artery without angina pectoris: Secondary | ICD-10-CM | POA: Diagnosis present

## 2014-09-18 DIAGNOSIS — Z950 Presence of cardiac pacemaker: Secondary | ICD-10-CM | POA: Diagnosis present

## 2014-09-18 DIAGNOSIS — I639 Cerebral infarction, unspecified: Secondary | ICD-10-CM | POA: Diagnosis not present

## 2014-09-18 DIAGNOSIS — G451 Carotid artery syndrome (hemispheric): Secondary | ICD-10-CM | POA: Diagnosis not present

## 2014-09-18 DIAGNOSIS — G311 Senile degeneration of brain, not elsewhere classified: Secondary | ICD-10-CM | POA: Diagnosis not present

## 2014-09-18 DIAGNOSIS — Z961 Presence of intraocular lens: Secondary | ICD-10-CM | POA: Diagnosis present

## 2014-09-18 DIAGNOSIS — R4781 Slurred speech: Secondary | ICD-10-CM | POA: Diagnosis not present

## 2014-09-18 DIAGNOSIS — F8181 Disorder of written expression: Secondary | ICD-10-CM | POA: Diagnosis not present

## 2014-09-18 DIAGNOSIS — I6789 Other cerebrovascular disease: Secondary | ICD-10-CM | POA: Diagnosis not present

## 2014-09-18 DIAGNOSIS — G459 Transient cerebral ischemic attack, unspecified: Principal | ICD-10-CM | POA: Diagnosis present

## 2014-09-18 DIAGNOSIS — E871 Hypo-osmolality and hyponatremia: Secondary | ICD-10-CM | POA: Diagnosis present

## 2014-09-18 DIAGNOSIS — R1310 Dysphagia, unspecified: Secondary | ICD-10-CM | POA: Diagnosis not present

## 2014-09-18 HISTORY — DX: Unspecified convulsions: R56.9

## 2014-09-18 LAB — I-STAT CHEM 8, ED
BUN: 17 mg/dL (ref 6–20)
Calcium, Ion: 1.15 mmol/L (ref 1.13–1.30)
Chloride: 96 mmol/L — ABNORMAL LOW (ref 101–111)
Creatinine, Ser: 1 mg/dL (ref 0.61–1.24)
Glucose, Bld: 119 mg/dL — ABNORMAL HIGH (ref 70–99)
HEMATOCRIT: 39 % (ref 39.0–52.0)
Hemoglobin: 13.3 g/dL (ref 13.0–17.0)
POTASSIUM: 4.3 mmol/L (ref 3.5–5.1)
Sodium: 129 mmol/L — ABNORMAL LOW (ref 135–145)
TCO2: 20 mmol/L (ref 0–100)

## 2014-09-18 LAB — CBG MONITORING, ED: Glucose-Capillary: 110 mg/dL — ABNORMAL HIGH (ref 70–99)

## 2014-09-18 LAB — I-STAT TROPONIN, ED: Troponin i, poc: 0.01 ng/mL (ref 0.00–0.08)

## 2014-09-18 MED ORDER — METOPROLOL TARTRATE 50 MG PO TABS
50.0000 mg | ORAL_TABLET | Freq: Every day | ORAL | Status: DC
  Administered 2014-09-19 – 2014-09-20 (×2): 50 mg via ORAL
  Filled 2014-09-18 (×2): qty 1

## 2014-09-18 MED ORDER — PANTOPRAZOLE SODIUM 40 MG PO TBEC
80.0000 mg | DELAYED_RELEASE_TABLET | Freq: Every day | ORAL | Status: DC
  Administered 2014-09-19 – 2014-09-20 (×2): 80 mg via ORAL
  Filled 2014-09-18 (×2): qty 2

## 2014-09-18 MED ORDER — CLOPIDOGREL BISULFATE 75 MG PO TABS
75.0000 mg | ORAL_TABLET | Freq: Every day | ORAL | Status: DC
  Administered 2014-09-19 – 2014-09-20 (×2): 75 mg via ORAL
  Filled 2014-09-18 (×2): qty 1

## 2014-09-18 MED ORDER — HEPARIN SODIUM (PORCINE) 5000 UNIT/ML IJ SOLN
5000.0000 [IU] | Freq: Three times a day (TID) | INTRAMUSCULAR | Status: DC
  Administered 2014-09-19 – 2014-09-20 (×5): 5000 [IU] via SUBCUTANEOUS
  Filled 2014-09-18 (×5): qty 1

## 2014-09-18 MED ORDER — ROSUVASTATIN CALCIUM 20 MG PO TABS
20.0000 mg | ORAL_TABLET | Freq: Every day | ORAL | Status: DC
  Administered 2014-09-19 – 2014-09-20 (×2): 20 mg via ORAL
  Filled 2014-09-18 (×2): qty 1

## 2014-09-18 MED ORDER — IRBESARTAN 150 MG PO TABS
150.0000 mg | ORAL_TABLET | Freq: Every day | ORAL | Status: DC
  Administered 2014-09-19 – 2014-09-20 (×2): 150 mg via ORAL
  Filled 2014-09-18 (×2): qty 1

## 2014-09-18 MED ORDER — STROKE: EARLY STAGES OF RECOVERY BOOK
Freq: Once | Status: AC
  Administered 2014-09-19: 01:00:00
  Filled 2014-09-18: qty 1

## 2014-09-18 MED ORDER — MONTELUKAST SODIUM 10 MG PO TABS: 10.0000 mg | ORAL_TABLET | Freq: Every evening | ORAL | Status: DC

## 2014-09-18 NOTE — H&P (Addendum)
[facility] History and Physical  SALMAN WELLEN TDD:[id number] DOB: August 28, 1925 DOA: 09/18/2014  Referring physician: EDP PCP:  Melinda Crutch, MD   Chief Complaint: Aphasia   HPI: Clinton Hughes is a 81 y.o. male who is brought in to the ED with c/o sudden onset of aphasia that started at 2100 this evening while watching TV.  Apparently per wife, he developed a sudden blank stare, difficulty following storyline, then had difficulty expressing himself and not acting right.  He states he couldn't articulate what he wanted to say.  No noticeable face-arm-leg weakness.  Brought to ED, initially had expressive aphasia with NIHSS 2.  This has rapidly resolved without treatment.  Has had 3-4 similar episodes over past 2 years, always recovers, extensive outpatient neurologic work up at [facility] is unrevealing.  Review of Systems: Systems reviewed.  As above, otherwise negative  Past Medical History  Diagnosis Date  . Coronary artery disease   . Movement disorder   . H/O hiatal hernia   . High cholesterol   . Pneumonia 2014 X1  . Pneumonia and influenza 2013 X2  . GERD (gastroesophageal reflux disease)   . TIA (transient ischemic attack) 03/02/2013    Spelled of speech arrest during hospitalization in October of this year. Dr. that he was consult and in the hospital. Dr. Wynonia Lawman just applied a cardiac monitor and performed an echocardiogram. Final approval of proximal stenosis has been seen. Patient here today to followup after hospitalization and more presumed TIAs with speech arrest numbness of her hands lips right side of the face.4  events i  . Lumbar disc disease   . PFO (patent foramen ovale)   . Hypertensive heart disease   . Stroke   . Seizures    Past Surgical History  Procedure Laterality Date  . Colonoscopy  2006/2008  . Lumbar laminectomy  1980    "L5?" (02/14/2013)  . Cataract extraction w/ intraocular lens  implant, bilateral Bilateral 2007-2010  . Pacemaker insertion   08/14/13    MDT Adapta L implanted by Dr Rayann Heman for complete heart blcok  . Back surgery    . Coronary artery bypass graft  1995    "CABG X5" (02/14/2013)  . Refractive surgery Right ~ 02/07/2013    "to clear a couple clouds off" (02/14/2013)  . Tee without cardioversion N/A 03/08/2013    Procedure: TRANSESOPHAGEAL ECHOCARDIOGRAM (TEE);  Surgeon: Fay Records, MD;  Location: [facility] ENDOSCOPY;  Service: Cardiovascular;  Laterality: N/A;  . Permanent pacemaker insertion N/A 08/14/2013    Procedure: PERMANENT PACEMAKER INSERTION;  Surgeon: Coralyn Mark, MD;  Location: [place] CATH LAB;  Service: Cardiovascular;  Laterality: N/A;   Social History:  reports that he has quit smoking. His smoking use included Cigarettes. He has a 1.5 pack-year smoking history. He has never used smokeless tobacco. He reports that he drinks alcohol. He reports that he does not use illicit drugs.  No Known Allergies  Family History  Problem Relation Age of Onset  . Thyroid disease Sister   . Thyroid disease Other   . Epilepsy Grandchild   . Sleep walking Daughter      Prior to Admission medications   Medication Sig Start Date End Date Taking? Authorizing Provider  clopidogrel (PLAVIX) 75 MG tablet TAKE 1 TABLET BY MOUTH EVERY DAY 06/02/14  Yes Carmen Dohmeier, MD  fluticasone (FLONASE) 50 MCG/ACT nasal spray Place 2 sprays into both nostrils as needed. 11/05/13  Yes Historical Provider, MD  metoprolol (LOPRESSOR) 50 MG  tablet Take 50 mg by mouth daily.    Yes Historical Provider, MD  montelukast (SINGULAIR) 10 MG tablet Take 1 tablet by mouth every evening. 12/01/13  Yes Historical Provider, MD  omeprazole (PRILOSEC) 40 MG capsule Take 40 mg by mouth daily.   Yes Historical Provider, MD  rosuvastatin (CRESTOR) 20 MG tablet Take 20 mg by mouth daily.   Yes Historical Provider, MD  valsartan (DIOVAN) 160 MG tablet Take 80 mg by mouth daily.    Yes Historical Provider, MD   Physical Exam: Filed Vitals:   09/18/14 2300  BP:  153/78  Pulse: 95  Temp:   Resp: 15    BP 153/78 mmHg  Pulse 95  Temp(Src) 97.9 F (36.6 C) (Oral)  Resp 15  Ht 5' 8.9" (1.75 m)  Wt 97.07 kg (214 lb)  BMI 31.70 kg/m2  SpO2 97%  General Appearance:    Alert, oriented, no distress, appears stated age  Head:    Normocephalic, atraumatic  Eyes:    PERRL, EOMI, sclera non-icteric        Nose:   Nares without drainage or epistaxis. Mucosa, turbinates normal  Throat:   Moist mucous membranes. Oropharynx without erythema or exudate.  Neck:   Supple. No carotid bruits.  No thyromegaly.  No lymphadenopathy.   Back:     No CVA tenderness, no spinal tenderness  Lungs:     Clear to auscultation bilaterally, without wheezes, rhonchi or rales  Chest wall:    No tenderness to palpitation  Heart:    Regular rate and rhythm without murmurs, gallops, rubs  Abdomen:     Soft, non-tender, nondistended, normal bowel sounds, no organomegaly  Genitalia:    deferred  Rectal:    deferred  Extremities:   No clubbing, cyanosis or edema.  Pulses:   2+ and symmetric all extremities  Skin:   Skin color, texture, turgor normal, no rashes or lesions  Lymph nodes:   Cervical, supraclavicular, and axillary nodes normal  Neurologic:   CNII-XII intact. Normal strength, sensation and reflexes      throughout    Labs on Admission:  Basic Metabolic Panel:  Recent Labs Lab 09/18/14 2230  NA 129*  K 4.3  CL 96*  GLUCOSE 119*  BUN 17  CREATININE 1.00   Liver Function Tests: No results for input(s): AST, ALT, ALKPHOS, BILITOT, PROT, ALBUMIN in the last 168 hours. No results for input(s): LIPASE, AMYLASE in the last 168 hours. No results for input(s): AMMONIA in the last 168 hours. CBC:  Recent Labs Lab 09/18/14 2230  HGB 13.3  HCT 39.0   Cardiac Enzymes: No results for input(s): CKTOTAL, CKMB, CKMBINDEX, TROPONINI in the last 168 hours.  BNP (last 3 results) No results for input(s): PROBNP in the last 8760 hours. CBG:  Recent Labs Lab  09/18/14 2242  GLUCAP 110*    Radiological Exams on Admission: Ct Head Wo Contrast  09/18/2014   CLINICAL DATA:  Aphasia.  EXAM: CT HEAD WITHOUT CONTRAST  TECHNIQUE: Contiguous axial images were obtained from the base of the skull through the vertex without intravenous contrast.  COMPARISON:  MRI 02/15/2013  FINDINGS: There is no intracranial hemorrhage, mass or evidence of acute infarction. There is moderate generalized atrophy. There is mild periventricular hypodensity which may represent chronic small vessel disease. The appearances are similar to the MRI of 02/15/2013. No superimposed acute finding is evident. Bones are intact, with mild chronic maxillary sinus disease which is also unchanged from 02/15/2013.  IMPRESSION:  Moderate atrophy and mild chronic small vessel changes. No acute intracranial findings. These results were called by telephone at the time of interpretation on 09/18/2014 at 10:46 pm to Dr. Milton Ferguson , who verbally acknowledged these results.   Electronically Signed   By: Andreas Newport M.D.   On: 09/18/2014 22:48    EKG: Independently reviewed.  Assessment/Plan Active Problems:   TIA (transient ischemic attack)   Hyperlipidemia   PFO (patent foramen ovale)   Hyponatremia   Cardiac pacemaker in situ   1. TIA - DDX include seizure 1. Labs ordered 2. Cannot obtain MRI due to presence of pacemaker 3. Carotid dopplers ordered 4. 2d echo ordered, no contrast due to PFO 5. Tele monitor 6. Neuro checks 7. EEG ordered 2. HTN - continue home meds 3. Hyponatremia - 129 today, was 125 last week, is seeing nephrology for this, low but somewhat high to be causing seizures. 4. EtOH abuse - chronic daily drinking, patient states "2-3 glasses a day" wife states patient is "significantly underestimating".  Do think that this can cause the above issues.   Code Status: Full Code  Family Communication: No family in room Disposition Plan: Admit to inpatient   Time spent: 70  min  GARDNER, JARED M. [facility] Pager [phone]  If 7AM-7PM, please contact the day team taking care of the patient [email or web address] Password [email or web address] 09/18/2014, 11:31 PM

## 2014-09-18 NOTE — ED Notes (Signed)
MD at bedside. 

## 2014-09-18 NOTE — ED Notes (Signed)
Pt arrived via [facility] from home as a code stroke.  LSN at 2030, presents with Aphasia.  Meet at bridge by Dr Aram Beecham, ED RN, RR RN, lab and PharmD.  Taken to CT (negative) and then A4.  NIHSS 2 for aphasia, which improved directly after exam.  Per pt's wife, pt has had several similar incidents recently that have also resolved.  No TPA at this time, will be admitted for stroke work up.  Pt and family updated and agree with plan.  Pt will be in TPA window until 0030.  Bedside handoff to ED RN [place].

## 2014-09-18 NOTE — ED Notes (Signed)
Per EMS pt was at home watching a forigen language movie with wife; per wife pt started to be confused about the movie and could not get thoughts out; No pain noted; no LOC; pt able to ambulate on EMS arrival; Pt was having trouble expressing self; no visual changes noted; Pt a&o on arrival x 4; Pt NIHSS =2;

## 2014-09-18 NOTE — ED Provider Notes (Addendum)
CSN: [id number]     Arrival date & time 09/18/14  2221 History  This chart was scribed for Leyda Vanderwerf, MD by Rayfield Citizen, ED Scribe. This patient was seen in room A04C/A04C and the patient's care was started at 11:05 PM.    Chief Complaint  Patient presents with  . Code Stroke   Patient is a 81 y.o. male presenting with Acute Neurological Problem. The history is provided by the patient and the spouse. No language interpreter was used.  Cerebrovascular Accident This is a recurrent problem. The current episode started 1 to 2 hours ago. The problem occurs rarely. The problem has been rapidly improving. Pertinent negatives include no chest pain, no abdominal pain, no headaches and no shortness of breath. Nothing aggravates the symptoms. Nothing relieves the symptoms. He has tried nothing for the symptoms. The treatment provided moderate relief.    HPI Comments: Clinton Hughes is a 81 y.o. male who presents to the Emergency Department complaining of sudden onset aphasia, states "I couldn't make words," around 21:00 while resting comfortably. Patient explains that this is his fourth episode of similar symptoms this year; each episode usually lasts approximately 45 minutes before resolving without treatment. Per EMS, patient was able to ambulate on arrival but was having difficulty expressing himself. He denies LOC, visual disturbance. He denies any other complaints at this time. Patient reports he feels "fine" at present.   Dr. Brett Hughes and Dr. Leonie Hughes managed prior neurological care.   Past Medical History  Diagnosis Date  . Coronary artery disease   . Movement disorder   . H/O hiatal hernia   . High cholesterol   . Pneumonia 2014 X1  . Pneumonia and influenza 2013 X2  . GERD (gastroesophageal reflux disease)   . TIA (transient ischemic attack) 03/02/2013    Spelled of speech arrest during hospitalization in October of this year. Dr. that he was consult and in the hospital. Dr. Wynonia Lawman just  applied a cardiac monitor and performed an echocardiogram. Final approval of proximal stenosis has been seen. Patient here today to followup after hospitalization and more presumed TIAs with speech arrest numbness of her hands lips right side of the face.4  events i  . Lumbar disc disease   . PFO (patent foramen ovale)   . Hypertensive heart disease   . Stroke   . Seizures    Past Surgical History  Procedure Laterality Date  . Colonoscopy  2006/2008  . Lumbar laminectomy  1980    "L5?" (02/14/2013)  . Cataract extraction w/ intraocular lens  implant, bilateral Bilateral 2007-2010  . Pacemaker insertion  08/14/13    MDT Adapta L implanted by Dr Clinton Hughes for complete heart blcok  . Back surgery    . Coronary artery bypass graft  1995    "CABG X5" (02/14/2013)  . Refractive surgery Right ~ 02/07/2013    "to clear a couple clouds off" (02/14/2013)  . Tee without cardioversion N/A 03/08/2013    Procedure: TRANSESOPHAGEAL ECHOCARDIOGRAM (TEE);  Surgeon: Clinton Records, MD;  Location: [facility] ENDOSCOPY;  Service: Cardiovascular;  Laterality: N/A;  . Permanent pacemaker insertion N/A 08/14/2013    Procedure: PERMANENT PACEMAKER INSERTION;  Surgeon: Clinton Mark, MD;  Location: [place] CATH LAB;  Service: Cardiovascular;  Laterality: N/A;   Family History  Problem Relation Age of Onset  . Thyroid disease Sister   . Thyroid disease Other   . Epilepsy Grandchild   . Sleep walking Daughter    History  Substance Use Topics  .  Smoking status: Former Smoker -- 0.25 packs/day for 6 years    Types: Cigarettes  . Smokeless tobacco: Never Used     Comment: 02/14/2013 "quit smoking ~ 1985"  . Alcohol Use: 0.0 oz/week     Comment: occas    Review of Systems  Constitutional: Negative for fever.  Respiratory: Negative for shortness of breath.   Cardiovascular: Negative for chest pain.  Gastrointestinal: Negative for abdominal pain.  Neurological: Positive for speech difficulty. Negative for dizziness, syncope  and headaches.  All other systems reviewed and are negative.     Allergies  Review of patient's allergies indicates no known allergies.  Home Medications   Prior to Admission medications   Medication Sig Start Date End Date Taking? Authorizing Provider  clopidogrel (PLAVIX) 75 MG tablet TAKE 1 TABLET BY MOUTH EVERY DAY 06/02/14  Yes Clinton Dohmeier, MD  fluticasone (FLONASE) 50 MCG/ACT nasal spray Place 2 sprays into both nostrils as needed. 11/05/13  Yes Historical Provider, MD  metoprolol (LOPRESSOR) 50 MG tablet Take 50 mg by mouth daily.    Yes Historical Provider, MD  montelukast (SINGULAIR) 10 MG tablet Take 1 tablet by mouth every evening. 12/01/13  Yes Historical Provider, MD  omeprazole (PRILOSEC) 40 MG capsule Take 40 mg by mouth daily.   Yes Historical Provider, MD  rosuvastatin (CRESTOR) 20 MG tablet Take 20 mg by mouth daily.   Yes Historical Provider, MD  valsartan (DIOVAN) 160 MG tablet Take 80 mg by mouth daily.    Yes Historical Provider, MD   Pulse 95  Temp(Src) 97.9 F (36.6 C) (Oral)  Ht 5' 8.9" (1.75 m)  Wt 214 lb (97.07 kg)  BMI 31.70 kg/m2  SpO2 98% Physical Exam  Constitutional: He is oriented to person, place, and time. He appears well-developed and well-nourished. No distress.  HENT:  Head: Normocephalic and atraumatic.  Mouth/Throat: Oropharynx is clear and moist. No oropharyngeal exudate.  Moist mucous membranes. No exudate.   Eyes: EOM are normal. Pupils are equal, round, and reactive to light.  Bilateral lens implants  Neck: Normal range of motion. Neck supple. No JVD present.  Cardiovascular: Normal rate, regular rhythm and normal heart sounds.  Exam reveals no gallop and no friction rub.   No murmur heard. Pulmonary/Chest: Effort normal and breath sounds normal. No respiratory distress. He has no wheezes. He has no rales.  Abdominal: Soft. Bowel sounds are normal. He exhibits no mass. There is no tenderness. There is no rebound and no guarding.   Musculoskeletal: Normal range of motion. He exhibits no edema.  Moves all extremities normally.   Lymphadenopathy:    He has no cervical adenopathy.  Neurological: He is alert and oriented to person, place, and time. He has normal reflexes. He displays normal reflexes. A cranial nerve deficit is present. He exhibits normal muscle tone. Coordination normal.  No pronator drift. BUE strength is 5/5. DTRs normal. Very mild dysarthria.   Skin: Skin is warm and dry. No rash noted.  Psychiatric: He has a normal mood and affect. His behavior is normal.  Nursing note and vitals reviewed.   ED Course  Procedures   DIAGNOSTIC STUDIES:   COORDINATION OF CARE: 11:05 PM Discussed treatment plan with pt at bedside and pt agreed to plan.   Labs Review Labs Reviewed  I-STAT CHEM 8, ED - Abnormal; Notable for the following:    Sodium 129 (*)    Chloride 96 (*)    Glucose, Bld 119 (*)    All other  components within normal limits  CBG MONITORING, ED - Abnormal; Notable for the following:    Glucose-Capillary 110 (*)    All other components within normal limits  ETHANOL  PROTIME-INR  APTT  CBC  DIFFERENTIAL  COMPREHENSIVE METABOLIC PANEL  URINE RAPID DRUG SCREEN (HOSP PERFORMED)  URINALYSIS, ROUTINE W REFLEX MICROSCOPIC  I-STAT TROPOININ, ED    Imaging Review Ct Head Wo Contrast  09/18/2014   CLINICAL DATA:  Aphasia.  EXAM: CT HEAD WITHOUT CONTRAST  TECHNIQUE: Contiguous axial images were obtained from the base of the skull through the vertex without intravenous contrast.  COMPARISON:  MRI 02/15/2013  FINDINGS: There is no intracranial hemorrhage, mass or evidence of acute infarction. There is moderate generalized atrophy. There is mild periventricular hypodensity which may represent chronic small vessel disease. The appearances are similar to the MRI of 02/15/2013. No superimposed acute finding is evident. Bones are intact, with mild chronic maxillary sinus disease which is also unchanged  from 02/15/2013.  IMPRESSION: Moderate atrophy and mild chronic small vessel changes. No acute intracranial findings. These results were called by telephone at the time of interpretation on 09/18/2014 at 10:46 pm to Dr. Milton Hughes , who verbally acknowledged these results.   Electronically Signed   By: Clinton Hughes M.D.   On: 09/18/2014 22:48     EKG Interpretation None      MDM   Final diagnoses:  None   Seen by neurology will admit to medicine.     I personally performed the services described in this documentation, which was scribed in my presence. The recorded information has been reviewed and is accurate.       Clinton Kells, MD 09/18/14 2317  Clinton Milliron, MD 09/18/14 2320

## 2014-09-18 NOTE — Consult Note (Signed)
Referring Physician: ED    Chief Complaint: code stroke, aphasia, decreased responsiveness  HPI:                                                                                                                                         Clinton Hughes is an 81 y.o. male with a past medical history that is significant for HTN, hypercholesterolemia, CAD a/p CABG, PFO, TIA versus seizure with speech arrest, s/p pacemaker placement, recent syncope, brought in by EMS for further evaluation of the above stated symptoms. Wife is at the bedside and tells me that they were home watching a movie when suddenly he had a blank stare, manifested that he was having trouble following the storyline, and then he started having difficulty expressing himself and not acting right. He said that he couldn't articulate well and say what he wanted to say. Wife did not notice face-arm-legs weakness, and he denied HA, vertigo, double vision, or visual impairment. Brought to ED where he had initial NIHSS 2 (exclusively language). CT brain was personally reviewed and showed no acute abnormality. Patient is on daily plavix. It is noteworthy that wife expressed that patient had had at least 3 or 4 similar episodes within the past 2 years, and he will gradually recover. Extensive outpatient neurological work up at [facility] neurology was unrevealing.  Date last known well: 09/18/14 Time last known well: 8:30 pm tPA Given: no, rapidly improving NIHSS: 2  MRS: 0  Past Medical History  Diagnosis Date  . Coronary artery disease   . Movement disorder   . H/O hiatal hernia   . High cholesterol   . Pneumonia 2014 X1  . Pneumonia and influenza 2013 X2  . GERD (gastroesophageal reflux disease)   . TIA (transient ischemic attack) 03/02/2013    Spelled of speech arrest during hospitalization in October of this year. Dr. that he was consult and in the hospital. Dr. Wynonia Lawman just applied a cardiac monitor and performed an echocardiogram.  Final approval of proximal stenosis has been seen. Patient here today to followup after hospitalization and more presumed TIAs with speech arrest numbness of her hands lips right side of the face.4  events i  . Lumbar disc disease   . PFO (patent foramen ovale)   . Hypertensive heart disease   . Stroke   . Seizures     Past Surgical History  Procedure Laterality Date  . Colonoscopy  2006/2008  . Lumbar laminectomy  1980    "L5?" (02/14/2013)  . Cataract extraction w/ intraocular lens  implant, bilateral Bilateral 2007-2010  . Pacemaker insertion  08/14/13    MDT Adapta L implanted by Dr Rayann Heman for complete heart blcok  . Back surgery    . Coronary artery bypass graft  1995    "CABG X5" (02/14/2013)  . Refractive surgery Right ~ 02/07/2013    "to clear a couple clouds  off" (02/14/2013)  . Tee without cardioversion N/A 03/08/2013    Procedure: TRANSESOPHAGEAL ECHOCARDIOGRAM (TEE);  Surgeon: Fay Records, MD;  Location: [facility] ENDOSCOPY;  Service: Cardiovascular;  Laterality: N/A;  . Permanent pacemaker insertion N/A 08/14/2013    Procedure: PERMANENT PACEMAKER INSERTION;  Surgeon: Coralyn Mark, MD;  Location: [place] CATH LAB;  Service: Cardiovascular;  Laterality: N/A;    Family History  Problem Relation Age of Onset  . Thyroid disease Sister   . Thyroid disease Other   . Epilepsy Grandchild   . Sleep walking Daughter    Social History:  reports that he has quit smoking. His smoking use included Cigarettes. He has a 1.5 pack-year smoking history. He has never used smokeless tobacco. He reports that he drinks alcohol. He reports that he does not use illicit drugs. Family history: no epilepsy, MS, or brain tumors Allergies: No Known Allergies  Medications:                                                                                                                           I have reviewed the patient's current medications.  ROS:                                                                                                                                        History obtained from chart review, wife, and patient.  General ROS: negative for - chills, fatigue, fever, night sweats, weight gain or weight loss Psychological ROS: negative for - behavioral disorder, hallucination, mood swings or suicidal ideation. Significant for short term memory loss. Ophthalmic ROS: negative for - blurry vision, double vision, eye pain or loss of vision ENT ROS: negative for - epistaxis, nasal discharge, oral lesions, sore throat, tinnitus or vertigo Allergy and Immunology ROS: negative for - hives or itchy/watery eyes Hematological and Lymphatic ROS: negative for - bleeding problems, bruising or swollen lymph nodes Endocrine ROS: negative for - galactorrhea, hair pattern changes, polydipsia/polyuria or temperature intolerance Respiratory ROS: negative for - cough, hemoptysis, shortness of breath or wheezing Cardiovascular ROS: negative for - chest pain, dyspnea on exertion, edema or irregular heartbeat Gastrointestinal ROS: negative for - abdominal pain, diarrhea, hematemesis, nausea/vomiting or stool incontinence Genito-Urinary ROS: negative for - dysuria, hematuria, incontinence or urinary frequency/urgency Musculoskeletal ROS: negative for - joint swelling or muscular weakness Neurological ROS: as noted in HPI Dermatological ROS:  negative for rash and skin lesion changes   Physical exam: pleasant male in no apparent distress. Pulse 95, temperature 97.9 F (36.6 C), temperature source Oral, height 5' 8.9" (1.75 m), weight 97.07 kg (214 lb), SpO2 98 %. Head: normocephalic. Neck: supple, no bruits, no JVD. Cardiac: no murmurs. Lungs: clear. Abdomen: soft, no tender, no mass. Extremities: no edema, clubbing, or cyanosis. Skin: no rash Neurologic Examination:                                                                                                      General: Mental Status: Alert,  oriented, thought content appropriate. Comprehension is intact but mild expressive dysaphasia.  Cranial Nerves: II: Discs flat bilaterally; Visual fields grossly normal, pupils equal, round, reactive to light and accommodation III,IV, VI: ptosis not present, extra-ocular motions intact bilaterally V,VII: smile symmetric, facial light touch sensation normal bilaterally VIII: hearing normal bilaterally IX,X: uvula rises symmetrically XI: bilateral shoulder shrug XII: midline tongue extension without atrophy or fasciculations Motor: 5/5 all over Sensory: intact all modalities. DTR's: 2 all over Plantars: Right: downgoing   Left: downgoing Cerebellar: normal finger-to-nose,  normal heel-to-shin test Gait:  No tested due to multiple leads  Results for orders placed or performed during the hospital encounter of 09/18/14 (from the past 48 hour(s))  I-stat troponin, ED (not at [facility])     Status: None   Collection Time: 09/18/14 10:29 PM  Result Value Ref Range   Troponin i, poc 0.01 0.00 - 0.08 ng/mL   Comment 3            Comment: Due to the release kinetics of cTnI, a negative result within the first hours of the onset of symptoms does not rule out myocardial infarction with certainty. If myocardial infarction is still suspected, repeat the test at appropriate intervals.   I-Stat Chem 8, ED  (not at [facility])     Status: Abnormal   Collection Time: 09/18/14 10:30 PM  Result Value Ref Range   Sodium 129 (L) 135 - 145 mmol/L   Potassium 4.3 3.5 - 5.1 mmol/L   Chloride 96 (L) 101 - 111 mmol/L   BUN 17 6 - 20 mg/dL   Creatinine, Ser 1.00 0.61 - 1.24 mg/dL   Glucose, Bld 119 (H) 70 - 99 mg/dL   Calcium, Ion 1.15 1.13 - 1.30 mmol/L   TCO2 20 0 - 100 mmol/L   Hemoglobin 13.3 13.0 - 17.0 g/dL   HCT 39.0 39.0 - 52.0 %  CBG monitoring, ED     Status: Abnormal   Collection Time: 09/18/14 10:42 PM  Result Value Ref Range   Glucose-Capillary 110 (H) 70 - 99 mg/dL   Ct Head Wo  Contrast  09/18/2014   CLINICAL DATA:  Aphasia.  EXAM: CT HEAD WITHOUT CONTRAST  TECHNIQUE: Contiguous axial images were obtained from the base of the skull through the vertex without intravenous contrast.  COMPARISON:  MRI 02/15/2013  FINDINGS: There is no intracranial hemorrhage, mass or evidence of acute infarction. There is moderate generalized atrophy. There is mild periventricular  hypodensity which may represent chronic small vessel disease. The appearances are similar to the MRI of 02/15/2013. No superimposed acute finding is evident. Bones are intact, with mild chronic maxillary sinus disease which is also unchanged from 02/15/2013.  IMPRESSION: Moderate atrophy and mild chronic small vessel changes. No acute intracranial findings. These results were called by telephone at the time of interpretation on 09/18/2014 at 10:46 pm to Dr. Milton Ferguson , who verbally acknowledged these results.   Electronically Signed   By: Andreas Newport M.D.   On: 09/18/2014 22:48    Assessment: 81 y.o. male brought in as a code stroke due to acute onset expressive aphasia, decreased responsiveness. NIHSS 2 on initial exam but rapidly improving. CT brain without acute abnormality. Patient had had similar episodes in the past which typically resolved within 45 to 60 minutes. Differential includes left brain TIA versus ictal dysphasia. Patient was within the window for IV thrombolysis but it was not administered due to remarkably clinical improvement in the ED and possibility of this episode being a seizure with speech arrest and decreased responsiveness. Unfortunately, he can not have MRI due to pacemaker. Get follow up CT brain tomorrow, CUS, lipid profile, TTE, and EEG. Continue plavix for now. Stroke team will follow up tomorrow.   Stroke Risk Factors - age,  HTN, hypercholesterolemia, CAD, TIA   Plan: 1. HgbA1c, fasting lipid panel 2. MRI, MRA  of the brain without contrast 3. Echocardiogram 4. Carotid  dopplers 5. Prophylactic therapy-plavix after passing swallowing eval 6. Risk factor modification 7. Telemetry monitoring 8. Frequent neuro checks 9. PT/OT SLP  Dorian Pod, MD [facility] [phone]  09/18/2014, 11:02 PM

## 2014-09-19 ENCOUNTER — Inpatient Hospital Stay (HOSPITAL_COMMUNITY): Payer: Medicare Other

## 2014-09-19 ENCOUNTER — Encounter (HOSPITAL_COMMUNITY): Payer: Self-pay | Admitting: Nurse Practitioner

## 2014-09-19 DIAGNOSIS — G459 Transient cerebral ischemic attack, unspecified: Principal | ICD-10-CM

## 2014-09-19 DIAGNOSIS — F1010 Alcohol abuse, uncomplicated: Secondary | ICD-10-CM

## 2014-09-19 DIAGNOSIS — Z8673 Personal history of transient ischemic attack (TIA), and cerebral infarction without residual deficits: Secondary | ICD-10-CM | POA: Insufficient documentation

## 2014-09-19 DIAGNOSIS — E871 Hypo-osmolality and hyponatremia: Secondary | ICD-10-CM

## 2014-09-19 LAB — APTT: aPTT: 30 seconds (ref 24–37)

## 2014-09-19 LAB — DIFFERENTIAL
BASOS PCT: 1 % (ref 0–1)
Basophils Absolute: 0.1 10*3/uL (ref 0.0–0.1)
EOS PCT: 2 % (ref 0–5)
Eosinophils Absolute: 0.3 10*3/uL (ref 0.0–0.7)
Lymphocytes Relative: 17 % (ref 12–46)
Lymphs Abs: 1.7 10*3/uL (ref 0.7–4.0)
MONO ABS: 1.1 10*3/uL — AB (ref 0.1–1.0)
Monocytes Relative: 11 % (ref 3–12)
Neutro Abs: 7.1 10*3/uL (ref 1.7–7.7)
Neutrophils Relative %: 69 % (ref 43–77)

## 2014-09-19 LAB — RAPID URINE DRUG SCREEN, HOSP PERFORMED
Amphetamines: NOT DETECTED
BENZODIAZEPINES: NOT DETECTED
Barbiturates: NOT DETECTED
Cocaine: NOT DETECTED
OPIATES: NOT DETECTED
TETRAHYDROCANNABINOL: NOT DETECTED

## 2014-09-19 LAB — COMPREHENSIVE METABOLIC PANEL
ALT: 26 U/L (ref 17–63)
ANION GAP: 11 (ref 5–15)
AST: 36 U/L (ref 15–41)
Albumin: 4 g/dL (ref 3.5–5.0)
Alkaline Phosphatase: 104 U/L (ref 38–126)
BUN: 16 mg/dL (ref 6–20)
CO2: 22 mmol/L (ref 22–32)
Calcium: 9.2 mg/dL (ref 8.9–10.3)
Chloride: 95 mmol/L — ABNORMAL LOW (ref 101–111)
Creatinine, Ser: 1 mg/dL (ref 0.61–1.24)
GFR calc non Af Amer: >60 mL/min (ref >60)
Glucose, Bld: 127 mg/dL — ABNORMAL HIGH (ref 70–99)
POTASSIUM: 4.3 mmol/L (ref 3.5–5.1)
SODIUM: 128 mmol/L — AB (ref 135–145)
TOTAL PROTEIN: 6.6 g/dL (ref 6.5–8.1)
Total Bilirubin: 0.8 mg/dL (ref 0.3–1.2)

## 2014-09-19 LAB — CBC
HEMATOCRIT: 36.6 % — AB (ref 39.0–52.0)
Hemoglobin: 12.9 g/dL — ABNORMAL LOW (ref 13.0–17.0)
MCH: 33.6 pg (ref 26.0–34.0)
MCHC: 35.2 g/dL (ref 30.0–36.0)
MCV: 95.3 fL (ref 78.0–100.0)
Platelets: 254 10*3/uL (ref 150–400)
RBC: 3.84 MIL/uL — AB (ref 4.22–5.81)
RDW: 12.2 % (ref 11.5–15.5)
WBC: 10.3 10*3/uL (ref 4.0–10.5)

## 2014-09-19 LAB — URINALYSIS, ROUTINE W REFLEX MICROSCOPIC
Bilirubin Urine: NEGATIVE
GLUCOSE, UA: NEGATIVE mg/dL
HGB URINE DIPSTICK: NEGATIVE
KETONES UR: 15 mg/dL — AB
LEUKOCYTES UA: NEGATIVE
Nitrite: NEGATIVE
PH: 6 (ref 5.0–8.0)
Protein, ur: NEGATIVE mg/dL
Specific Gravity, Urine: 1.021 (ref 1.005–1.030)
Urobilinogen, UA: 1 mg/dL (ref 0.0–1.0)

## 2014-09-19 LAB — LIPID PANEL
Cholesterol: 127 mg/dL (ref 0–200)
Cholesterol: 109 mg/dL (ref 0–200)
HDL: 52 mg/dL (ref >40)
HDL: 56 mg/dL (ref >40)
LDL CALC: 45 mg/dL (ref 0–99)
LDL CALC: 50 mg/dL (ref 0–99)
TRIGLYCERIDES: 58 mg/dL (ref <150)
Total CHOL/HDL Ratio: 2.3 RATIO
Total CHOL/HDL Ratio: 2.1 RATIO
Triglycerides: 104 mg/dL (ref <150)
VLDL: 21 mg/dL (ref 0–40)
VLDL: 12 mg/dL (ref 0–40)

## 2014-09-19 LAB — PROTIME-INR
INR: 1.03 (ref 0.00–1.49)
Prothrombin Time: 13.6 seconds (ref 11.6–15.2)

## 2014-09-19 LAB — ETHANOL

## 2014-09-19 LAB — GLUCOSE, CAPILLARY: Glucose-Capillary: 109 mg/dL — ABNORMAL HIGH (ref 70–99)

## 2014-09-19 NOTE — Procedures (Signed)
ELECTROENCEPHALOGRAM REPORT   Patient: Clinton Hughes      Room #: 1W-29 Age: 81 y.o.        Sex: male Referring Physician: Dr Aram Beecham Report Date:  09/19/2014        Interpreting Physician: Hulen Luster  History: Clinton Hughes is an 81 y.o. male presenting with transient speech deficits  Medications:  Scheduled: . clopidogrel  75 mg Oral Daily  . heparin  5,000 Units Subcutaneous 3 times per day  . irbesartan  150 mg Oral Daily  . metoprolol  50 mg Oral Daily  . montelukast  10 mg Oral QPM  . pantoprazole  80 mg Oral Daily  . rosuvastatin  20 mg Oral Daily    Conditions of Recording:  This is a 16 channel EEG carried out with the patient in the drowsy state.  Description:  The waking background activity consists of a low voltage, symmetrical, fairly well organized, mixture of theta and alpha activity in the 7-8Hz  range, seen from the parieto-occipital and posterior temporal regions.  Low voltage fast activity, poorly organized, is seen anteriorly and is at times superimposed on more posterior regions.  A mixture of theta and alpha rhythms are seen from the central and temporal regions. No focal slowing or epileptiform activity is noted.   The patient drowses with slowing to irregular, low voltage theta and beta activity.  Normal sleep architecture is not observed. Hyperventilation and intermittent photic stimulation was not performed.    IMPRESSION: Normal drowsy electroencephalogram. There are no focal lateralizing or epileptiform features.    Jim Like, DO [facility] [phone]  If 7pm- 7am, please page neurology on call as listed in AMION. 09/19/2014, 11:36 AM

## 2014-09-19 NOTE — Progress Notes (Signed)
Occupational Therapy Evaluation Patient Details Name: Clinton Hughes MRN: [id number] DOB: [birth year] Today's Date: 09/19/2014    History of Present Illness 81 y.o. male admitted for sudden onset of aphasia, possible,TIA - DDX include seizure..   Clinical Impression   PTa, pt independent with ADL and mobility. Has been using RW for a few days due to recent back injury. Pt appears close to baseline. No further OT indicated. Pt safe to D/C home with intermittent S when medically stable.    Follow Up Recommendations  No OT follow up;Supervision - Intermittent    Equipment Recommendations  None recommended by OT    Recommendations for Other Services       Precautions / Restrictions Precautions Precautions: Fall Restrictions Weight Bearing Restrictions: No      Mobility Bed Mobility Overal bed mobility: Modified Independent                Transfers Overall transfer level: Needs assistance Equipment used: None   Sit to Stand: S            Balance     Sitting balance-Leahy Scale: Good       Standing balance-Leahy Scale: Fair                              ADL Overall ADL's : Needs assistance/impaired                                     Functional mobility during ADLs: Supervision/safety;Rolling walker;Cueing for safety General ADL Comments: Overall S for safety; Rec pt use shower chair for bathing to reduce risk of falls.     Vision Vision Assessment?: No apparent visual deficits   Perception     Praxis Praxis Praxis tested?: Within functional limits    Pertinent Vitals/Pain Pain Assessment: No/denies pain     Hand Dominance Right   Extremity/Trunk Assessment Upper Extremity Assessment Upper Extremity Assessment: Overall WFL for tasks assessed   Lower Extremity Assessment Lower Extremity Assessment: Overall WFL for tasks assessed   Cervical / Trunk Assessment Cervical / Trunk Assessment: Normal    Communication Communication Communication: No difficulties (admitted with dysphasia)   Cognition Arousal/Alertness: Awake/alert Behavior During Therapy: WFL for tasks assessed/performed Overall Cognitive Status: Within Functional Limits for tasks assessed                     General Comments       Exercises       Shoulder Instructions      Home Living Family/patient expects to be discharged to:: Private residence Living Arrangements: Spouse/significant other Available Help at Discharge: Family;Available 24 hours/day Type of Home: House Home Access: Stairs to enter [employer] of Steps: 3 Entrance Stairs-Rails: Right;Left Home Layout: Two level Alternate Level Stairs-Number of Steps: 15 Alternate Level Stairs-Rails: Right;Left Bathroom Shower/Tub: Walk-in shower;Tub/shower unit   Bathroom Toilet: Standard Bathroom Accessibility: Yes How Accessible: Accessible via walker Home Equipment: [place] - 2 wheels;Cane - single point;Shower seat      Lives With: Spouse    Prior Functioning/Environment Level of Independence: Independent with assistive device(s)        Comments: Using RW as of 2 days ago    OT Diagnosis:     OT Problem List:     OT Treatment/Interventions:      OT Goals(Current goals can be found in  the care plan section) Acute Rehab OT Goals Patient Stated Goal: Go home OT Goal Formulation: All assessment and education complete, DC therapy  OT Frequency:     Barriers to D/C:            Co-evaluation              End of Session Equipment Utilized During Treatment: Rolling walker Nurse Communication: Mobility status  Activity Tolerance: Patient tolerated treatment well Patient left: in chair;with call bell/phone within reach;with chair alarm set   Time: [phone] OT Time Calculation (min): 16 min Charges:  OT General Charges $OT Visit: 1 Procedure OT Evaluation $Initial OT Evaluation Tier I: 1 Procedure G-Codes:     Alexie Samson,HILLARY 2014/10/11, 4:31 PM   [facility], OTR/L  [phone] 2014-10-11

## 2014-09-19 NOTE — Progress Notes (Signed)
STROKE TEAM PROGRESS NOTE   HISTORY Clinton Hughes is an 81 y.o. male with a past medical history that is significant for HTN, hypercholesterolemia, CAD a/p CABG, PFO, TIA versus seizure with speech arrest, s/p pacemaker placement, recent syncope, brought in by EMS for further evaluation of aphasia, decreased responsiveness. Wife is at the bedside and tells me that they were home watching a movie when suddenly he had a blank stare, manifested that he was having trouble following the storyline, and then he started having difficulty expressing himself and not acting right. He said that he couldn't articulate well and say what he wanted to say. Wife did not notice face-arm-legs weakness, and he denied HA, vertigo, double vision, or visual impairment. Brought to ED where he had initial NIHSS 2 (exclusively language). CT brain was personally reviewed and showed no acute abnormality. Patient is on daily plavix. It is noteworthy that wife expressed that patient had had at least 3 or 4 similar episodes within the past 2 years, and he will gradually recover. Extensive outpatient neurological work up at [facility] neurology was unrevealing. Patient was last known well 09/18/14 at 8:30 pm. NIHSS: 2 MRS: 0 Patient was not administered TPA secondary to rapidly improving. He was admitted for further evaluation and treatment.   SUBJECTIVE (INTERVAL HISTORY) His wife is at the bedside.  Overall he feels his condition is stable. He is sitting up in the chair.   OBJECTIVE Temp:  [97.3 F (36.3 C)-98.2 F (36.8 C)] 97.3 F (36.3 C) (05/05 0900) Pulse Rate:  [70-97] 78 (05/05 0900) Cardiac Rhythm:  [-] Ventricular paced (05/05 0100) Resp:  [15-20] 18 (05/05 0900) BP: (129-202)/(53-90) 175/53 mmHg (05/05 0900) SpO2:  [91 %-99 %] 99 % (05/05 0900) Weight:  [97.07 kg (214 lb)] 97.07 kg (214 lb) (05/04 2200)   Recent Labs Lab 09/18/14 2242  GLUCAP 110*    Recent Labs Lab 09/18/14 2230 09/18/14 2340  NA 129*  128*  K 4.3 4.3  CL 96* 95*  CO2  --  22  GLUCOSE 119* 127*  BUN 17 16  CREATININE 1.00 1.00  CALCIUM  --  9.2    Recent Labs Lab 09/18/14 2340  AST 36  ALT 26  ALKPHOS 104  BILITOT 0.8  PROT 6.6  ALBUMIN 4.0    Recent Labs Lab 09/18/14 2230 09/18/14 2340  WBC  --  10.3  NEUTROABS  --  7.1  HGB 13.3 12.9*  HCT 39.0 36.6*  MCV  --  95.3  PLT  --  254   No results for input(s): CKTOTAL, CKMB, CKMBINDEX, TROPONINI in the last 168 hours.  Recent Labs  09/18/14 2340  LABPROT 13.6  INR 1.03    Recent Labs  09/18/14 2325  COLORURINE YELLOW  LABSPEC 1.021  PHURINE 6.0  GLUCOSEU NEGATIVE  HGBUR NEGATIVE  BILIRUBINUR NEGATIVE  KETONESUR 15*  PROTEINUR NEGATIVE  UROBILINOGEN 1.0  NITRITE NEGATIVE  LEUKOCYTESUR NEGATIVE       Component Value Date/Time   CHOL 109 09/19/2014 0523   TRIG 58 09/19/2014 0523   HDL 52 09/19/2014 0523   CHOLHDL 2.1 09/19/2014 0523   VLDL 12 09/19/2014 0523   LDLCALC 45 09/19/2014 0523   Lab Results  Component Value Date   HGBA1C 5.6 02/14/2013      Component Value Date/Time   LABOPIA NONE DETECTED 09/18/2014 2325   COCAINSCRNUR NONE DETECTED 09/18/2014 2325   LABBENZ NONE DETECTED 09/18/2014 2325   AMPHETMU NONE DETECTED 09/18/2014 2325   THCU  NONE DETECTED 09/18/2014 2325   LABBARB NONE DETECTED 09/18/2014 2325     Recent Labs Lab 09/18/14 2340  ETH <5    Ct Head Wo Contrast  09/18/2014   CLINICAL DATA:  Aphasia.  EXAM: CT HEAD WITHOUT CONTRAST  TECHNIQUE: Contiguous axial images were obtained from the base of the skull through the vertex without intravenous contrast.  COMPARISON:  MRI 02/15/2013  FINDINGS: There is no intracranial hemorrhage, mass or evidence of acute infarction. There is moderate generalized atrophy. There is mild periventricular hypodensity which may represent chronic small vessel disease. The appearances are similar to the MRI of 02/15/2013. No superimposed acute finding is evident. Bones are  intact, with mild chronic maxillary sinus disease which is also unchanged from 02/15/2013.  IMPRESSION: Moderate atrophy and mild chronic small vessel changes. No acute intracranial findings. These results were called by telephone at the time of interpretation on 09/18/2014 at 10:46 pm to Dr. Milton Ferguson , who verbally acknowledged these results.   Electronically Signed   By: Andreas Newport M.D.   On: 09/18/2014 22:48   Carotid Doppler  1-39% internal carotid artery stenosis bilaterally. The right vertebral artery is patent with antegrade flow. Unable to visualize the left vertebral artery.  2D Echocardiogram   - Left ventricle: The cavity size was normal. Wall thickness wasincreased increased in a pattern of mild to moderate LVH.Systolic function was normal. The estimated ejection fraction wasin the range of 55% to 60%. Wall motion was normal; there were noregional wall motion abnormalities. Doppler parameters areconsistent with abnormal left ventricular relaxation (grade 1diastolic dysfunction). - Mitral valve: Mildly calcified annulus. There was moderateregurgitation. - Left atrium: The atrium was mildly to moderately dilated. - Pulmonary arteries: Systolic pressure was mildly increased. PApeak pressure: 35 mm Hg (S).  EEG Normal drowsy electroencephalogram. There are no focal lateralizing or epileptiform features.     PHYSICAL EXAM Pleasant elderly Caucasian male not in distress. . Afebrile. Head is nontraumatic. Neck is supple without bruit.    Cardiac exam no murmur or gallop. Lungs are clear to auscultation. Distal pulses are well felt. Neurological Exam ;  Awake  Alert oriented x 3. Normal speech and language.eye movements full without nystagmus.fundi were not visualized. Vision acuity and fields appear normal. Hearing is normal. Palatal movements are normal. Face symmetric. Tongue midline. Normal strength, tone, reflexes and coordination. Normal sensation. Gait  deferred. ASSESSMENT/PLAN Clinton Hughes is a 81 y.o. male with history of HTN, hypercholesterolemia, CAD a/p CABG, PFO, TIA versus seizure with speech arrest, s/p pacemaker placement, recent syncope presenting with aphasia, decreased responsiveness. He did not receive IV t-PA due to rapidly improving symptoms.   Possible Stroke/TIA:  Workup underway  Resultant  Neuro deficits resolved  MRI  / MRA  Not able to do d/t device  CTA head in am  Carotid Doppler  No significant stenosis   2D Echo  No source of embolus   EEG  Neg for seizure. 2 EEGS in past also neg  Called [facility] to interrogate device to look for atrial fibrillation as source of stroke  Has had negative 30 day cardiac monitor in the past 2 years  HgbA1c pending  Heparin 5000 units sq tid for VTE prophylaxis  Diet Heart Room service appropriate?: Yes; Fluid consistency:: Thin  clopidogrel 75 mg orally every day prior to admission, now on clopidogrel 75 mg orally every day  Patient counseled to be compliant with his antithrombotic medications  Ongoing aggressive stroke risk factor management  Therapy recommendations:  No ST needs, HH PT recommended  Disposition:  Home with wife with OP vs HH therapies  Hypertension  Stable  Hyperlipidemia  Home meds:  crestor 20, resumed in hospital  LDL 45, at goal < 70  Continue statin at discharge  Other Stroke Risk Factors  Advanced age  Former Cigarette smoker, quit smoking   ETOH use  Obesity, Body mass index is 31.7 kg/(m^2).   Presumed Hx TIA 2014 with speech arrest, not confirmed it was indeed a TIA. No history of stroke. Removed from history section of chart.  Coronary artery disease - CABG 21 years  Known PFO  Other Active Problems  Movement disorder  Hospital day # [place] for Pager information 09/19/2014 4:02 PM  I have personally examined this patient, reviewed notes,  independently viewed imaging studies, participated in medical decision making and plan of care. I have made any additions or clarifications directly to the above note. Agree with note above. He presented with transient episode of expressive speech difficulties and possibly brief altered sensorium possible to include TIA versus simple partial seizure. He remains at risk for neurological worsening, stroke, TIA and seizures and needs ongoing evaluation and aggressive risk factor control. Check EEG and CT angiogram. Review prior workup during prior admission for similar episode. Long discussion with patient and wife at the bedside and answered questions.  Antony Contras, MD Medical Director [facility] Pager: [phone] 09/19/2014 5:15 PM    To contact Stroke Continuity provider, please refer to [email or web address] After hours, contact General Neurology

## 2014-09-19 NOTE — Progress Notes (Signed)
  Echocardiogram 2D Echocardiogram has been performed.  Clinton Hughes FRANCES 09/19/2014, 10:50 AM

## 2014-09-19 NOTE — Evaluation (Signed)
Speech Language Pathology Evaluation Patient Details Name: Clinton Hughes MRN: [id number] DOB: Feb 16, 1926 Today's Date: 09/19/2014 Time: 0130-0200 SLP Time Calculation (min) (ACUTE ONLY): 30 min  Problem List:  Patient Active Problem List   Diagnosis Date Noted  . H/O TIA (transient ischemic attack) and stroke   . Complete heart block 12/17/2013  . Sick sinus syndrome 12/17/2013  . Cardiac pacemaker in situ 08/14/2013  . Hyponatremia 03/13/2013  . CAD (coronary artery disease) 03/12/2013  . Hypertensive heart disease 03/12/2013  . Hyperlipidemia 03/12/2013  . Orthostatic hypotension 03/12/2013  . Lumbar disc disease 03/12/2013  . GERD (gastroesophageal reflux disease) 03/12/2013  . PFO (patent foramen ovale) 03/12/2013  . TIA (transient ischemic attack) 03/02/2013  . Alcohol abuse, daily use 02/14/2013  . Parkinsonism 10/11/2012   Past Medical History:  Past Medical History  Diagnosis Date  . Coronary artery disease   . Movement disorder   . H/O hiatal hernia   . High cholesterol   . Pneumonia 2014 X1  . Pneumonia and influenza 2013 X2  . GERD (gastroesophageal reflux disease)   . TIA (transient ischemic attack) 03/02/2013    Spelled of speech arrest during hospitalization in October of this year. Dr. that he was consult and in the hospital. Dr. Wynonia Lawman just applied a cardiac monitor and performed an echocardiogram. Final approval of proximal stenosis has been seen. Patient here today to followup after hospitalization and more presumed TIAs with speech arrest numbness of her hands lips right side of the face.4  events i  . Lumbar disc disease   . PFO (patent foramen ovale)   . Hypertensive heart disease   . Stroke   . Seizures    Past Surgical History:  Past Surgical History  Procedure Laterality Date  . Colonoscopy  2006/2008  . Lumbar laminectomy  1980    "L5?" (02/14/2013)  . Cataract extraction w/ intraocular lens  implant, bilateral Bilateral 2007-2010  .  Pacemaker insertion  08/14/13    MDT Adapta L implanted by Dr Rayann Heman for complete heart blcok  . Back surgery    . Coronary artery bypass graft  1995    "CABG X5" (02/14/2013)  . Refractive surgery Right ~ 02/07/2013    "to clear a couple clouds off" (02/14/2013)  . Tee without cardioversion N/A 03/08/2013    Procedure: TRANSESOPHAGEAL ECHOCARDIOGRAM (TEE);  Surgeon: Fay Records, MD;  Location: [facility] ENDOSCOPY;  Service: Cardiovascular;  Laterality: N/A;  . Permanent pacemaker insertion N/A 08/14/2013    Procedure: PERMANENT PACEMAKER INSERTION;  Surgeon: Coralyn Mark, MD;  Location: [facility] CATH LAB;  Service: Cardiovascular;  Laterality: N/A;   HPI:  Pt is an 81 yo male admitted d/t aphasic symptoms, which have since resolved; Has had 3-4 similar episodes over past 2 years, always recovers, extensive outpatient neurologic work up at [facility] is unrevealing; pmhx significant for CAD, multiple surgeries including laminectomy (L5?), cataract surgery and pacemaker insertion. CT head on 09/18/14 reveals moderate atrophy and mild chronic small vessel changes; CT angio (head) completed on 09/19/14, but results not available.  Pt stated he had a "laser procedure" d/t retinal bleeding and now when reading, words intermittently become "out of focus."     Assessment / Plan / Recommendation Clinical Impression   Pt exhibited speech/language which was within functional limits for age in the areas of verbal expression and auditory comprehension; mild difficulty with recall of paragraph details, but auditory cues given only; pt stated he comprehends better when he is able to read  information by self, but did not have reading glasses available.  Recalled 75% of information via auditory means with minimal cueing from SLP; cognitive tasks appear within functional limits for simple tasks; complex tasks require minimal cueing (i.e.: judgment,planning, executive functioning), but appear within functional limits for age at this time.   Wife notified of speech therapy option prn if changes persist with cognition.     SLP Assessment  Patient does not need any further Speech Language Pathology Services    Follow Up Recommendations  None;Other (comment) (mentioned to wife referral for ST prn)    Frequency and Duration   n/a     Pertinent Vitals/Pain Pain Assessment: No/denies pain   SLP Goals   n/a  SLP Evaluation Prior Functioning  Cognitive/Linguistic Baseline: Baseline deficits Baseline deficit details: wife reports intermittent difficulty with detailed comprehension tasks Type of Home: House  Lives With: Spouse Available Help at Discharge: Family;Available 24 hours/day Education: [employer] degree Vocation: Retired   Associate Professor  Overall Cognitive Status: Within Advertising copywriter for tasks assessed Arousal/Alertness: Awake/alert Orientation Level: Oriented X4 Memory: Appears intact Awareness: Appears intact Problem Solving: Appears intact Safety/Judgment: Appears intact    Comprehension  Auditory Comprehension Overall Auditory Comprehension: Appears within functional limits for tasks assessed Yes/No Questions: Within Functional Limits Commands: Within Functional Limits Conversation: Complex Visual Recognition/Discrimination Discrimination: Not tested Reading Comprehension Reading Status: Not tested    Expression Expression Primary Mode of Expression: Verbal Verbal Expression Overall Verbal Expression: Appears within functional limits for tasks assessed Initiation: No impairment Level of Generative/Spontaneous Verbalization: Conversation Repetition: No impairment Naming: No impairment Pragmatics: No impairment Non-Verbal Means of Communication: Not applicable Written Expression Dominant Hand: Right Written Expression: Not tested   Oral / Motor Oral Motor/Sensory Function Overall Oral Motor/Sensory Function: Appears within functional limits for tasks assessed Motor Speech Overall Motor  Speech: Appears within functional limits for tasks assessed Respiration: Within functional limits Phonation: Normal Resonance: Within functional limits Articulation: Within functional limitis Intelligibility: Intelligible Motor Planning: Witnin functional limits Motor Speech Errors: Not applicable        ADAMS,PAT, M.S., CCC-SLP 09/19/2014, 2:45 PM

## 2014-09-19 NOTE — Progress Notes (Signed)
*  PRELIMINARY RESULTS* Vascular Ultrasound Carotid Duplex (Doppler) has been completed.  Findings suggest 1-39% internal carotid artery stenosis bilaterally. The right vertebral artery is patent with antegrade flow. Unable to visualize the left vertebral artery.  09/19/2014 11:08 AM Maudry Mayhew, RVT, RDCS, RDMS

## 2014-09-19 NOTE — Progress Notes (Signed)
EEG Completed; Results Pending  

## 2014-09-19 NOTE — Progress Notes (Signed)
[facility]                                                                              Patient Demographics  Clinton Clinton Hughes, is a 81 y.o. male, DOB - 14-Oct-1925, GYJ:[id number]  Admit date - 09/18/2014   Admitting Physician Clinton Quill, DO  Outpatient Primary MD for the patient is  Clinton Crutch, MD  LOS - 1   Chief Complaint  Patient presents with  . Code Stroke      HPI on 09/18/2014 by Clinton Clinton Hughes Clinton Clinton Hughes is a 81 y.o. male who is brought in to the ED with c/o sudden onset of aphasia that started at 2100 this evening while watching TV. Apparently per wife, he developed a sudden blank stare, difficulty following storyline, then had difficulty expressing himself and not acting right. He states he couldn't articulate what he wanted to say. Clinton Hughes noticeable face-arm-leg weakness. Brought to ED, initially had expressive aphasia with NIHSS 2. This has rapidly resolved without treatment. Has had 3-4 similar episodes over past 2 years, always recovers, extensive outpatient neurologic work up at [facility] is unrevealing.  Assessment & Plan   Aphasia, suspect TIA vs CVA -CT head: Moderate atrophy and mild chronic small vessel disease, Clinton Hughes acute intracranial finding -Cannot obtain MRI due to pacemaker -EEG: Normal drowsy EEG, Clinton Hughes focal lateralizing or epileptiform features -Echocardiogram: pending -Carotid doppler: 1-39% internal carotid artery stenosis bilaterally, right temporal artery is patent with antegrade flow, unable to visualize the left vertebral artery -LDL 45 -hemoglobin A1c pending -PT consulted and recommended home health physical therapy -OT and speech therapy pending -Neurology consulted and appreciated, recommended interrogation of pacemaker and repeat CT scan -Continue plavix and statin  Hypertension -Continue metoprolol, Avapro  Chronic Hyponatremia -Per patient's wife, sodium was 125 last week, currently 128 -Per hospital records, sodium is  chronically low with an average of approximately 127 since 2014 -will obtain TSH level -Seeing nephrology as an outpatient  Alcohol abuse -Patient currently not having any alcohol withdrawal symptoms -Will continue to monitor closely -Discussed the appropriate amount of alcohol per day  Hyperlipidemia -Lipid panel: TC 109, triglycerides 58, HDL 52, LDL 45 -Continue statin  Code Status: Full  Family Communication: Wife at bedside  Disposition Plan: Admitted, pending echocardiogram, repeat CT scan,  and further recommendations from neurology  Time Spent in minutes   30 minutes  Procedures  Carotid Doppler EEG Echocardiogram  Consults   Neurology  DVT Prophylaxis  heparin  Lab Results  Component Value Date   PLT 254 09/18/2014    Medications  Scheduled Meds: . clopidogrel  75 mg Oral Daily  . heparin  5,000 Units Subcutaneous 3 times per day  . irbesartan  150 mg Oral Daily  . metoprolol  50 mg Oral Daily  . montelukast  10 mg Oral QPM  . pantoprazole  80 mg Oral Daily  . rosuvastatin  20 mg Oral Daily   Continuous Infusions:  PRN Meds:.  Antibiotics    Anti-infectives    None      Subjective:   Clinton Clinton Hughes seen and examined today.  Patient denies any further episodes of speaking difficulties. Wife at bedside  and states it occurred yesterday for approximately 2 hours. Patient denies any current chest pain, headache, dizziness, shortness of breath, abdominal pain.  Objective:   Filed Vitals:   09/19/14 0500 09/19/14 0700 09/19/14 0900 09/19/14 1119  BP: 189/78 170/68 175/53 156/70  Pulse: 80 70 78 79  Temp: 97.7 F (36.5 C) 98.2 F (36.8 C) 97.3 F (36.3 C) 98 F (36.7 C)  TempSrc: Oral Oral Oral Oral  Resp: 16 16 18 18   Height:      Weight:      SpO2: 97% 96% 99% 98%    Wt Readings from Last 3 Encounters:  09/18/14 97.07 kg (214 lb)  12/12/13 89.359 kg (197 lb)  08/15/13 89.9 kg (198 lb 3.1 oz)     Intake/Output Summary (Last 24  hours) at 09/19/14 1342 Last data filed at 09/19/14 0800  Gross per 24 hour  Intake    240 ml  Output      0 ml  Net    240 ml    Exam  General: Well developed, well nourished, NAD, appears stated age  HEENT: NCAT, PERRLA, EOMI, Anicteic Sclera, mucous membranes moist.   Cardiovascular: S1 S2 auscultated, Clinton Hughes rubs, murmurs or gallops. Regular rate and rhythm.  Respiratory: Clear to auscultation bilaterally with equal chest rise  Abdomen: Soft, nontender, nondistended, + bowel sounds  Extremities: warm dry without cyanosis clubbing or edema  Neuro: AAOx3, cranial nerves grossly intact. Strength 5/5 in patient's upper and lower extremities bilaterally  Psych: Normal affect and demeanor with intact judgement and insight  Data Review   Micro Results Clinton Hughes results found for this or any previous visit (from the past 240 hour(s)).  Radiology Reports Ct Head Wo Contrast  09/18/2014   CLINICAL DATA:  Aphasia.  EXAM: CT HEAD WITHOUT CONTRAST  TECHNIQUE: Contiguous axial images were obtained from the base of the skull through the vertex without intravenous contrast.  COMPARISON:  MRI 02/15/2013  FINDINGS: There is Clinton Hughes intracranial hemorrhage, mass or evidence of acute infarction. There is moderate generalized atrophy. There is mild periventricular hypodensity which may represent chronic small vessel disease. The appearances are similar to the MRI of 02/15/2013. Clinton Hughes superimposed acute finding is evident. Bones are intact, with mild chronic maxillary sinus disease which is also unchanged from 02/15/2013.  IMPRESSION: Moderate atrophy and mild chronic small vessel changes. Clinton Hughes acute intracranial findings. These results were called by telephone at the time of interpretation on 09/18/2014 at 10:46 pm to Clinton Clinton Hughes , who verbally acknowledged these results.   Electronically Signed   By: Andreas Newport M.D.   On: 09/18/2014 22:48    CBC  Recent Labs Lab 09/18/14 2230 09/18/14 2340  WBC  --   10.3  HGB 13.3 12.9*  HCT 39.0 36.6*  PLT  --  254  MCV  --  95.3  MCH  --  33.6  MCHC  --  35.2  RDW  --  12.2  LYMPHSABS  --  1.7  MONOABS  --  1.1*  EOSABS  --  0.3  BASOSABS  --  0.1    Chemistries   Recent Labs Lab 09/18/14 2230 09/18/14 2340  NA 129* 128*  K 4.3 4.3  CL 96* 95*  CO2  --  22  GLUCOSE 119* 127*  BUN 17 16  CREATININE 1.00 1.00  CALCIUM  --  9.2  AST  --  36  ALT  --  26  ALKPHOS  --  104  BILITOT  --  0.8   ------------------------------------------------------------------------------------------------------------------ estimated creatinine clearance is 59.7 mL/min (by C-G formula based on Cr of 1). ------------------------------------------------------------------------------------------------------------------ Clinton Hughes results for input(s): HGBA1C in the last 72 hours. ------------------------------------------------------------------------------------------------------------------  Recent Labs  09/18/14 2340 09/19/14 0523  CHOL 127 109  HDL 56 52  LDLCALC 50 45  TRIG 104 58  CHOLHDL 2.3 2.1   ------------------------------------------------------------------------------------------------------------------ Clinton Hughes results for input(s): TSH, T4TOTAL, T3FREE, THYROIDAB in the last 72 hours.  Invalid input(s): FREET3 ------------------------------------------------------------------------------------------------------------------ Clinton Hughes results for input(s): VITAMINB12, FOLATE, FERRITIN, TIBC, IRON, RETICCTPCT in the last 72 hours.  Coagulation profile  Recent Labs Lab 09/18/14 2340  INR 1.03    Clinton Hughes results for input(s): DDIMER in the last 72 hours.  Cardiac Enzymes Clinton Hughes results for input(s): CKMB, TROPONINI, MYOGLOBIN in the last 168 hours.  Invalid input(s): CK ------------------------------------------------------------------------------------------------------------------ Invalid input(s): POCBNP    Ezio Wieck D.O. on 09/19/2014 at  1:42 PM  Between 7am to 7pm - Pager - [phone]  After 7pm go to [email or web address] - password [email or web address]  And look for the night coverage person covering for me after hours  [facility] Office  [phone]

## 2014-09-19 NOTE — Progress Notes (Signed)
Physical Therapy Evaluation Patient Details Name: Clinton Hughes MRN: [id number] DOB: 1925-12-20 Today's Date: 09/19/2014   History of Present Illness  81 y.o. male admitted for sudden onset of aphasia, possible,TIA - DDX include seizure..  Clinical Impression  Pt admitted with the above complications. Pt currently with functional limitations due to the deficits listed below (see PT Problem List). Loss of balance towards right without use of an assistive device, requiring min assist to prevent fall. Improves significantly with use of a rolling walker for support, no physical assist needed for balance. States his balance has diminished since a fall approximately 2 weeks ago, when he passed out and subsequently hurt his back. Pt will benefit from skilled PT to increase their independence and safety with mobility to allow discharge to the venue listed below.       Follow Up Recommendations Home health PT;Other (comment) (Possible Outpatient Neuro Rehab if pt has transportation)    Equipment Recommendations  None recommended by PT    Recommendations for Other Services       Precautions / Restrictions Precautions Precautions: Fall Restrictions Weight Bearing Restrictions: No      Mobility  Bed Mobility Overal bed mobility: Modified Independent                Transfers Overall transfer level: Needs assistance Equipment used: None Transfers: Sit to/from Stand Sit to Stand: Min guard         General transfer comment: min guard for safety. mild sway noted. denies dizziness.  Ambulation/Gait Ambulation/Gait assistance: Min assist Ambulation Distance (Feet): 100 Feet Assistive device: Rolling walker (2 wheeled);None Gait Pattern/deviations: Step-through pattern;Decreased stride length;Staggering right;Trunk flexed Gait velocity: decreased   General Gait Details: Ambulated short distance of approx 10 feet without assistive device, required min assist for balance loss  towards Right, reaching for furniture for balance. Improves with RW for support. Educated on safe DME use with cues for upright posture. No loss of balance noted with this device.  Stairs            Wheelchair Mobility    Modified Rankin (Stroke Patients Only) Modified Rankin (Stroke Patients Only) Pre-Morbid Rankin Score: Moderate disability (since a fall 2 weeks ago) Modified Rankin: Moderately severe disability     Balance Overall balance assessment: Needs assistance;History of Falls Sitting-balance support: No upper extremity supported;Feet supported Sitting balance-Leahy Scale: Good     Standing balance support: No upper extremity supported Standing balance-Leahy Scale: Fair                               Pertinent Vitals/Pain Pain Assessment: No/denies pain  HR in 80-90s    Home Living Family/patient expects to be discharged to:: Private residence Living Arrangements: Spouse/significant other Available Help at Discharge: Family;Available 24 hours/day Type of Home: House Home Access: Stairs to enter Entrance Stairs-Rails: Psychiatric nurse of Steps: 3 Home Layout: Two level Home Equipment: Walker - 2 wheels;Cane - single point;Shower seat      Prior Function Level of Independence: Independent with assistive device(s)         Comments: Using RW as of 2 days ago     Hand Dominance   Dominant Hand: Right    Extremity/Trunk Assessment   Upper Extremity Assessment: Defer to OT evaluation           Lower Extremity Assessment: Overall WFL for tasks assessed (gross strength 4+/5, normal light touch sensation)  Communication   Communication: No difficulties (admitted with dysphasia)  Cognition Arousal/Alertness: Awake/alert Behavior During Therapy: WFL for tasks assessed/performed Overall Cognitive Status: Within Functional Limits for tasks assessed                      General Comments General  comments (skin integrity, edema, etc.): normal FNF test bil.    Exercises        Assessment/Plan    PT Assessment Patient needs continued PT services  PT Diagnosis Difficulty walking;Abnormality of gait   PT Problem List Decreased activity tolerance;Decreased balance;Decreased mobility;Decreased coordination;Decreased knowledge of use of DME  PT Treatment Interventions Gait training;DME instruction;Stair training;Functional mobility training;Therapeutic activities;Therapeutic exercise;Balance training;Neuromuscular re-education;Patient/family education   PT Goals (Current goals can be found in the Care Plan section) Acute Rehab PT Goals Patient Stated Goal: Go home PT Goal Formulation: With patient Time For Goal Achievement: 10/03/14 Potential to Achieve Goals: Good    Frequency Min 4X/week   Barriers to discharge        Co-evaluation               End of Session Equipment Utilized During Treatment: Gait belt Activity Tolerance: Patient tolerated treatment well;No increased pain Patient left: in bed;with call bell/phone within reach;Other (comment) (Transport in room to take pt to test) Nurse Communication: Mobility status;Precautions         Time: 0511-0211 PT Time Calculation (min) (ACUTE ONLY): 10 min   Charges:   PT Evaluation $Initial PT Evaluation Tier I: 1 Procedure     PT G CodesEllouise Newer 09/19/2014, 9:55 AM Elayne Snare, [place]

## 2014-09-20 ENCOUNTER — Encounter (HOSPITAL_COMMUNITY): Payer: Self-pay | Admitting: Radiology

## 2014-09-20 ENCOUNTER — Inpatient Hospital Stay (HOSPITAL_COMMUNITY): Payer: Medicare Other

## 2014-09-20 LAB — HEMOGLOBIN A1C
Hgb A1c MFr Bld: 5.6 % (ref 4.8–5.6)
Hgb A1c MFr Bld: 5.7 % — ABNORMAL HIGH (ref 4.8–5.6)
MEAN PLASMA GLUCOSE: 117 mg/dL
Mean Plasma Glucose: 114 mg/dL

## 2014-09-20 LAB — BASIC METABOLIC PANEL
ANION GAP: 10 (ref 5–15)
BUN: 12 mg/dL (ref 6–20)
CHLORIDE: 100 mmol/L — AB (ref 101–111)
CO2: 21 mmol/L — ABNORMAL LOW (ref 22–32)
Calcium: 9.2 mg/dL (ref 8.9–10.3)
Creatinine, Ser: 0.94 mg/dL (ref 0.61–1.24)
GFR calc Af Amer: >60 mL/min (ref >60)
Glucose, Bld: 107 mg/dL — ABNORMAL HIGH (ref 70–99)
POTASSIUM: 4.2 mmol/L (ref 3.5–5.1)
Sodium: 131 mmol/L — ABNORMAL LOW (ref 135–145)

## 2014-09-20 LAB — CBC
HCT: 33.7 % — ABNORMAL LOW (ref 39.0–52.0)
HEMOGLOBIN: 12 g/dL — AB (ref 13.0–17.0)
MCH: 33.7 pg (ref 26.0–34.0)
MCHC: 35.6 g/dL (ref 30.0–36.0)
MCV: 94.7 fL (ref 78.0–100.0)
Platelets: 237 10*3/uL (ref 150–400)
RBC: 3.56 MIL/uL — ABNORMAL LOW (ref 4.22–5.81)
RDW: 12.2 % (ref 11.5–15.5)
WBC: 8 10*3/uL (ref 4.0–10.5)

## 2014-09-20 LAB — TSH: TSH: 3.819 u[IU]/mL (ref 0.350–4.500)

## 2014-09-20 MED ORDER — IOHEXOL 350 MG/ML SOLN
80.0000 mL | Freq: Once | INTRAVENOUS | Status: AC | PRN
  Administered 2014-09-20: 80 mL via INTRAVENOUS

## 2014-09-20 NOTE — Progress Notes (Signed)
Physical Therapy Treatment Patient Details Name: Clinton Hughes MRN: [id number] DOB: May 02, 1926 Today's Date: 09/20/2014    History of Present Illness 81 y.o. male admitted for sudden onset of aphasia, possible,TIA - DDX include seizure..    PT Comments    Progressing towards PT goals. Continues to complain of lower back pain. Safely completed stair training today. Min assist for balance without a rolling walker, supervision when holding RW for support. Poor tolerance to static balance challenges. Patient will continue to benefit from skilled physical therapy services at home with HHPT to further improve independence with functional mobility. Understands importance of continued walker use for balance upon d/c.   Follow Up Recommendations  Home health PT;Other (comment) (Should seek quick progression to outpatient Neuro rehab.)     Equipment Recommendations  None recommended by PT    Recommendations for Other Services       Precautions / Restrictions Precautions Precautions: Fall Restrictions Weight Bearing Restrictions: No    Mobility  Bed Mobility Overal bed mobility: Modified Independent                Transfers Overall transfer level: Needs assistance Equipment used: Rolling walker (2 wheeled)   Sit to Stand: Supervision         General transfer comment: supervision for safety. Use of RW for support upon standing.  Ambulation/Gait Ambulation/Gait assistance: Min assist Ambulation Distance (Feet): 375 Feet Assistive device: Rolling walker (2 wheeled);None Gait Pattern/deviations: Step-through pattern;Drifts right/left Gait velocity: decreased   General Gait Details: Supervision for safety with use of a rolling walker. VC for forward gaze. Min assist without RW for support, hand held for stability with notable sway.    Stairs Stairs: Yes Stairs assistance: Supervision Stair Management: One rail Right;Step to pattern;Alternating  pattern;Forwards Number of Stairs: 3 General stair comments: Single rail on Rt similar to home environment. Practiced ascending with alternating step pattern and step-to pattern while descending stairs. No loss of balance.  Wheelchair Mobility    Modified Rankin (Stroke Patients Only) Modified Rankin (Stroke Patients Only) Pre-Morbid Rankin Score: Moderate disability (since a fall 2 weeks ago) Modified Rankin: Moderately severe disability     Balance                                    Cognition Arousal/Alertness: Awake/alert Behavior During Therapy: WFL for tasks assessed/performed Overall Cognitive Status: Within Functional Limits for tasks assessed                      Exercises Other Exercises Other Exercises: rhomberg stance eyes open and closed, cannot tolerate eyes closed due to posterior loss of balance. standing x30 seconds no assist with eyes open Other Exercises: tandem stance, cannot maintain without physical assist for balance bil.    General Comments        Pertinent Vitals/Pain Pain Assessment: 0-10 Pain Score:  ("Hurts when I walk" no value given) Pain Location: back Pain Intervention(s): Monitored during session;Repositioned (denied ICe)    Home Living                      Prior Function            PT Goals (current goals can now be found in the care plan section) Acute Rehab PT Goals Patient Stated Goal: Go home PT Goal Formulation: With patient Time For Goal Achievement: 10/03/14 Potential to Achieve  Goals: Good Progress towards PT goals: Progressing toward goals    Frequency  Min 4X/week    PT Plan Current plan remains appropriate    Co-evaluation             End of Session Equipment Utilized During Treatment: Gait belt Activity Tolerance: Patient tolerated treatment well Patient left: with call bell/phone within reach;in chair;with chair alarm set     Time: 1216-1229 PT Time Calculation (min)  (ACUTE ONLY): 13 min  Charges:  $Gait Training: 8-22 mins                    G Codes:      Ellouise Newer 09-29-14, 1:11 PM Camille Bal [place], [place]

## 2014-09-20 NOTE — Discharge Summary (Signed)
Physician Discharge Summary  Clinton Hughes YHC:[id number] DOB: 07-Dec-1925 DOA: 09/18/2014  PCP:  Melinda Crutch, MD  Admit date: 09/18/2014 Discharge date: 09/20/2014  Time spent: 45 minutes  Recommendations for Outpatient Follow-up:  Patient will be discharged to home with home health PT.  Patient will need to follow up with primary care provider within one week of discharge and Dr. Leonie Man, neurologist, within 1-2 months.  Patient should continue medications as prescribed.  Patient should follow a heart healthy diet.   Discharge Diagnoses:  Aphasia, suspect TIA Hypertension Chronic hyponatremia Alcohol abuse Hyperlipidemia  Discharge Condition: Stable  Diet recommendation: heart healthy diet  Filed Weights   09/18/14 2200  Weight: 97.07 kg (214 lb)    History of present illness:  on 09/18/2014 by Dr. Jennette Kettle Clinton Hughes is a 81 y.o. male who is brought in to the ED with c/o sudden onset of aphasia that started at 2100 this evening while watching TV. Apparently per wife, he developed a sudden blank stare, difficulty following storyline, then had difficulty expressing himself and not acting right. He states he couldn't articulate what he wanted to say. No noticeable face-arm-leg weakness. Brought to ED, initially had expressive aphasia with NIHSS 2. This has rapidly resolved without treatment. Has had 3-4 similar episodes over past 2 years, always recovers, extensive outpatient neurologic work up at [facility] is unrevealing.  Hospital Course:  Aphasia, suspect TIA vs CVA -CT head: Moderate atrophy and mild chronic small vessel disease, no acute intracranial finding -Cannot obtain MRI due to pacemaker -EEG: Normal drowsy EEG, no focal lateralizing or epileptiform features -Echocardiogram: pending -Carotid doppler: 1-39% internal carotid artery stenosis bilaterally, right temporal artery is patent with antegrade flow, unable to visualize the left vertebral artery -LDL  45 -hemoglobin A1c 5.7 -PT consulted and recommended home health physical therapy -OT and speech therapy had no further recommendations -Neurology consulted and appreciated, recommended interrogation of pacemaker and repeat CT scan -Continue plavix and statin -repeat CT: no acute intracranial process -Patient had no further episodes of aphasia   Hypertension -Continue metoprolol, Avapro  Chronic Hyponatremia -Per patient's wife, sodium was 125 last week, currently 128 -Per hospital records, sodium is chronically low with an average of approximately 127 since 2014 -TSH 3.819 -Seeing nephrology as an outpatient  Alcohol abuse -Patient currently not having any alcohol withdrawal symptoms -Discussed the appropriate amount of alcohol per day  Hyperlipidemia -Lipid panel: TC 109, triglycerides 58, HDL 52, LDL 45 -Continue statin  Procedures  Carotid Doppler EEG Echocardiogram  Consults  Neurology  Discharge Exam: Filed Vitals:   09/20/14 1117  BP: 173/71  Pulse: 70  Temp: 98.1 F (36.7 C)  Resp: 18   Exam  General: Well developed, well nourished, No distress  HEENT: NCAT, mucous membranes moist.   Cardiovascular: S1 S2 auscultated, RRR  Respiratory: Clear to auscultation   Abdomen: Soft, nontender, nondistended, + bowel sounds  Extremities: warm dry without cyanosis clubbing or edema  Neuro: AAOx3, nonfocal  Psych: Normal affect and demeanor   Discharge Instructions      Discharge Instructions    Discharge instructions    Complete by:  As directed   Patient will be discharged to home with home health PT.  Patient will need to follow up with primary care provider within one week of discharge and Dr. Leonie Man, neurologist, within 1-2 months.  Patient should continue medications as prescribed.  Patient should follow a heart healthy diet.  Medication List    TAKE these medications        ADVAIR DISKUS 250-50 MCG/DOSE Aepb  Generic drug:   Fluticasone-Salmeterol  Inhale 1 puff into the lungs 2 (two) times daily.     clopidogrel 75 MG tablet  Commonly known as:  PLAVIX  TAKE 1 TABLET BY MOUTH EVERY DAY     CRESTOR 20 MG tablet  Generic drug:  rosuvastatin  Take 20 mg by mouth daily.     DIOVAN 160 MG tablet  Generic drug:  valsartan  Take 80 mg by mouth daily.     fluticasone 50 MCG/ACT nasal spray  Commonly known as:  FLONASE  Place 2 sprays into both nostrils as needed.     isosorbide mononitrate 60 MG 24 hr tablet  Commonly known as:  IMDUR  Take 60 mg by mouth daily.     metoprolol 50 MG tablet  Commonly known as:  LOPRESSOR  Take 50 mg by mouth daily.     montelukast 10 MG tablet  Commonly known as:  SINGULAIR  Take 1 tablet by mouth every evening.     omeprazole 40 MG capsule  Commonly known as:  PRILOSEC  Take 40 mg by mouth daily.       No Known Allergies Follow-up Information    Follow up with  Melinda Crutch, MD. Schedule an appointment as soon as possible for a visit in 1 week.   Specialty:  Family Medicine   Why:  Hospital follow up   Contact information:   [place] [address] [phone]       Follow up with SETHI,PRAMOD, MD. Schedule an appointment as soon as possible for a visit in 1 month.   Specialties:  Neurology, Radiology   Why:  Hospital follow up, stroke clinic   Contact information:   [address] [place] [address] [phone]        The results of significant diagnostics from this hospitalization (including imaging, microbiology, ancillary and laboratory) are listed below for reference.    Significant Diagnostic Studies: Ct Angio Head W/cm &/or Wo Cm  09/20/2014   CLINICAL DATA:  Slurred speech.  Difficulty speaking.  Dysphagia.  EXAM: CT ANGIOGRAPHY HEAD  TECHNIQUE: Multidetector CT imaging of the head was performed using the standard protocol during bolus administration of intravenous contrast. Multiplanar CT image  reconstructions and MIPs were obtained to evaluate the vascular anatomy.  CONTRAST:  36mL OMNIPAQUE IOHEXOL 350 MG/ML SOLN  COMPARISON:  Head CT 09/19/2014, 09/18/2014 and 07/31/2014. MRI 02/15/2013.  FINDINGS: CT HEAD  Brain: Generalized brain atrophy. Mild chronic small-vessel ischemic changes of the deep white matter. No sign of acute infarction, mass lesion, hemorrhage, hydrocephalus or extra-axial collection.  Calvarium and skull base: Unremarkable  Paranasal sinuses: Clear  Orbits: Unremarkable  CTA HEAD  Anterior circulation: Both internal carotid arteries are widely patent through the skullbase. There is atherosclerotic calcification in the carotid siphon regions but no stenosis greater than 30%. Supra clinoid internal carotid arteries are widely patent bilaterally. Anterior and middle cerebral vessels are patent without proximal stenosis, aneurysm or vascular malformation.  Posterior circulation: Both vertebral arteries are patent through the foramen magnum. Patent posterior inferior cerebellar arteries bilaterally. No basilar stenosis. The basilar artery is small, because of fetal origin of both PCAs. No posterior circulation large or medium vessel occlusion or stenosis.  Venous sinuses: Patent and normal  Anatomic variants: Bilateral fetal origin PCA  Delayed phase:Unremarkable  IMPRESSION: No major vessel  occlusion or correctable proximal stenosis.   Electronically Signed   By: Nelson Chimes M.D.   On: 09/20/2014 12:00   Ct Head Wo Contrast  09/20/2014   CLINICAL DATA:  Followup transient ischemic attack. History of hypertension, seizures, movement disorder.  EXAM: CT HEAD WITHOUT CONTRAST  TECHNIQUE: Contiguous axial images were obtained from the base of the skull through the vertex without intravenous contrast.  COMPARISON:  CT of the head Sep 18, 2014  FINDINGS: The ventricles and sulci are normal for age. No intraparenchymal hemorrhage, mass effect nor midline shift. Patchy supratentorial white  matter hypodensities are within normal range for patient's age and though non-specific suggest sequelae of chronic small vessel ischemic disease. No acute large vascular territory infarcts.  No abnormal extra-axial fluid collections. Basal cisterns are patent. Moderate calcific atherosclerosis of the carotid siphons.  No skull fracture. The included ocular globes and orbital contents are non-suspicious. Bilateral ocular lens implants. The mastoid aircells and included paranasal sinuses are well-aerated.  IMPRESSION: No acute intracranial process.  Involutional changes. Mild white matter changes suggest chronic small vessel ischemic disease.   Electronically Signed   By: Elon Alas   On: 09/20/2014 00:19   Ct Head Wo Contrast  09/18/2014   CLINICAL DATA:  Aphasia.  EXAM: CT HEAD WITHOUT CONTRAST  TECHNIQUE: Contiguous axial images were obtained from the base of the skull through the vertex without intravenous contrast.  COMPARISON:  MRI 02/15/2013  FINDINGS: There is no intracranial hemorrhage, mass or evidence of acute infarction. There is moderate generalized atrophy. There is mild periventricular hypodensity which may represent chronic small vessel disease. The appearances are similar to the MRI of 02/15/2013. No superimposed acute finding is evident. Bones are intact, with mild chronic maxillary sinus disease which is also unchanged from 02/15/2013.  IMPRESSION: Moderate atrophy and mild chronic small vessel changes. No acute intracranial findings. These results were called by telephone at the time of interpretation on 09/18/2014 at 10:46 pm to Dr. Milton Ferguson , who verbally acknowledged these results.   Electronically Signed   By: Andreas Newport M.D.   On: 09/18/2014 22:48    Microbiology: No results found for this or any previous visit (from the past 240 hour(s)).   Labs: Basic Metabolic Panel:  Recent Labs Lab 09/18/14 2230 09/18/14 2340 09/20/14 0525  NA 129* 128* 131*  K 4.3 4.3  4.2  CL 96* 95* 100*  CO2  --  22 21*  GLUCOSE 119* 127* 107*  BUN 17 16 12   CREATININE 1.00 1.00 0.94  CALCIUM  --  9.2 9.2   Liver Function Tests:  Recent Labs Lab 09/18/14 2340  AST 36  ALT 26  ALKPHOS 104  BILITOT 0.8  PROT 6.6  ALBUMIN 4.0   No results for input(s): LIPASE, AMYLASE in the last 168 hours. No results for input(s): AMMONIA in the last 168 hours. CBC:  Recent Labs Lab 09/18/14 2230 09/18/14 2340 09/20/14 0525  WBC  --  10.3 8.0  NEUTROABS  --  7.1  --   HGB 13.3 12.9* 12.0*  HCT 39.0 36.6* 33.7*  MCV  --  95.3 94.7  PLT  --  254 237   Cardiac Enzymes: No results for input(s): CKTOTAL, CKMB, CKMBINDEX, TROPONINI in the last 168 hours. BNP: BNP (last 3 results) No results for input(s): BNP in the last 8760 hours.  ProBNP (last 3 results) No results for input(s): PROBNP in the last 8760 hours.  CBG:  Recent Labs Lab 09/18/14  2242 09/19/14 2217  GLUCAP 110* 109*       Signed:  Cristal Ford  [facility] 09/20/2014, 12:20 PM

## 2014-09-20 NOTE — Progress Notes (Signed)
Pt stated that he and his wife would schedule his follow-up appointment with Dr Melinda Crutch because pt is unsure of wife's schedule. Pt educated on the importance of following up with his PCP and pt verbalized understanding.

## 2014-09-20 NOTE — Progress Notes (Signed)
STROKE TEAM PROGRESS NOTE   HISTORY Clinton Hughes is an 81 y.o. male with a past medical history that is significant for HTN, hypercholesterolemia, CAD a/p CABG, PFO, TIA versus seizure with speech arrest, s/p pacemaker placement, recent syncope, brought in by EMS for further evaluation of aphasia, decreased responsiveness. Wife is at the bedside and tells me that they were home watching a movie when suddenly he had a blank stare, manifested that he was having trouble following the storyline, and then he started having difficulty expressing himself and not acting right. He said that he couldn't articulate well and say what he wanted to say. Wife did not notice face-arm-legs weakness, and he denied HA, vertigo, double vision, or visual impairment. Brought to ED where he had initial NIHSS 2 (exclusively language). CT brain was personally reviewed and showed no acute abnormality. Patient is on daily plavix. It is noteworthy that wife expressed that patient had had at least 3 or 4 similar episodes within the past 2 years, and he will gradually recover. Extensive outpatient neurological work up at [facility] neurology was unrevealing. Patient was last known well 09/18/14 at 8:30 pm. NIHSS: 2 MRS: 0 Patient was not administered TPA secondary to rapidly improving. He was admitted for further evaluation and treatment.   SUBJECTIVE (INTERVAL HISTORY) His wife is not at the bedside.  Overall he feels his condition is stable. He is sitting up in the chair. CT angios shows no significant large vessel stenosis. EEG was normal   OBJECTIVE Temp:  [97.7 F (36.5 C)-98.3 F (36.8 C)] 98.1 F (36.7 C) (05/06 1117) Pulse Rate:  [64-71] 70 (05/06 1117) Cardiac Rhythm:  [-] Ventricular paced (05/06 0354) Resp:  [18-20] 18 (05/06 1117) BP: (164-196)/(57-76) 173/71 mmHg (05/06 1117) SpO2:  [97 %-100 %] 100 % (05/06 1117)   Recent Labs Lab 09/18/14 2242 09/19/14 2217  GLUCAP 110* 109*    Recent Labs Lab  09/18/14 2230 09/18/14 2340 09/20/14 0525  NA 129* 128* 131*  K 4.3 4.3 4.2  CL 96* 95* 100*  CO2  --  22 21*  GLUCOSE 119* 127* 107*  BUN 17 16 12   CREATININE 1.00 1.00 0.94  CALCIUM  --  9.2 9.2    Recent Labs Lab 09/18/14 2340  AST 36  ALT 26  ALKPHOS 104  BILITOT 0.8  PROT 6.6  ALBUMIN 4.0    Recent Labs Lab 09/18/14 2230 09/18/14 2340 09/20/14 0525  WBC  --  10.3 8.0  NEUTROABS  --  7.1  --   HGB 13.3 12.9* 12.0*  HCT 39.0 36.6* 33.7*  MCV  --  95.3 94.7  PLT  --  254 237   No results for input(s): CKTOTAL, CKMB, CKMBINDEX, TROPONINI in the last 168 hours.  Recent Labs  09/18/14 2340  LABPROT 13.6  INR 1.03    Recent Labs  09/18/14 2325  COLORURINE YELLOW  LABSPEC 1.021  PHURINE 6.0  GLUCOSEU NEGATIVE  HGBUR NEGATIVE  BILIRUBINUR NEGATIVE  KETONESUR 15*  PROTEINUR NEGATIVE  UROBILINOGEN 1.0  NITRITE NEGATIVE  LEUKOCYTESUR NEGATIVE       Component Value Date/Time   CHOL 109 09/19/2014 0523   TRIG 58 09/19/2014 0523   HDL 52 09/19/2014 0523   CHOLHDL 2.1 09/19/2014 0523   VLDL 12 09/19/2014 0523   LDLCALC 45 09/19/2014 0523   Lab Results  Component Value Date   HGBA1C 5.7* 09/19/2014      Component Value Date/Time   LABOPIA NONE DETECTED 09/18/2014 2325  COCAINSCRNUR NONE DETECTED 09/18/2014 2325   LABBENZ NONE DETECTED 09/18/2014 2325   AMPHETMU NONE DETECTED 09/18/2014 2325   THCU NONE DETECTED 09/18/2014 2325   LABBARB NONE DETECTED 09/18/2014 2325     Recent Labs Lab 09/18/14 2340  ETH <5    Ct Angio Head W/cm &/or Wo Cm  09/20/2014   CLINICAL DATA:  Slurred speech.  Difficulty speaking.  Dysphagia.  EXAM: CT ANGIOGRAPHY HEAD  TECHNIQUE: Multidetector CT imaging of the head was performed using the standard protocol during bolus administration of intravenous contrast. Multiplanar CT image reconstructions and MIPs were obtained to evaluate the vascular anatomy.  CONTRAST:  1mL OMNIPAQUE IOHEXOL 350 MG/ML SOLN   COMPARISON:  Head CT 09/19/2014, 09/18/2014 and 07/31/2014. MRI 02/15/2013.  FINDINGS: CT HEAD  Brain: Generalized brain atrophy. Mild chronic small-vessel ischemic changes of the deep white matter. No sign of acute infarction, mass lesion, hemorrhage, hydrocephalus or extra-axial collection.  Calvarium and skull base: Unremarkable  Paranasal sinuses: Clear  Orbits: Unremarkable  CTA HEAD  Anterior circulation: Both internal carotid arteries are widely patent through the skullbase. There is atherosclerotic calcification in the carotid siphon regions but no stenosis greater than 30%. Supra clinoid internal carotid arteries are widely patent bilaterally. Anterior and middle cerebral vessels are patent without proximal stenosis, aneurysm or vascular malformation.  Posterior circulation: Both vertebral arteries are patent through the foramen magnum. Patent posterior inferior cerebellar arteries bilaterally. No basilar stenosis. The basilar artery is small, because of fetal origin of both PCAs. No posterior circulation large or medium vessel occlusion or stenosis.  Venous sinuses: Patent and normal  Anatomic variants: Bilateral fetal origin PCA  Delayed phase:Unremarkable  IMPRESSION: No major vessel occlusion or correctable proximal stenosis.   Electronically Signed   By: Nelson Chimes M.D.   On: 09/20/2014 12:00   Ct Head Wo Contrast  09/20/2014   CLINICAL DATA:  Followup transient ischemic attack. History of hypertension, seizures, movement disorder.  EXAM: CT HEAD WITHOUT CONTRAST  TECHNIQUE: Contiguous axial images were obtained from the base of the skull through the vertex without intravenous contrast.  COMPARISON:  CT of the head Sep 18, 2014  FINDINGS: The ventricles and sulci are normal for age. No intraparenchymal hemorrhage, mass effect nor midline shift. Patchy supratentorial white matter hypodensities are within normal range for patient's age and though non-specific suggest sequelae of chronic small vessel  ischemic disease. No acute large vascular territory infarcts.  No abnormal extra-axial fluid collections. Basal cisterns are patent. Moderate calcific atherosclerosis of the carotid siphons.  No skull fracture. The included ocular globes and orbital contents are non-suspicious. Bilateral ocular lens implants. The mastoid aircells and included paranasal sinuses are well-aerated.  IMPRESSION: No acute intracranial process.  Involutional changes. Mild white matter changes suggest chronic small vessel ischemic disease.   Electronically Signed   By: Elon Alas   On: 09/20/2014 00:19   Ct Head Wo Contrast  09/18/2014   CLINICAL DATA:  Aphasia.  EXAM: CT HEAD WITHOUT CONTRAST  TECHNIQUE: Contiguous axial images were obtained from the base of the skull through the vertex without intravenous contrast.  COMPARISON:  MRI 02/15/2013  FINDINGS: There is no intracranial hemorrhage, mass or evidence of acute infarction. There is moderate generalized atrophy. There is mild periventricular hypodensity which may represent chronic small vessel disease. The appearances are similar to the MRI of 02/15/2013. No superimposed acute finding is evident. Bones are intact, with mild chronic maxillary sinus disease which is also unchanged from 02/15/2013.  IMPRESSION:  Moderate atrophy and mild chronic small vessel changes. No acute intracranial findings. These results were called by telephone at the time of interpretation on 09/18/2014 at 10:46 pm to Dr. Milton Ferguson , who verbally acknowledged these results.   Electronically Signed   By: Andreas Newport M.D.   On: 09/18/2014 22:48   Carotid Doppler  1-39% internal carotid artery stenosis bilaterally. The right vertebral artery is patent with antegrade flow. Unable to visualize the left vertebral artery.  2D Echocardiogram   - Left ventricle: The cavity size was normal. Wall thickness wasincreased increased in a pattern of mild to moderate LVH.Systolic function was normal. The  estimated ejection fraction wasin the range of 55% to 60%. Wall motion was normal; there were noregional wall motion abnormalities. Doppler parameters areconsistent with abnormal left ventricular relaxation (grade 1diastolic dysfunction). - Mitral valve: Mildly calcified annulus. There was moderateregurgitation. - Left atrium: The atrium was mildly to moderately dilated. - Pulmonary arteries: Systolic pressure was mildly increased. PApeak pressure: 35 mm Hg (S).  EEG Normal drowsy electroencephalogram. There are no focal lateralizing or epileptiform features.     PHYSICAL EXAM Pleasant elderly Caucasian male not in distress. . Afebrile. Head is nontraumatic. Neck is supple without bruit.    Cardiac exam no murmur or gallop. Lungs are clear to auscultation. Distal pulses are well felt. Neurological Exam ;  Awake  Alert oriented x 3. Normal speech and language.eye movements full without nystagmus.fundi were not visualized. Vision acuity and fields appear normal. Hearing is normal. Palatal movements are normal. Face symmetric. Tongue midline. Normal strength, tone, reflexes and coordination. Normal sensation. Gait deferred. ASSESSMENT/PLAN [name] is a 81 y.o. male with history of HTN, hypercholesterolemia, CAD a/p CABG, PFO, TIA versus seizure with speech arrest, s/p pacemaker placement, recent syncope presenting with aphasia, decreased responsiveness. He did not receive IV t-PA due to rapidly improving symptoms.   Possible Stroke/TIA:  Workup underway  Resultant  Neuro deficits resolved  MRI  / MRA  Not able to do d/t device  CTA head in am  Carotid Doppler  No significant stenosis   2D Echo  No source of embolus   EEG  Neg for seizure. 2 EEGS in past also neg  Called [facility] to interrogate device to look for atrial fibrillation as source of stroke  Has had negative 30 day cardiac monitor in the past 2 years  HgbA1c pending  Heparin 5000 units sq tid  for VTE prophylaxis    clopidogrel 75 mg orally every day prior to admission, now on clopidogrel 75 mg orally every day  Patient counseled to be compliant with his antithrombotic medications  Ongoing aggressive stroke risk factor management  Therapy recommendations:  No ST needs, HH PT recommended  Disposition:  Home with wife with OP vs HH therapies  Hypertension  Stable  Hyperlipidemia  Home meds:  crestor 20, resumed in hospital  LDL 45, at goal < 70  Continue statin at discharge  Other Stroke Risk Factors  Advanced age  Former Cigarette smoker, quit smoking   ETOH use  Obesity, Body mass index is 31.7 kg/(m^2).   Presumed Hx TIA 2014 with speech arrest, not confirmed it was indeed a TIA. No history of stroke. Removed from history section of chart.  Coronary artery disease - CABG 21 years  Known PFO  Other Active Problems  Movement disorder  Hospital day # 2  Menahga [place] for Pager information 09/20/2014 5:35 PM  I have personally examined this patient, reviewed notes, independently viewed imaging studies, participated in medical decision making and plan of care. I have made any additions or clarifications directly to the above note. Agree with note above. He presented with transient episode of expressive speech difficulties and possibly brief altered sensorium possible to include TIA versus simple partial seizure.   Long discussion with patient  at the bedside and answered questions. Consider changing back to aspirin since Plavix did not work and may be more expensive. Stroke team will sign off currently call for questions.  Antony Contras, MD Medical Director [facility] Pager: [phone] 09/20/2014 5:35 PM    To contact Stroke Continuity provider, please refer to [email or web address] After hours, contact General Neurology

## 2014-09-20 NOTE — Progress Notes (Signed)
Discharge orders received. Pt and wife educated on discharge instructions and stroke education. Pt verbalized understanding and given discharge packet. IVs removed. Volunteers called to take pt downstairs by wheelchair.

## 2014-09-20 NOTE — Care Management Note (Signed)
Case Management Note  Patient Details  Name: Clinton Hughes MRN: [id number] Date of Birth: 06-Jan-1926  Subjective/Objective:           ADMITTED WITH TIA         Action/Plan: Talked to patient with spouse present about [place] choices, pt/ spouse chose [place] for home health care services; Mary with [facility] called for arrangmentsAneta Mins [phone]  Expected Discharge Date:  09/20/14               Expected Discharge Plan:  [place]  In-House Referral:     Discharge planning Services  CM Consult    HH Arranged:  PT [facility] Agency:  [facility]  Status of Service:  In process, will continue to follow     Sherrilyn Rist [phone] 09/20/2014, 1:25 PM

## 2014-09-23 DIAGNOSIS — I119 Hypertensive heart disease without heart failure: Secondary | ICD-10-CM | POA: Diagnosis not present

## 2014-09-23 DIAGNOSIS — G20 Parkinson's disease: Secondary | ICD-10-CM | POA: Diagnosis not present

## 2014-09-23 DIAGNOSIS — M519 Unspecified thoracic, thoracolumbar and lumbosacral intervertebral disc disorder: Secondary | ICD-10-CM | POA: Diagnosis not present

## 2014-09-23 DIAGNOSIS — I2510 Atherosclerotic heart disease of native coronary artery without angina pectoris: Secondary | ICD-10-CM | POA: Diagnosis not present

## 2014-09-23 DIAGNOSIS — Z8673 Personal history of transient ischemic attack (TIA), and cerebral infarction without residual deficits: Secondary | ICD-10-CM | POA: Diagnosis not present

## 2014-09-23 DIAGNOSIS — I951 Orthostatic hypotension: Secondary | ICD-10-CM | POA: Diagnosis not present

## 2014-09-24 DIAGNOSIS — M549 Dorsalgia, unspecified: Secondary | ICD-10-CM | POA: Diagnosis not present

## 2014-09-24 DIAGNOSIS — R479 Unspecified speech disturbances: Secondary | ICD-10-CM | POA: Diagnosis not present

## 2014-09-24 DIAGNOSIS — R42 Dizziness and giddiness: Secondary | ICD-10-CM | POA: Diagnosis not present

## 2014-09-24 DIAGNOSIS — M4802 Spinal stenosis, cervical region: Secondary | ICD-10-CM | POA: Diagnosis not present

## 2014-09-24 DIAGNOSIS — I10 Essential (primary) hypertension: Secondary | ICD-10-CM | POA: Diagnosis not present

## 2014-09-26 DIAGNOSIS — E871 Hypo-osmolality and hyponatremia: Secondary | ICD-10-CM | POA: Diagnosis not present

## 2014-09-26 DIAGNOSIS — I10 Essential (primary) hypertension: Secondary | ICD-10-CM | POA: Diagnosis not present

## 2014-09-27 DIAGNOSIS — Z8673 Personal history of transient ischemic attack (TIA), and cerebral infarction without residual deficits: Secondary | ICD-10-CM | POA: Diagnosis not present

## 2014-09-27 DIAGNOSIS — Z951 Presence of aortocoronary bypass graft: Secondary | ICD-10-CM | POA: Diagnosis not present

## 2014-09-27 DIAGNOSIS — I2510 Atherosclerotic heart disease of native coronary artery without angina pectoris: Secondary | ICD-10-CM | POA: Diagnosis not present

## 2014-09-27 DIAGNOSIS — M519 Unspecified thoracic, thoracolumbar and lumbosacral intervertebral disc disorder: Secondary | ICD-10-CM | POA: Diagnosis not present

## 2014-09-27 DIAGNOSIS — I208 Other forms of angina pectoris: Secondary | ICD-10-CM | POA: Diagnosis not present

## 2014-09-27 DIAGNOSIS — Z950 Presence of cardiac pacemaker: Secondary | ICD-10-CM | POA: Diagnosis not present

## 2014-09-27 DIAGNOSIS — R55 Syncope and collapse: Secondary | ICD-10-CM | POA: Diagnosis not present

## 2014-09-27 DIAGNOSIS — I951 Orthostatic hypotension: Secondary | ICD-10-CM | POA: Diagnosis not present

## 2014-09-27 DIAGNOSIS — I119 Hypertensive heart disease without heart failure: Secondary | ICD-10-CM | POA: Diagnosis not present

## 2014-09-27 DIAGNOSIS — E785 Hyperlipidemia, unspecified: Secondary | ICD-10-CM | POA: Diagnosis not present

## 2014-09-27 DIAGNOSIS — G20 Parkinson's disease: Secondary | ICD-10-CM | POA: Diagnosis not present

## 2014-09-27 DIAGNOSIS — Z79899 Other long term (current) drug therapy: Secondary | ICD-10-CM | POA: Diagnosis not present

## 2014-09-30 DIAGNOSIS — I951 Orthostatic hypotension: Secondary | ICD-10-CM | POA: Diagnosis not present

## 2014-09-30 DIAGNOSIS — Z8673 Personal history of transient ischemic attack (TIA), and cerebral infarction without residual deficits: Secondary | ICD-10-CM | POA: Diagnosis not present

## 2014-09-30 DIAGNOSIS — I2510 Atherosclerotic heart disease of native coronary artery without angina pectoris: Secondary | ICD-10-CM | POA: Diagnosis not present

## 2014-09-30 DIAGNOSIS — M519 Unspecified thoracic, thoracolumbar and lumbosacral intervertebral disc disorder: Secondary | ICD-10-CM | POA: Diagnosis not present

## 2014-09-30 DIAGNOSIS — G20 Parkinson's disease: Secondary | ICD-10-CM | POA: Diagnosis not present

## 2014-09-30 DIAGNOSIS — I119 Hypertensive heart disease without heart failure: Secondary | ICD-10-CM | POA: Diagnosis not present

## 2014-10-01 DIAGNOSIS — M545 Low back pain: Secondary | ICD-10-CM | POA: Diagnosis not present

## 2014-10-01 DIAGNOSIS — M47812 Spondylosis without myelopathy or radiculopathy, cervical region: Secondary | ICD-10-CM | POA: Diagnosis not present

## 2014-10-01 DIAGNOSIS — M542 Cervicalgia: Secondary | ICD-10-CM | POA: Diagnosis not present

## 2014-10-02 DIAGNOSIS — I2510 Atherosclerotic heart disease of native coronary artery without angina pectoris: Secondary | ICD-10-CM | POA: Diagnosis not present

## 2014-10-02 DIAGNOSIS — G20 Parkinson's disease: Secondary | ICD-10-CM | POA: Diagnosis not present

## 2014-10-02 DIAGNOSIS — I119 Hypertensive heart disease without heart failure: Secondary | ICD-10-CM | POA: Diagnosis not present

## 2014-10-02 DIAGNOSIS — M519 Unspecified thoracic, thoracolumbar and lumbosacral intervertebral disc disorder: Secondary | ICD-10-CM | POA: Diagnosis not present

## 2014-10-02 DIAGNOSIS — Z8673 Personal history of transient ischemic attack (TIA), and cerebral infarction without residual deficits: Secondary | ICD-10-CM | POA: Diagnosis not present

## 2014-10-02 DIAGNOSIS — I951 Orthostatic hypotension: Secondary | ICD-10-CM | POA: Diagnosis not present

## 2014-10-07 DIAGNOSIS — I119 Hypertensive heart disease without heart failure: Secondary | ICD-10-CM | POA: Diagnosis not present

## 2014-10-07 DIAGNOSIS — I2510 Atherosclerotic heart disease of native coronary artery without angina pectoris: Secondary | ICD-10-CM | POA: Diagnosis not present

## 2014-10-07 DIAGNOSIS — M519 Unspecified thoracic, thoracolumbar and lumbosacral intervertebral disc disorder: Secondary | ICD-10-CM | POA: Diagnosis not present

## 2014-10-07 DIAGNOSIS — Z8673 Personal history of transient ischemic attack (TIA), and cerebral infarction without residual deficits: Secondary | ICD-10-CM | POA: Diagnosis not present

## 2014-10-07 DIAGNOSIS — I951 Orthostatic hypotension: Secondary | ICD-10-CM | POA: Diagnosis not present

## 2014-10-07 DIAGNOSIS — G20 Parkinson's disease: Secondary | ICD-10-CM | POA: Diagnosis not present

## 2014-10-08 DIAGNOSIS — Z79899 Other long term (current) drug therapy: Secondary | ICD-10-CM | POA: Diagnosis not present

## 2014-10-08 DIAGNOSIS — I119 Hypertensive heart disease without heart failure: Secondary | ICD-10-CM | POA: Diagnosis not present

## 2014-10-08 DIAGNOSIS — I2510 Atherosclerotic heart disease of native coronary artery without angina pectoris: Secondary | ICD-10-CM | POA: Diagnosis not present

## 2014-10-08 DIAGNOSIS — E785 Hyperlipidemia, unspecified: Secondary | ICD-10-CM | POA: Diagnosis not present

## 2014-10-08 DIAGNOSIS — I951 Orthostatic hypotension: Secondary | ICD-10-CM | POA: Diagnosis not present

## 2014-10-08 DIAGNOSIS — I208 Other forms of angina pectoris: Secondary | ICD-10-CM | POA: Diagnosis not present

## 2014-10-08 DIAGNOSIS — M4982 Spondylopathy in diseases classified elsewhere, cervical region: Secondary | ICD-10-CM | POA: Diagnosis not present

## 2014-10-08 DIAGNOSIS — Z8673 Personal history of transient ischemic attack (TIA), and cerebral infarction without residual deficits: Secondary | ICD-10-CM | POA: Diagnosis not present

## 2014-10-09 DIAGNOSIS — I951 Orthostatic hypotension: Secondary | ICD-10-CM | POA: Diagnosis not present

## 2014-10-09 DIAGNOSIS — G20 Parkinson's disease: Secondary | ICD-10-CM | POA: Diagnosis not present

## 2014-10-09 DIAGNOSIS — M519 Unspecified thoracic, thoracolumbar and lumbosacral intervertebral disc disorder: Secondary | ICD-10-CM | POA: Diagnosis not present

## 2014-10-09 DIAGNOSIS — I2510 Atherosclerotic heart disease of native coronary artery without angina pectoris: Secondary | ICD-10-CM | POA: Diagnosis not present

## 2014-10-09 DIAGNOSIS — Z8673 Personal history of transient ischemic attack (TIA), and cerebral infarction without residual deficits: Secondary | ICD-10-CM | POA: Diagnosis not present

## 2014-10-09 DIAGNOSIS — I119 Hypertensive heart disease without heart failure: Secondary | ICD-10-CM | POA: Diagnosis not present

## 2014-10-14 DIAGNOSIS — M519 Unspecified thoracic, thoracolumbar and lumbosacral intervertebral disc disorder: Secondary | ICD-10-CM | POA: Diagnosis not present

## 2014-10-14 DIAGNOSIS — Z8673 Personal history of transient ischemic attack (TIA), and cerebral infarction without residual deficits: Secondary | ICD-10-CM | POA: Diagnosis not present

## 2014-10-14 DIAGNOSIS — I951 Orthostatic hypotension: Secondary | ICD-10-CM | POA: Diagnosis not present

## 2014-10-14 DIAGNOSIS — G20 Parkinson's disease: Secondary | ICD-10-CM | POA: Diagnosis not present

## 2014-10-14 DIAGNOSIS — I2510 Atherosclerotic heart disease of native coronary artery without angina pectoris: Secondary | ICD-10-CM | POA: Diagnosis not present

## 2014-10-14 DIAGNOSIS — I119 Hypertensive heart disease without heart failure: Secondary | ICD-10-CM | POA: Diagnosis not present

## 2014-10-16 DIAGNOSIS — I119 Hypertensive heart disease without heart failure: Secondary | ICD-10-CM | POA: Diagnosis not present

## 2014-10-16 DIAGNOSIS — Z8673 Personal history of transient ischemic attack (TIA), and cerebral infarction without residual deficits: Secondary | ICD-10-CM | POA: Diagnosis not present

## 2014-10-16 DIAGNOSIS — M519 Unspecified thoracic, thoracolumbar and lumbosacral intervertebral disc disorder: Secondary | ICD-10-CM | POA: Diagnosis not present

## 2014-10-16 DIAGNOSIS — I951 Orthostatic hypotension: Secondary | ICD-10-CM | POA: Diagnosis not present

## 2014-10-16 DIAGNOSIS — G20 Parkinson's disease: Secondary | ICD-10-CM | POA: Diagnosis not present

## 2014-10-16 DIAGNOSIS — I2510 Atherosclerotic heart disease of native coronary artery without angina pectoris: Secondary | ICD-10-CM | POA: Diagnosis not present

## 2014-10-21 DIAGNOSIS — I119 Hypertensive heart disease without heart failure: Secondary | ICD-10-CM | POA: Diagnosis not present

## 2014-10-21 DIAGNOSIS — I2510 Atherosclerotic heart disease of native coronary artery without angina pectoris: Secondary | ICD-10-CM | POA: Diagnosis not present

## 2014-10-21 DIAGNOSIS — M519 Unspecified thoracic, thoracolumbar and lumbosacral intervertebral disc disorder: Secondary | ICD-10-CM | POA: Diagnosis not present

## 2014-10-21 DIAGNOSIS — Z8673 Personal history of transient ischemic attack (TIA), and cerebral infarction without residual deficits: Secondary | ICD-10-CM | POA: Diagnosis not present

## 2014-10-21 DIAGNOSIS — G20 Parkinson's disease: Secondary | ICD-10-CM | POA: Diagnosis not present

## 2014-10-21 DIAGNOSIS — I951 Orthostatic hypotension: Secondary | ICD-10-CM | POA: Diagnosis not present

## 2014-10-23 DIAGNOSIS — M519 Unspecified thoracic, thoracolumbar and lumbosacral intervertebral disc disorder: Secondary | ICD-10-CM | POA: Diagnosis not present

## 2014-10-23 DIAGNOSIS — I951 Orthostatic hypotension: Secondary | ICD-10-CM | POA: Diagnosis not present

## 2014-10-23 DIAGNOSIS — I2510 Atherosclerotic heart disease of native coronary artery without angina pectoris: Secondary | ICD-10-CM | POA: Diagnosis not present

## 2014-10-23 DIAGNOSIS — I119 Hypertensive heart disease without heart failure: Secondary | ICD-10-CM | POA: Diagnosis not present

## 2014-10-23 DIAGNOSIS — G20 Parkinson's disease: Secondary | ICD-10-CM | POA: Diagnosis not present

## 2014-10-23 DIAGNOSIS — Z8673 Personal history of transient ischemic attack (TIA), and cerebral infarction without residual deficits: Secondary | ICD-10-CM | POA: Diagnosis not present

## 2014-10-28 DIAGNOSIS — M519 Unspecified thoracic, thoracolumbar and lumbosacral intervertebral disc disorder: Secondary | ICD-10-CM | POA: Diagnosis not present

## 2014-10-28 DIAGNOSIS — G20 Parkinson's disease: Secondary | ICD-10-CM | POA: Diagnosis not present

## 2014-10-28 DIAGNOSIS — I951 Orthostatic hypotension: Secondary | ICD-10-CM | POA: Diagnosis not present

## 2014-10-28 DIAGNOSIS — I2510 Atherosclerotic heart disease of native coronary artery without angina pectoris: Secondary | ICD-10-CM | POA: Diagnosis not present

## 2014-10-28 DIAGNOSIS — I119 Hypertensive heart disease without heart failure: Secondary | ICD-10-CM | POA: Diagnosis not present

## 2014-10-28 DIAGNOSIS — Z8673 Personal history of transient ischemic attack (TIA), and cerebral infarction without residual deficits: Secondary | ICD-10-CM | POA: Diagnosis not present

## 2014-10-30 DIAGNOSIS — I119 Hypertensive heart disease without heart failure: Secondary | ICD-10-CM | POA: Diagnosis not present

## 2014-10-30 DIAGNOSIS — Z8673 Personal history of transient ischemic attack (TIA), and cerebral infarction without residual deficits: Secondary | ICD-10-CM | POA: Diagnosis not present

## 2014-10-30 DIAGNOSIS — M519 Unspecified thoracic, thoracolumbar and lumbosacral intervertebral disc disorder: Secondary | ICD-10-CM | POA: Diagnosis not present

## 2014-10-30 DIAGNOSIS — K5900 Constipation, unspecified: Secondary | ICD-10-CM | POA: Diagnosis not present

## 2014-10-30 DIAGNOSIS — R1310 Dysphagia, unspecified: Secondary | ICD-10-CM | POA: Diagnosis not present

## 2014-10-30 DIAGNOSIS — G20 Parkinson's disease: Secondary | ICD-10-CM | POA: Diagnosis not present

## 2014-10-30 DIAGNOSIS — I951 Orthostatic hypotension: Secondary | ICD-10-CM | POA: Diagnosis not present

## 2014-10-30 DIAGNOSIS — I2510 Atherosclerotic heart disease of native coronary artery without angina pectoris: Secondary | ICD-10-CM | POA: Diagnosis not present

## 2014-11-04 DIAGNOSIS — Z8673 Personal history of transient ischemic attack (TIA), and cerebral infarction without residual deficits: Secondary | ICD-10-CM | POA: Diagnosis not present

## 2014-11-04 DIAGNOSIS — G20 Parkinson's disease: Secondary | ICD-10-CM | POA: Diagnosis not present

## 2014-11-04 DIAGNOSIS — I2510 Atherosclerotic heart disease of native coronary artery without angina pectoris: Secondary | ICD-10-CM | POA: Diagnosis not present

## 2014-11-04 DIAGNOSIS — M519 Unspecified thoracic, thoracolumbar and lumbosacral intervertebral disc disorder: Secondary | ICD-10-CM | POA: Diagnosis not present

## 2014-11-04 DIAGNOSIS — I951 Orthostatic hypotension: Secondary | ICD-10-CM | POA: Diagnosis not present

## 2014-11-04 DIAGNOSIS — I119 Hypertensive heart disease without heart failure: Secondary | ICD-10-CM | POA: Diagnosis not present

## 2014-11-06 DIAGNOSIS — I951 Orthostatic hypotension: Secondary | ICD-10-CM | POA: Diagnosis not present

## 2014-11-06 DIAGNOSIS — M519 Unspecified thoracic, thoracolumbar and lumbosacral intervertebral disc disorder: Secondary | ICD-10-CM | POA: Diagnosis not present

## 2014-11-06 DIAGNOSIS — I2510 Atherosclerotic heart disease of native coronary artery without angina pectoris: Secondary | ICD-10-CM | POA: Diagnosis not present

## 2014-11-06 DIAGNOSIS — G20 Parkinson's disease: Secondary | ICD-10-CM | POA: Diagnosis not present

## 2014-11-06 DIAGNOSIS — Z8673 Personal history of transient ischemic attack (TIA), and cerebral infarction without residual deficits: Secondary | ICD-10-CM | POA: Diagnosis not present

## 2014-11-06 DIAGNOSIS — I119 Hypertensive heart disease without heart failure: Secondary | ICD-10-CM | POA: Diagnosis not present

## 2014-11-11 ENCOUNTER — Other Ambulatory Visit: Payer: Self-pay

## 2014-11-11 DIAGNOSIS — Z8673 Personal history of transient ischemic attack (TIA), and cerebral infarction without residual deficits: Secondary | ICD-10-CM | POA: Diagnosis not present

## 2014-11-11 DIAGNOSIS — I119 Hypertensive heart disease without heart failure: Secondary | ICD-10-CM | POA: Diagnosis not present

## 2014-11-11 DIAGNOSIS — G20 Parkinson's disease: Secondary | ICD-10-CM | POA: Diagnosis not present

## 2014-11-11 DIAGNOSIS — I2510 Atherosclerotic heart disease of native coronary artery without angina pectoris: Secondary | ICD-10-CM | POA: Diagnosis not present

## 2014-11-11 DIAGNOSIS — M519 Unspecified thoracic, thoracolumbar and lumbosacral intervertebral disc disorder: Secondary | ICD-10-CM | POA: Diagnosis not present

## 2014-11-11 DIAGNOSIS — I951 Orthostatic hypotension: Secondary | ICD-10-CM | POA: Diagnosis not present

## 2014-11-13 DIAGNOSIS — Z8673 Personal history of transient ischemic attack (TIA), and cerebral infarction without residual deficits: Secondary | ICD-10-CM | POA: Diagnosis not present

## 2014-11-13 DIAGNOSIS — I951 Orthostatic hypotension: Secondary | ICD-10-CM | POA: Diagnosis not present

## 2014-11-13 DIAGNOSIS — I2510 Atherosclerotic heart disease of native coronary artery without angina pectoris: Secondary | ICD-10-CM | POA: Diagnosis not present

## 2014-11-13 DIAGNOSIS — I119 Hypertensive heart disease without heart failure: Secondary | ICD-10-CM | POA: Diagnosis not present

## 2014-11-13 DIAGNOSIS — M519 Unspecified thoracic, thoracolumbar and lumbosacral intervertebral disc disorder: Secondary | ICD-10-CM | POA: Diagnosis not present

## 2014-11-13 DIAGNOSIS — G20 Parkinson's disease: Secondary | ICD-10-CM | POA: Diagnosis not present

## 2014-12-23 DIAGNOSIS — E871 Hypo-osmolality and hyponatremia: Secondary | ICD-10-CM | POA: Diagnosis not present

## 2014-12-25 DIAGNOSIS — I10 Essential (primary) hypertension: Secondary | ICD-10-CM | POA: Diagnosis not present

## 2014-12-25 DIAGNOSIS — E871 Hypo-osmolality and hyponatremia: Secondary | ICD-10-CM | POA: Diagnosis not present

## 2014-12-27 DIAGNOSIS — Z8673 Personal history of transient ischemic attack (TIA), and cerebral infarction without residual deficits: Secondary | ICD-10-CM | POA: Diagnosis not present

## 2014-12-27 DIAGNOSIS — I951 Orthostatic hypotension: Secondary | ICD-10-CM | POA: Diagnosis not present

## 2014-12-27 DIAGNOSIS — M4982 Spondylopathy in diseases classified elsewhere, cervical region: Secondary | ICD-10-CM | POA: Diagnosis not present

## 2014-12-27 DIAGNOSIS — I2510 Atherosclerotic heart disease of native coronary artery without angina pectoris: Secondary | ICD-10-CM | POA: Diagnosis not present

## 2014-12-27 DIAGNOSIS — E785 Hyperlipidemia, unspecified: Secondary | ICD-10-CM | POA: Diagnosis not present

## 2014-12-27 DIAGNOSIS — I119 Hypertensive heart disease without heart failure: Secondary | ICD-10-CM | POA: Diagnosis not present

## 2014-12-27 DIAGNOSIS — I208 Other forms of angina pectoris: Secondary | ICD-10-CM | POA: Diagnosis not present

## 2014-12-27 DIAGNOSIS — Z950 Presence of cardiac pacemaker: Secondary | ICD-10-CM | POA: Diagnosis not present

## 2014-12-27 DIAGNOSIS — Z79899 Other long term (current) drug therapy: Secondary | ICD-10-CM | POA: Diagnosis not present

## 2014-12-31 DIAGNOSIS — M4982 Spondylopathy in diseases classified elsewhere, cervical region: Secondary | ICD-10-CM | POA: Diagnosis not present

## 2014-12-31 DIAGNOSIS — Z8673 Personal history of transient ischemic attack (TIA), and cerebral infarction without residual deficits: Secondary | ICD-10-CM | POA: Diagnosis not present

## 2014-12-31 DIAGNOSIS — Z79899 Other long term (current) drug therapy: Secondary | ICD-10-CM | POA: Diagnosis not present

## 2014-12-31 DIAGNOSIS — I2510 Atherosclerotic heart disease of native coronary artery without angina pectoris: Secondary | ICD-10-CM | POA: Diagnosis not present

## 2014-12-31 DIAGNOSIS — E785 Hyperlipidemia, unspecified: Secondary | ICD-10-CM | POA: Diagnosis not present

## 2014-12-31 DIAGNOSIS — I951 Orthostatic hypotension: Secondary | ICD-10-CM | POA: Diagnosis not present

## 2014-12-31 DIAGNOSIS — I208 Other forms of angina pectoris: Secondary | ICD-10-CM | POA: Diagnosis not present

## 2014-12-31 DIAGNOSIS — I119 Hypertensive heart disease without heart failure: Secondary | ICD-10-CM | POA: Diagnosis not present

## 2015-01-22 DIAGNOSIS — M545 Low back pain: Secondary | ICD-10-CM | POA: Diagnosis not present

## 2015-02-04 DIAGNOSIS — I951 Orthostatic hypotension: Secondary | ICD-10-CM | POA: Diagnosis not present

## 2015-02-04 DIAGNOSIS — Z8673 Personal history of transient ischemic attack (TIA), and cerebral infarction without residual deficits: Secondary | ICD-10-CM | POA: Diagnosis not present

## 2015-02-04 DIAGNOSIS — I25119 Atherosclerotic heart disease of native coronary artery with unspecified angina pectoris: Secondary | ICD-10-CM | POA: Diagnosis not present

## 2015-02-04 DIAGNOSIS — M488X2 Other specified spondylopathies, cervical region: Secondary | ICD-10-CM | POA: Diagnosis not present

## 2015-02-04 DIAGNOSIS — M488X6 Other specified spondylopathies, lumbar region: Secondary | ICD-10-CM | POA: Diagnosis not present

## 2015-02-04 DIAGNOSIS — I119 Hypertensive heart disease without heart failure: Secondary | ICD-10-CM | POA: Diagnosis not present

## 2015-02-06 DIAGNOSIS — I119 Hypertensive heart disease without heart failure: Secondary | ICD-10-CM | POA: Diagnosis not present

## 2015-02-06 DIAGNOSIS — I951 Orthostatic hypotension: Secondary | ICD-10-CM | POA: Diagnosis not present

## 2015-02-06 DIAGNOSIS — Z8673 Personal history of transient ischemic attack (TIA), and cerebral infarction without residual deficits: Secondary | ICD-10-CM | POA: Diagnosis not present

## 2015-02-06 DIAGNOSIS — M488X2 Other specified spondylopathies, cervical region: Secondary | ICD-10-CM | POA: Diagnosis not present

## 2015-02-06 DIAGNOSIS — M488X6 Other specified spondylopathies, lumbar region: Secondary | ICD-10-CM | POA: Diagnosis not present

## 2015-02-06 DIAGNOSIS — I25119 Atherosclerotic heart disease of native coronary artery with unspecified angina pectoris: Secondary | ICD-10-CM | POA: Diagnosis not present

## 2015-02-10 DIAGNOSIS — I951 Orthostatic hypotension: Secondary | ICD-10-CM | POA: Diagnosis not present

## 2015-02-10 DIAGNOSIS — I119 Hypertensive heart disease without heart failure: Secondary | ICD-10-CM | POA: Diagnosis not present

## 2015-02-10 DIAGNOSIS — M488X2 Other specified spondylopathies, cervical region: Secondary | ICD-10-CM | POA: Diagnosis not present

## 2015-02-10 DIAGNOSIS — Z8673 Personal history of transient ischemic attack (TIA), and cerebral infarction without residual deficits: Secondary | ICD-10-CM | POA: Diagnosis not present

## 2015-02-10 DIAGNOSIS — I25119 Atherosclerotic heart disease of native coronary artery with unspecified angina pectoris: Secondary | ICD-10-CM | POA: Diagnosis not present

## 2015-02-10 DIAGNOSIS — M488X6 Other specified spondylopathies, lumbar region: Secondary | ICD-10-CM | POA: Diagnosis not present

## 2015-02-12 DIAGNOSIS — Z8673 Personal history of transient ischemic attack (TIA), and cerebral infarction without residual deficits: Secondary | ICD-10-CM | POA: Diagnosis not present

## 2015-02-12 DIAGNOSIS — I25119 Atherosclerotic heart disease of native coronary artery with unspecified angina pectoris: Secondary | ICD-10-CM | POA: Diagnosis not present

## 2015-02-12 DIAGNOSIS — I119 Hypertensive heart disease without heart failure: Secondary | ICD-10-CM | POA: Diagnosis not present

## 2015-02-12 DIAGNOSIS — M488X2 Other specified spondylopathies, cervical region: Secondary | ICD-10-CM | POA: Diagnosis not present

## 2015-02-12 DIAGNOSIS — I951 Orthostatic hypotension: Secondary | ICD-10-CM | POA: Diagnosis not present

## 2015-02-12 DIAGNOSIS — M488X6 Other specified spondylopathies, lumbar region: Secondary | ICD-10-CM | POA: Diagnosis not present

## 2015-02-13 DIAGNOSIS — M545 Low back pain: Secondary | ICD-10-CM | POA: Diagnosis not present

## 2015-02-13 DIAGNOSIS — M47812 Spondylosis without myelopathy or radiculopathy, cervical region: Secondary | ICD-10-CM | POA: Diagnosis not present

## 2015-02-13 DIAGNOSIS — Z6827 Body mass index (BMI) 27.0-27.9, adult: Secondary | ICD-10-CM | POA: Diagnosis not present

## 2015-02-13 DIAGNOSIS — M542 Cervicalgia: Secondary | ICD-10-CM | POA: Diagnosis not present

## 2015-02-13 DIAGNOSIS — I10 Essential (primary) hypertension: Secondary | ICD-10-CM | POA: Diagnosis not present

## 2015-02-14 DIAGNOSIS — Z8673 Personal history of transient ischemic attack (TIA), and cerebral infarction without residual deficits: Secondary | ICD-10-CM | POA: Diagnosis not present

## 2015-02-14 DIAGNOSIS — I951 Orthostatic hypotension: Secondary | ICD-10-CM | POA: Diagnosis not present

## 2015-02-14 DIAGNOSIS — I25119 Atherosclerotic heart disease of native coronary artery with unspecified angina pectoris: Secondary | ICD-10-CM | POA: Diagnosis not present

## 2015-02-14 DIAGNOSIS — M488X2 Other specified spondylopathies, cervical region: Secondary | ICD-10-CM | POA: Diagnosis not present

## 2015-02-14 DIAGNOSIS — I119 Hypertensive heart disease without heart failure: Secondary | ICD-10-CM | POA: Diagnosis not present

## 2015-02-14 DIAGNOSIS — M488X6 Other specified spondylopathies, lumbar region: Secondary | ICD-10-CM | POA: Diagnosis not present

## 2015-02-17 DIAGNOSIS — I119 Hypertensive heart disease without heart failure: Secondary | ICD-10-CM | POA: Diagnosis not present

## 2015-02-17 DIAGNOSIS — I951 Orthostatic hypotension: Secondary | ICD-10-CM | POA: Diagnosis not present

## 2015-02-17 DIAGNOSIS — M488X6 Other specified spondylopathies, lumbar region: Secondary | ICD-10-CM | POA: Diagnosis not present

## 2015-02-17 DIAGNOSIS — I25119 Atherosclerotic heart disease of native coronary artery with unspecified angina pectoris: Secondary | ICD-10-CM | POA: Diagnosis not present

## 2015-02-17 DIAGNOSIS — M488X2 Other specified spondylopathies, cervical region: Secondary | ICD-10-CM | POA: Diagnosis not present

## 2015-02-17 DIAGNOSIS — Z8673 Personal history of transient ischemic attack (TIA), and cerebral infarction without residual deficits: Secondary | ICD-10-CM | POA: Diagnosis not present

## 2015-02-19 DIAGNOSIS — M488X6 Other specified spondylopathies, lumbar region: Secondary | ICD-10-CM | POA: Diagnosis not present

## 2015-02-19 DIAGNOSIS — I25119 Atherosclerotic heart disease of native coronary artery with unspecified angina pectoris: Secondary | ICD-10-CM | POA: Diagnosis not present

## 2015-02-19 DIAGNOSIS — I951 Orthostatic hypotension: Secondary | ICD-10-CM | POA: Diagnosis not present

## 2015-02-19 DIAGNOSIS — M488X2 Other specified spondylopathies, cervical region: Secondary | ICD-10-CM | POA: Diagnosis not present

## 2015-02-19 DIAGNOSIS — Z8673 Personal history of transient ischemic attack (TIA), and cerebral infarction without residual deficits: Secondary | ICD-10-CM | POA: Diagnosis not present

## 2015-02-19 DIAGNOSIS — I119 Hypertensive heart disease without heart failure: Secondary | ICD-10-CM | POA: Diagnosis not present

## 2015-02-21 DIAGNOSIS — I951 Orthostatic hypotension: Secondary | ICD-10-CM | POA: Diagnosis not present

## 2015-02-21 DIAGNOSIS — M488X6 Other specified spondylopathies, lumbar region: Secondary | ICD-10-CM | POA: Diagnosis not present

## 2015-02-21 DIAGNOSIS — Z8673 Personal history of transient ischemic attack (TIA), and cerebral infarction without residual deficits: Secondary | ICD-10-CM | POA: Diagnosis not present

## 2015-02-21 DIAGNOSIS — I119 Hypertensive heart disease without heart failure: Secondary | ICD-10-CM | POA: Diagnosis not present

## 2015-02-21 DIAGNOSIS — I25119 Atherosclerotic heart disease of native coronary artery with unspecified angina pectoris: Secondary | ICD-10-CM | POA: Diagnosis not present

## 2015-02-21 DIAGNOSIS — M488X2 Other specified spondylopathies, cervical region: Secondary | ICD-10-CM | POA: Diagnosis not present

## 2015-02-24 DIAGNOSIS — I951 Orthostatic hypotension: Secondary | ICD-10-CM | POA: Diagnosis not present

## 2015-02-24 DIAGNOSIS — M488X2 Other specified spondylopathies, cervical region: Secondary | ICD-10-CM | POA: Diagnosis not present

## 2015-02-24 DIAGNOSIS — I119 Hypertensive heart disease without heart failure: Secondary | ICD-10-CM | POA: Diagnosis not present

## 2015-02-24 DIAGNOSIS — I25119 Atherosclerotic heart disease of native coronary artery with unspecified angina pectoris: Secondary | ICD-10-CM | POA: Diagnosis not present

## 2015-02-24 DIAGNOSIS — M488X6 Other specified spondylopathies, lumbar region: Secondary | ICD-10-CM | POA: Diagnosis not present

## 2015-02-24 DIAGNOSIS — Z8673 Personal history of transient ischemic attack (TIA), and cerebral infarction without residual deficits: Secondary | ICD-10-CM | POA: Diagnosis not present

## 2015-02-26 DIAGNOSIS — M488X6 Other specified spondylopathies, lumbar region: Secondary | ICD-10-CM | POA: Diagnosis not present

## 2015-02-26 DIAGNOSIS — I119 Hypertensive heart disease without heart failure: Secondary | ICD-10-CM | POA: Diagnosis not present

## 2015-02-26 DIAGNOSIS — Z8673 Personal history of transient ischemic attack (TIA), and cerebral infarction without residual deficits: Secondary | ICD-10-CM | POA: Diagnosis not present

## 2015-02-26 DIAGNOSIS — I25119 Atherosclerotic heart disease of native coronary artery with unspecified angina pectoris: Secondary | ICD-10-CM | POA: Diagnosis not present

## 2015-02-26 DIAGNOSIS — M488X2 Other specified spondylopathies, cervical region: Secondary | ICD-10-CM | POA: Diagnosis not present

## 2015-02-26 DIAGNOSIS — I951 Orthostatic hypotension: Secondary | ICD-10-CM | POA: Diagnosis not present

## 2015-02-28 DIAGNOSIS — M488X2 Other specified spondylopathies, cervical region: Secondary | ICD-10-CM | POA: Diagnosis not present

## 2015-02-28 DIAGNOSIS — I119 Hypertensive heart disease without heart failure: Secondary | ICD-10-CM | POA: Diagnosis not present

## 2015-02-28 DIAGNOSIS — Z8673 Personal history of transient ischemic attack (TIA), and cerebral infarction without residual deficits: Secondary | ICD-10-CM | POA: Diagnosis not present

## 2015-02-28 DIAGNOSIS — I25119 Atherosclerotic heart disease of native coronary artery with unspecified angina pectoris: Secondary | ICD-10-CM | POA: Diagnosis not present

## 2015-02-28 DIAGNOSIS — M488X6 Other specified spondylopathies, lumbar region: Secondary | ICD-10-CM | POA: Diagnosis not present

## 2015-02-28 DIAGNOSIS — I951 Orthostatic hypotension: Secondary | ICD-10-CM | POA: Diagnosis not present

## 2015-03-03 DIAGNOSIS — I119 Hypertensive heart disease without heart failure: Secondary | ICD-10-CM | POA: Diagnosis not present

## 2015-03-03 DIAGNOSIS — M488X2 Other specified spondylopathies, cervical region: Secondary | ICD-10-CM | POA: Diagnosis not present

## 2015-03-03 DIAGNOSIS — Z23 Encounter for immunization: Secondary | ICD-10-CM | POA: Diagnosis not present

## 2015-03-03 DIAGNOSIS — Z8673 Personal history of transient ischemic attack (TIA), and cerebral infarction without residual deficits: Secondary | ICD-10-CM | POA: Diagnosis not present

## 2015-03-03 DIAGNOSIS — M488X6 Other specified spondylopathies, lumbar region: Secondary | ICD-10-CM | POA: Diagnosis not present

## 2015-03-03 DIAGNOSIS — I25119 Atherosclerotic heart disease of native coronary artery with unspecified angina pectoris: Secondary | ICD-10-CM | POA: Diagnosis not present

## 2015-03-03 DIAGNOSIS — Z0000 Encounter for general adult medical examination without abnormal findings: Secondary | ICD-10-CM | POA: Diagnosis not present

## 2015-03-03 DIAGNOSIS — E7800 Pure hypercholesterolemia, unspecified: Secondary | ICD-10-CM | POA: Diagnosis not present

## 2015-03-03 DIAGNOSIS — I951 Orthostatic hypotension: Secondary | ICD-10-CM | POA: Diagnosis not present

## 2015-03-04 DIAGNOSIS — Z0000 Encounter for general adult medical examination without abnormal findings: Secondary | ICD-10-CM | POA: Diagnosis not present

## 2015-03-04 DIAGNOSIS — Z23 Encounter for immunization: Secondary | ICD-10-CM | POA: Diagnosis not present

## 2015-03-04 DIAGNOSIS — E7800 Pure hypercholesterolemia, unspecified: Secondary | ICD-10-CM | POA: Diagnosis not present

## 2015-03-12 DIAGNOSIS — M488X2 Other specified spondylopathies, cervical region: Secondary | ICD-10-CM | POA: Diagnosis not present

## 2015-03-12 DIAGNOSIS — I119 Hypertensive heart disease without heart failure: Secondary | ICD-10-CM | POA: Diagnosis not present

## 2015-03-12 DIAGNOSIS — I25119 Atherosclerotic heart disease of native coronary artery with unspecified angina pectoris: Secondary | ICD-10-CM | POA: Diagnosis not present

## 2015-03-12 DIAGNOSIS — Z8673 Personal history of transient ischemic attack (TIA), and cerebral infarction without residual deficits: Secondary | ICD-10-CM | POA: Diagnosis not present

## 2015-03-12 DIAGNOSIS — I951 Orthostatic hypotension: Secondary | ICD-10-CM | POA: Diagnosis not present

## 2015-03-12 DIAGNOSIS — M488X6 Other specified spondylopathies, lumbar region: Secondary | ICD-10-CM | POA: Diagnosis not present

## 2015-03-14 DIAGNOSIS — I119 Hypertensive heart disease without heart failure: Secondary | ICD-10-CM | POA: Diagnosis not present

## 2015-03-14 DIAGNOSIS — M488X2 Other specified spondylopathies, cervical region: Secondary | ICD-10-CM | POA: Diagnosis not present

## 2015-03-14 DIAGNOSIS — I25119 Atherosclerotic heart disease of native coronary artery with unspecified angina pectoris: Secondary | ICD-10-CM | POA: Diagnosis not present

## 2015-03-14 DIAGNOSIS — Z8673 Personal history of transient ischemic attack (TIA), and cerebral infarction without residual deficits: Secondary | ICD-10-CM | POA: Diagnosis not present

## 2015-03-14 DIAGNOSIS — I951 Orthostatic hypotension: Secondary | ICD-10-CM | POA: Diagnosis not present

## 2015-03-14 DIAGNOSIS — M488X6 Other specified spondylopathies, lumbar region: Secondary | ICD-10-CM | POA: Diagnosis not present

## 2015-03-18 DIAGNOSIS — Z8673 Personal history of transient ischemic attack (TIA), and cerebral infarction without residual deficits: Secondary | ICD-10-CM | POA: Diagnosis not present

## 2015-03-18 DIAGNOSIS — I119 Hypertensive heart disease without heart failure: Secondary | ICD-10-CM | POA: Diagnosis not present

## 2015-03-18 DIAGNOSIS — M488X2 Other specified spondylopathies, cervical region: Secondary | ICD-10-CM | POA: Diagnosis not present

## 2015-03-18 DIAGNOSIS — I951 Orthostatic hypotension: Secondary | ICD-10-CM | POA: Diagnosis not present

## 2015-03-18 DIAGNOSIS — I25119 Atherosclerotic heart disease of native coronary artery with unspecified angina pectoris: Secondary | ICD-10-CM | POA: Diagnosis not present

## 2015-03-18 DIAGNOSIS — M488X6 Other specified spondylopathies, lumbar region: Secondary | ICD-10-CM | POA: Diagnosis not present

## 2015-03-20 DIAGNOSIS — I951 Orthostatic hypotension: Secondary | ICD-10-CM | POA: Diagnosis not present

## 2015-03-20 DIAGNOSIS — M488X2 Other specified spondylopathies, cervical region: Secondary | ICD-10-CM | POA: Diagnosis not present

## 2015-03-20 DIAGNOSIS — Z8673 Personal history of transient ischemic attack (TIA), and cerebral infarction without residual deficits: Secondary | ICD-10-CM | POA: Diagnosis not present

## 2015-03-20 DIAGNOSIS — I25119 Atherosclerotic heart disease of native coronary artery with unspecified angina pectoris: Secondary | ICD-10-CM | POA: Diagnosis not present

## 2015-03-20 DIAGNOSIS — I119 Hypertensive heart disease without heart failure: Secondary | ICD-10-CM | POA: Diagnosis not present

## 2015-03-20 DIAGNOSIS — M488X6 Other specified spondylopathies, lumbar region: Secondary | ICD-10-CM | POA: Diagnosis not present

## 2015-03-24 DIAGNOSIS — I951 Orthostatic hypotension: Secondary | ICD-10-CM | POA: Diagnosis not present

## 2015-03-24 DIAGNOSIS — M488X6 Other specified spondylopathies, lumbar region: Secondary | ICD-10-CM | POA: Diagnosis not present

## 2015-03-24 DIAGNOSIS — I25119 Atherosclerotic heart disease of native coronary artery with unspecified angina pectoris: Secondary | ICD-10-CM | POA: Diagnosis not present

## 2015-03-24 DIAGNOSIS — M488X2 Other specified spondylopathies, cervical region: Secondary | ICD-10-CM | POA: Diagnosis not present

## 2015-03-24 DIAGNOSIS — I119 Hypertensive heart disease without heart failure: Secondary | ICD-10-CM | POA: Diagnosis not present

## 2015-03-24 DIAGNOSIS — Z8673 Personal history of transient ischemic attack (TIA), and cerebral infarction without residual deficits: Secondary | ICD-10-CM | POA: Diagnosis not present

## 2015-03-26 DIAGNOSIS — I25119 Atherosclerotic heart disease of native coronary artery with unspecified angina pectoris: Secondary | ICD-10-CM | POA: Diagnosis not present

## 2015-03-26 DIAGNOSIS — Z8673 Personal history of transient ischemic attack (TIA), and cerebral infarction without residual deficits: Secondary | ICD-10-CM | POA: Diagnosis not present

## 2015-03-26 DIAGNOSIS — I951 Orthostatic hypotension: Secondary | ICD-10-CM | POA: Diagnosis not present

## 2015-03-26 DIAGNOSIS — I119 Hypertensive heart disease without heart failure: Secondary | ICD-10-CM | POA: Diagnosis not present

## 2015-03-26 DIAGNOSIS — M488X6 Other specified spondylopathies, lumbar region: Secondary | ICD-10-CM | POA: Diagnosis not present

## 2015-03-26 DIAGNOSIS — M488X2 Other specified spondylopathies, cervical region: Secondary | ICD-10-CM | POA: Diagnosis not present

## 2015-03-28 DIAGNOSIS — Z8673 Personal history of transient ischemic attack (TIA), and cerebral infarction without residual deficits: Secondary | ICD-10-CM | POA: Diagnosis not present

## 2015-03-28 DIAGNOSIS — I208 Other forms of angina pectoris: Secondary | ICD-10-CM | POA: Diagnosis not present

## 2015-03-28 DIAGNOSIS — I951 Orthostatic hypotension: Secondary | ICD-10-CM | POA: Diagnosis not present

## 2015-03-28 DIAGNOSIS — Z950 Presence of cardiac pacemaker: Secondary | ICD-10-CM | POA: Diagnosis not present

## 2015-03-28 DIAGNOSIS — I119 Hypertensive heart disease without heart failure: Secondary | ICD-10-CM | POA: Diagnosis not present

## 2015-03-28 DIAGNOSIS — Z79899 Other long term (current) drug therapy: Secondary | ICD-10-CM | POA: Diagnosis not present

## 2015-03-28 DIAGNOSIS — M4982 Spondylopathy in diseases classified elsewhere, cervical region: Secondary | ICD-10-CM | POA: Diagnosis not present

## 2015-03-28 DIAGNOSIS — I2510 Atherosclerotic heart disease of native coronary artery without angina pectoris: Secondary | ICD-10-CM | POA: Diagnosis not present

## 2015-03-28 DIAGNOSIS — E785 Hyperlipidemia, unspecified: Secondary | ICD-10-CM | POA: Diagnosis not present

## 2015-03-31 DIAGNOSIS — M488X2 Other specified spondylopathies, cervical region: Secondary | ICD-10-CM | POA: Diagnosis not present

## 2015-03-31 DIAGNOSIS — I25119 Atherosclerotic heart disease of native coronary artery with unspecified angina pectoris: Secondary | ICD-10-CM | POA: Diagnosis not present

## 2015-03-31 DIAGNOSIS — Z8673 Personal history of transient ischemic attack (TIA), and cerebral infarction without residual deficits: Secondary | ICD-10-CM | POA: Diagnosis not present

## 2015-03-31 DIAGNOSIS — I951 Orthostatic hypotension: Secondary | ICD-10-CM | POA: Diagnosis not present

## 2015-03-31 DIAGNOSIS — I119 Hypertensive heart disease without heart failure: Secondary | ICD-10-CM | POA: Diagnosis not present

## 2015-03-31 DIAGNOSIS — M488X6 Other specified spondylopathies, lumbar region: Secondary | ICD-10-CM | POA: Diagnosis not present

## 2015-04-01 DIAGNOSIS — M488X6 Other specified spondylopathies, lumbar region: Secondary | ICD-10-CM | POA: Diagnosis not present

## 2015-04-01 DIAGNOSIS — I119 Hypertensive heart disease without heart failure: Secondary | ICD-10-CM | POA: Diagnosis not present

## 2015-04-01 DIAGNOSIS — I25119 Atherosclerotic heart disease of native coronary artery with unspecified angina pectoris: Secondary | ICD-10-CM | POA: Diagnosis not present

## 2015-04-01 DIAGNOSIS — I951 Orthostatic hypotension: Secondary | ICD-10-CM | POA: Diagnosis not present

## 2015-04-01 DIAGNOSIS — M488X2 Other specified spondylopathies, cervical region: Secondary | ICD-10-CM | POA: Diagnosis not present

## 2015-04-01 DIAGNOSIS — Z8673 Personal history of transient ischemic attack (TIA), and cerebral infarction without residual deficits: Secondary | ICD-10-CM | POA: Diagnosis not present

## 2015-04-08 DIAGNOSIS — M542 Cervicalgia: Secondary | ICD-10-CM | POA: Diagnosis not present

## 2015-04-08 DIAGNOSIS — M545 Low back pain: Secondary | ICD-10-CM | POA: Diagnosis not present

## 2015-04-08 DIAGNOSIS — M47812 Spondylosis without myelopathy or radiculopathy, cervical region: Secondary | ICD-10-CM | POA: Diagnosis not present

## 2015-04-25 DIAGNOSIS — M549 Dorsalgia, unspecified: Secondary | ICD-10-CM | POA: Diagnosis not present

## 2015-04-28 DIAGNOSIS — M545 Low back pain: Secondary | ICD-10-CM | POA: Diagnosis not present

## 2015-04-30 DIAGNOSIS — M549 Dorsalgia, unspecified: Secondary | ICD-10-CM | POA: Diagnosis not present

## 2015-05-05 DIAGNOSIS — M545 Low back pain: Secondary | ICD-10-CM | POA: Diagnosis not present

## 2015-05-07 DIAGNOSIS — M545 Low back pain: Secondary | ICD-10-CM | POA: Diagnosis not present

## 2015-05-14 DIAGNOSIS — M545 Low back pain: Secondary | ICD-10-CM | POA: Diagnosis not present

## 2015-05-21 DIAGNOSIS — M549 Dorsalgia, unspecified: Secondary | ICD-10-CM | POA: Diagnosis not present

## 2015-05-23 DIAGNOSIS — M545 Low back pain: Secondary | ICD-10-CM | POA: Diagnosis not present

## 2015-05-28 DIAGNOSIS — M545 Low back pain: Secondary | ICD-10-CM | POA: Diagnosis not present

## 2015-05-28 DIAGNOSIS — K219 Gastro-esophageal reflux disease without esophagitis: Secondary | ICD-10-CM | POA: Diagnosis not present

## 2015-05-28 DIAGNOSIS — R1310 Dysphagia, unspecified: Secondary | ICD-10-CM | POA: Diagnosis not present

## 2015-05-30 DIAGNOSIS — M549 Dorsalgia, unspecified: Secondary | ICD-10-CM | POA: Diagnosis not present

## 2015-06-04 DIAGNOSIS — M549 Dorsalgia, unspecified: Secondary | ICD-10-CM | POA: Diagnosis not present

## 2015-06-09 DIAGNOSIS — M545 Low back pain: Secondary | ICD-10-CM | POA: Diagnosis not present

## 2015-06-11 ENCOUNTER — Ambulatory Visit (INDEPENDENT_AMBULATORY_CARE_PROVIDER_SITE_OTHER): Payer: Medicare Other | Admitting: Ophthalmology

## 2015-06-11 DIAGNOSIS — H35372 Puckering of macula, left eye: Secondary | ICD-10-CM | POA: Diagnosis not present

## 2015-06-11 DIAGNOSIS — I10 Essential (primary) hypertension: Secondary | ICD-10-CM

## 2015-06-11 DIAGNOSIS — H43813 Vitreous degeneration, bilateral: Secondary | ICD-10-CM

## 2015-06-11 DIAGNOSIS — H318 Other specified disorders of choroid: Secondary | ICD-10-CM | POA: Diagnosis not present

## 2015-06-11 DIAGNOSIS — H35033 Hypertensive retinopathy, bilateral: Secondary | ICD-10-CM | POA: Diagnosis not present

## 2015-06-11 DIAGNOSIS — M545 Low back pain: Secondary | ICD-10-CM | POA: Diagnosis not present

## 2015-06-17 DIAGNOSIS — M545 Low back pain: Secondary | ICD-10-CM | POA: Diagnosis not present

## 2015-07-08 DIAGNOSIS — M545 Low back pain: Secondary | ICD-10-CM | POA: Diagnosis not present

## 2015-07-08 DIAGNOSIS — M542 Cervicalgia: Secondary | ICD-10-CM | POA: Diagnosis not present

## 2015-07-10 DIAGNOSIS — E871 Hypo-osmolality and hyponatremia: Secondary | ICD-10-CM | POA: Diagnosis not present

## 2015-07-15 DIAGNOSIS — I10 Essential (primary) hypertension: Secondary | ICD-10-CM | POA: Diagnosis not present

## 2015-07-15 DIAGNOSIS — E871 Hypo-osmolality and hyponatremia: Secondary | ICD-10-CM | POA: Diagnosis not present

## 2015-07-17 DIAGNOSIS — I951 Orthostatic hypotension: Secondary | ICD-10-CM | POA: Diagnosis not present

## 2015-07-17 DIAGNOSIS — I208 Other forms of angina pectoris: Secondary | ICD-10-CM | POA: Diagnosis not present

## 2015-07-17 DIAGNOSIS — Z8673 Personal history of transient ischemic attack (TIA), and cerebral infarction without residual deficits: Secondary | ICD-10-CM | POA: Diagnosis not present

## 2015-07-17 DIAGNOSIS — Z79899 Other long term (current) drug therapy: Secondary | ICD-10-CM | POA: Diagnosis not present

## 2015-07-17 DIAGNOSIS — M4982 Spondylopathy in diseases classified elsewhere, cervical region: Secondary | ICD-10-CM | POA: Diagnosis not present

## 2015-07-17 DIAGNOSIS — I2510 Atherosclerotic heart disease of native coronary artery without angina pectoris: Secondary | ICD-10-CM | POA: Diagnosis not present

## 2015-07-17 DIAGNOSIS — E785 Hyperlipidemia, unspecified: Secondary | ICD-10-CM | POA: Diagnosis not present

## 2015-07-17 DIAGNOSIS — I119 Hypertensive heart disease without heart failure: Secondary | ICD-10-CM | POA: Diagnosis not present

## 2015-07-23 DIAGNOSIS — M4982 Spondylopathy in diseases classified elsewhere, cervical region: Secondary | ICD-10-CM | POA: Diagnosis not present

## 2015-07-23 DIAGNOSIS — Z950 Presence of cardiac pacemaker: Secondary | ICD-10-CM | POA: Diagnosis not present

## 2015-07-23 DIAGNOSIS — I119 Hypertensive heart disease without heart failure: Secondary | ICD-10-CM | POA: Diagnosis not present

## 2015-07-23 DIAGNOSIS — Z79899 Other long term (current) drug therapy: Secondary | ICD-10-CM | POA: Diagnosis not present

## 2015-07-23 DIAGNOSIS — I208 Other forms of angina pectoris: Secondary | ICD-10-CM | POA: Diagnosis not present

## 2015-07-23 DIAGNOSIS — I951 Orthostatic hypotension: Secondary | ICD-10-CM | POA: Diagnosis not present

## 2015-07-23 DIAGNOSIS — I2510 Atherosclerotic heart disease of native coronary artery without angina pectoris: Secondary | ICD-10-CM | POA: Diagnosis not present

## 2015-07-23 DIAGNOSIS — E785 Hyperlipidemia, unspecified: Secondary | ICD-10-CM | POA: Diagnosis not present

## 2015-07-23 DIAGNOSIS — Z8673 Personal history of transient ischemic attack (TIA), and cerebral infarction without residual deficits: Secondary | ICD-10-CM | POA: Diagnosis not present

## 2015-08-07 DIAGNOSIS — L820 Inflamed seborrheic keratosis: Secondary | ICD-10-CM | POA: Diagnosis not present

## 2015-08-07 DIAGNOSIS — L821 Other seborrheic keratosis: Secondary | ICD-10-CM | POA: Diagnosis not present

## 2015-09-02 DIAGNOSIS — M545 Low back pain: Secondary | ICD-10-CM | POA: Diagnosis not present

## 2015-10-01 DIAGNOSIS — M545 Low back pain: Secondary | ICD-10-CM | POA: Diagnosis not present

## 2015-12-16 DIAGNOSIS — J069 Acute upper respiratory infection, unspecified: Secondary | ICD-10-CM | POA: Diagnosis not present

## 2016-01-01 ENCOUNTER — Encounter (HOSPITAL_COMMUNITY): Payer: Self-pay | Admitting: Emergency Medicine

## 2016-01-01 ENCOUNTER — Observation Stay (HOSPITAL_COMMUNITY)
Admission: EM | Admit: 2016-01-01 | Discharge: 2016-01-03 | Disposition: A | Discharge status: Home / Self Care | Payer: Medicare Other | Attending: Family Medicine | Admitting: Family Medicine

## 2016-01-01 DIAGNOSIS — R062 Wheezing: Secondary | ICD-10-CM | POA: Insufficient documentation

## 2016-01-01 DIAGNOSIS — R55 Syncope and collapse: Principal | ICD-10-CM | POA: Insufficient documentation

## 2016-01-01 DIAGNOSIS — I951 Orthostatic hypotension: Secondary | ICD-10-CM | POA: Diagnosis present

## 2016-01-01 DIAGNOSIS — I2510 Atherosclerotic heart disease of native coronary artery without angina pectoris: Secondary | ICD-10-CM | POA: Insufficient documentation

## 2016-01-01 DIAGNOSIS — J9811 Atelectasis: Secondary | ICD-10-CM | POA: Diagnosis not present

## 2016-01-01 DIAGNOSIS — I119 Hypertensive heart disease without heart failure: Secondary | ICD-10-CM | POA: Diagnosis not present

## 2016-01-01 DIAGNOSIS — Z7902 Long term (current) use of antithrombotics/antiplatelets: Secondary | ICD-10-CM | POA: Insufficient documentation

## 2016-01-01 DIAGNOSIS — Z950 Presence of cardiac pacemaker: Secondary | ICD-10-CM | POA: Diagnosis not present

## 2016-01-01 DIAGNOSIS — S060X1A Concussion with loss of consciousness of 30 minutes or less, initial encounter: Secondary | ICD-10-CM | POA: Insufficient documentation

## 2016-01-01 DIAGNOSIS — K219 Gastro-esophageal reflux disease without esophagitis: Secondary | ICD-10-CM | POA: Diagnosis not present

## 2016-01-01 DIAGNOSIS — I495 Sick sinus syndrome: Secondary | ICD-10-CM | POA: Diagnosis not present

## 2016-01-01 DIAGNOSIS — Y92008 Other place in unspecified non-institutional (private) residence as the place of occurrence of the external cause: Secondary | ICD-10-CM | POA: Insufficient documentation

## 2016-01-01 DIAGNOSIS — F1010 Alcohol abuse, uncomplicated: Secondary | ICD-10-CM | POA: Diagnosis not present

## 2016-01-01 DIAGNOSIS — E871 Hypo-osmolality and hyponatremia: Secondary | ICD-10-CM | POA: Insufficient documentation

## 2016-01-01 DIAGNOSIS — S42035A Nondisplaced fracture of lateral end of left clavicle, initial encounter for closed fracture: Secondary | ICD-10-CM | POA: Diagnosis not present

## 2016-01-01 DIAGNOSIS — I442 Atrioventricular block, complete: Secondary | ICD-10-CM | POA: Diagnosis not present

## 2016-01-01 DIAGNOSIS — S43402A Unspecified sprain of left shoulder joint, initial encounter: Secondary | ICD-10-CM | POA: Insufficient documentation

## 2016-01-01 DIAGNOSIS — E7800 Pure hypercholesterolemia, unspecified: Secondary | ICD-10-CM | POA: Diagnosis not present

## 2016-01-01 DIAGNOSIS — W1830XA Fall on same level, unspecified, initial encounter: Secondary | ICD-10-CM | POA: Diagnosis not present

## 2016-01-01 DIAGNOSIS — Z79899 Other long term (current) drug therapy: Secondary | ICD-10-CM | POA: Insufficient documentation

## 2016-01-01 DIAGNOSIS — Z951 Presence of aortocoronary bypass graft: Secondary | ICD-10-CM | POA: Insufficient documentation

## 2016-01-01 DIAGNOSIS — Z87891 Personal history of nicotine dependence: Secondary | ICD-10-CM | POA: Insufficient documentation

## 2016-01-01 DIAGNOSIS — M25512 Pain in left shoulder: Secondary | ICD-10-CM | POA: Diagnosis present

## 2016-01-01 DIAGNOSIS — S42122A Displaced fracture of acromial process, left shoulder, initial encounter for closed fracture: Secondary | ICD-10-CM | POA: Insufficient documentation

## 2016-01-01 DIAGNOSIS — S0101XA Laceration without foreign body of scalp, initial encounter: Secondary | ICD-10-CM | POA: Insufficient documentation

## 2016-01-01 DIAGNOSIS — Z8673 Personal history of transient ischemic attack (TIA), and cerebral infarction without residual deficits: Secondary | ICD-10-CM | POA: Diagnosis not present

## 2016-01-01 DIAGNOSIS — R531 Weakness: Secondary | ICD-10-CM | POA: Diagnosis not present

## 2016-01-01 DIAGNOSIS — R404 Transient alteration of awareness: Secondary | ICD-10-CM | POA: Diagnosis not present

## 2016-01-01 DIAGNOSIS — S0990XA Unspecified injury of head, initial encounter: Secondary | ICD-10-CM | POA: Diagnosis not present

## 2016-01-01 MED ORDER — SODIUM CHLORIDE 0.9 % IV BOLUS (SEPSIS)
500.0000 mL | Freq: Once | INTRAVENOUS | Status: AC
  Administered 2016-01-02: 500 mL via INTRAVENOUS

## 2016-01-01 MED ORDER — FENTANYL CITRATE (PF) 100 MCG/2ML IJ SOLN
50.0000 ug | Freq: Once | INTRAMUSCULAR | Status: AC
  Administered 2016-01-02: 50 ug via INTRAVENOUS
  Filled 2016-01-01: qty 2

## 2016-01-01 MED ORDER — TETANUS-DIPHTH-ACELL PERTUSSIS 5-2.5-18.5 LF-MCG/0.5 IM SUSP
0.5000 mL | Freq: Once | INTRAMUSCULAR | Status: AC
  Administered 2016-01-02: 0.5 mL via INTRAMUSCULAR
  Filled 2016-01-01: qty 0.5

## 2016-01-01 NOTE — ED Provider Notes (Signed)
[facility] DEPT Provider Note   CSN: [id number] Arrival date & time: 01/01/16  2303     History   Chief Complaint Chief Complaint  Patient presents with  . Fall    HPI Clinton Hughes is a 82 y.o. male.  Patient presents today after a fall from home. Patient says he stood too quickly and took a few steps when he suddenly blacked out. He fell on his L shoulder and hit his head on the hardwood floor. Patient denies feeling light headed, dizzy, or experiencing any other prodromal symptoms. He reports a history of orthostatic hypotension and has experienced multiple similar falls. His wife who is at bedside says she was in the room next door when she heard him fall. He regained consciousness after a few seconds. He was immediately alert and oriented. Denies HA. No chest pain/SOB. He is now experiencing shoulder pain and says he cannot move his shoulder. He was seen by his PCP 2 weeks ago for cough and wheezing. He was given and albuterol inhaler for bronchitis. Patient denies fevers but says he has continued to cough and wheeze.       Past Medical History:  Diagnosis Date  . Coronary artery disease   . GERD (gastroesophageal reflux disease)   . H/O hiatal hernia   . High cholesterol   . Hypertensive heart disease   . Lumbar disc disease   . Movement disorder   . PFO (patent foramen ovale)   . Pneumonia 2014 X1  . Pneumonia and influenza 2013 X2  . Seizures ([place])   . TIA (transient ischemic attack) 03/02/2013   Presumed TIA. Hx of speech arrest October 2014. EEG neg    Patient Active Problem List   Diagnosis Date Noted  . Syncope 01/02/2016  . Nondisplaced fracture of lateral end of left clavicle 01/02/2016  . H/O TIA (transient ischemic attack) and stroke   . Complete heart block ([place]) 12/17/2013  . Sick sinus syndrome ([place]) 12/17/2013  . Cardiac pacemaker in situ 08/14/2013  . Hyponatremia 03/13/2013  . CAD (coronary artery disease) 03/12/2013  . Hypertensive  heart disease 03/12/2013  . Hyperlipidemia 03/12/2013  . Orthostatic hypotension 03/12/2013  . Lumbar disc disease 03/12/2013  . GERD (gastroesophageal reflux disease) 03/12/2013  . PFO (patent foramen ovale) 03/12/2013  . TIA (transient ischemic attack) 03/02/2013  . Alcohol abuse, daily use 02/14/2013  . Parkinsonism ([place]) 10/11/2012    Past Surgical History:  Procedure Laterality Date  . BACK SURGERY    . CATARACT EXTRACTION W/ INTRAOCULAR LENS  IMPLANT, BILATERAL Bilateral 2007-2010  . COLONOSCOPY  2006/2008  . CORONARY ARTERY BYPASS GRAFT  1995   "CABG X5" (02/14/2013)  . LUMBAR LAMINECTOMY  1980   "L5?" (02/14/2013)  . PACEMAKER INSERTION  08/14/13   MDT Adapta L implanted by Dr Rayann Heman for complete heart blcok  . PERMANENT PACEMAKER INSERTION N/A 08/14/2013   Procedure: PERMANENT PACEMAKER INSERTION;  Surgeon: Coralyn Mark, MD;  Location: [facility] CATH LAB;  Service: Cardiovascular;  Laterality: N/A;  . REFRACTIVE SURGERY Right ~ 02/07/2013   "to clear a couple clouds off" (02/14/2013)  . TEE WITHOUT CARDIOVERSION N/A 03/08/2013   Procedure: TRANSESOPHAGEAL ECHOCARDIOGRAM (TEE);  Surgeon: Fay Records, MD;  Location: [facility] ENDOSCOPY;  Service: Cardiovascular;  Laterality: N/A;       Home Medications    Prior to Admission medications   Medication Sig Start Date End Date Taking? Authorizing Provider  metoprolol (LOPRESSOR) 50 MG tablet Take 50 mg by mouth daily.  Historical Provider, MD  omeprazole (PRILOSEC) 40 MG capsule Take 40 mg by mouth daily.    Historical Provider, MD  rosuvastatin (CRESTOR) 20 MG tablet Take 20 mg by mouth daily.    Historical Provider, MD  valsartan (DIOVAN) 160 MG tablet Take 80 mg by mouth daily.     Historical Provider, MD    Family History Family History  Problem Relation Age of Onset  . Thyroid disease Sister   . Thyroid disease Other   . Epilepsy Grandchild   . Sleep walking Daughter     Social History Social History  Substance Use  Topics  . Smoking status: Former Smoker    Packs/day: 0.25    Years: 6.00    Types: Cigarettes  . Smokeless tobacco: Never Used     Comment: 02/14/2013 "quit smoking ~ 1985"  . Alcohol use 0.0 oz/week     Comment: occas     Allergies   Review of patient's allergies indicates no known allergies.   Review of Systems Review of Systems  Constitutional: Negative.   HENT: Negative.        Head injury   Eyes: Negative.   Respiratory: Positive for cough and wheezing.   Cardiovascular: Negative.  Negative for chest pain and palpitations.  Gastrointestinal: Negative.   Endocrine: Negative.   Genitourinary: Negative.   Musculoskeletal:       L shoulder pain   Skin: Negative.   Allergic/Immunologic: Negative.   Neurological: Positive for syncope. Negative for dizziness, seizures, light-headedness and headaches.  Hematological: Negative.   Psychiatric/Behavioral: Negative.      Physical Exam Updated Vital Signs BP (!) 143/82 (BP Location: Right Arm)   Pulse (!) 104   Temp 97.5 F (36.4 C)   Resp 18   SpO2 97%   Physical Exam  Constitutional: He appears well-developed and well-nourished.  HENT:  Head: Normocephalic.  Posterior scalp laceration  Eyes: Conjunctivae are normal.  Neck: Neck supple.  Cardiovascular: Normal rate, regular rhythm and normal heart sounds.   No murmur heard. Well healed incision over sternum   Pulmonary/Chest: Effort normal. No respiratory distress. He has wheezes.  Abdominal: Soft. There is no tenderness.  Musculoskeletal: He exhibits no edema.  Pain on palpation of L shoulder, unable to abduct. Shoulder adducted and internally rotated  Neurological: He is alert.  Skin: Skin is warm and dry.  Psychiatric: He has a normal mood and affect.  Nursing note and vitals reviewed.    ED Treatments / Results  Labs (all labs ordered are listed, but only abnormal results are displayed) Labs Reviewed  CBC WITH DIFFERENTIAL/PLATELET - Abnormal;  Notable for the following:       Result Value   WBC 11.0 (*)    RBC 3.46 (*)    Hemoglobin 11.4 (*)    HCT 33.7 (*)    Eosinophils Absolute 0.8 (*)    All other components within normal limits  BASIC METABOLIC PANEL - Abnormal; Notable for the following:    Sodium 127 (*)    Chloride 99 (*)    CO2 19 (*)    Glucose, Bld 125 (*)    All other components within normal limits  BASIC METABOLIC PANEL  CBC  I-STAT TROPOININ, ED    EKG  EKG Interpretation  Date/Time:  Friday January 02 2016 00:17:42 EDT Ventricular Rate:  96 PR Interval:    QRS Duration: 157 QT Interval:  396 QTC Calculation: 501 R Axis:   -99 Text Interpretation:  Sinus or ectopic  atrial rhythm nonspecific IV conduction delay No significant change since last tracing Confirmed by Christy Gentles  MD, [place] ([id number]) on 01/02/2016 12:24:05 AM       Radiology Dg Chest 2 View  Result Date: 01/02/2016 CLINICAL DATA:  Acute onset of left shoulder pain and syncope. Initial encounter. EXAM: CHEST  2 VIEW COMPARISON:  Chest radiograph performed 11/05/2013, and CT of the chest performed 11/15/2013 FINDINGS: The lungs are well-aerated. Mild bilateral atelectasis is noted. There is no evidence of pleural effusion or pneumothorax. The heart is mildly enlarged. The patient is status post median sternotomy. A pacemaker is noted at the left chest wall, with leads ending at the right atrium and right ventricle. No acute osseous abnormalities are seen. IMPRESSION: Mild bilateral atelectasis noted. Lungs otherwise clear. Mild cardiomegaly. No displaced rib fracture seen. Electronically Signed   By: Garald Balding M.D.   On: 01/02/2016 00:58   Ct Head Wo Contrast  Result Date: 01/02/2016 CLINICAL DATA:  Status post fall in hallway. Hit head. Bleeding at the posterior aspect of the head, with large laceration. Initial encounter. Initial encounter. EXAM: CT HEAD WITHOUT CONTRAST TECHNIQUE: Contiguous axial images were obtained from the base of the  skull through the vertex without intravenous contrast. COMPARISON:  CT of the head performed 09/20/2014 FINDINGS: Brain: No evidence of acute infarction, hemorrhage, hydrocephalus, extra-axial collection or mass lesion/mass effect. Prominence of the ventricles and sulci reflects moderate cortical volume loss. Cerebellar atrophy is noted. Scattered periventricular and subcortical white matter change likely reflects small vessel ischemic microangiopathy. The brainstem and fourth ventricle are within normal limits. The basal ganglia are unremarkable in appearance. The cerebral hemispheres demonstrate grossly normal gray-white differentiation. No mass effect or midline shift is seen. Vascular: No hyperdense vessel or unexpected calcification. Skull: There is no evidence of fracture; visualized osseous structures are unremarkable in appearance. Sinuses/Orbits: The orbits are within normal limits. Mucosal thickening is noted at the maxillary sinuses bilaterally. The remaining paranasal sinuses and mastoid air cells are well-aerated. Other: A soft tissue laceration is noted overlying the left posterior parietal calvarium. IMPRESSION: 1. No evidence of traumatic intracranial injury or fracture. 2. Soft tissue laceration overlying the left posterior parietal calvarium. 3. Moderate cortical volume loss and scattered small vessel ischemic microangiopathy. 4. Mucosal thickening at the maxillary sinuses bilaterally. Electronically Signed   By: Garald Balding M.D.   On: 01/02/2016 01:44   Ct Shoulder Left Wo Contrast  Result Date: 01/02/2016 CLINICAL DATA:  Left shoulder and clavicular pain following a fall. EXAM: CT OF THE LEFT SHOULDER WITHOUT CONTRAST TECHNIQUE: Multidetector CT imaging was performed according to the standard protocol. Multiplanar CT image reconstructions were also generated. COMPARISON:  Left shoulder radiographs 01/02/2016 FINDINGS: Acute appearing comminuted fractures of the left acromion with some  inferior displacement of the distal fracture fragments. Old ununited fracture of the distal left clavicle. Acute comminuted displaced and distracted fractures of the medial left clavicle. Soft tissue infiltration around the medial clavicle and around the acromial on consistent with contusion. Moderately large subcoracoid bursal fluid collection. Severe degenerative changes in the left shoulder involving the glenohumeral joint with near complete loss of the joint space and large osteophytes and subcortical cysts present. Heterotopic calcification inferior to the glenohumeral joint. Calcifications in the rotator cuff tendons consistent with calcific tendinitis. Old appearing left anterior rib fracture. IMPRESSION: Acute comminuted fractures of the left acromion and of the medial left clavicle. Associated soft tissue contusions. Old ununited fracture of the distal left clavicle. Severe degenerative changes  in the glenohumeral joint. Large subcoracoid bursa. Calcifications in the rotator cuff consistent with calcific tendinitis. Electronically Signed   By: Lucienne Capers M.D.   On: 01/02/2016 04:19   Dg Shoulder Left  Result Date: 01/02/2016 CLINICAL DATA:  Acute onset of left shoulder pain. Initial encounter. EXAM: LEFT SHOULDER - 2+ VIEW COMPARISON:  Chest radiograph performed 11/05/2013 FINDINGS: There is no evidence of acute fracture or dislocation. The left humeral head is seated within the glenoid fossa. A prominent osteophyte is seen arising at the medial aspect of the humeral head. There is chronic nonunion of a fracture at the distal left clavicle. The left acromioclavicular joint is not well assessed. Cortical irregularity at the left acromion may reflect a chronic left acromial fracture. A pacemaker is noted overlying the left chest wall. No displaced rib fractures are seen. No significant soft tissue abnormalities are seen. The visualized portions of the left lung are clear. IMPRESSION: 1. No evidence  of acute fracture or dislocation. 2. 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 3. Cortical irregularity of the left acromion may reflect a chronic left acromial fracture. Electronically Signed   By: Garald Balding M.D.   On: 01/02/2016 01:01    Procedures Procedures (including critical care time)  Medications Ordered in ED Medications  metoprolol (LOPRESSOR) tablet 50 mg (not administered)  rosuvastatin (CRESTOR) tablet 20 mg (not administered)  irbesartan (AVAPRO) tablet 150 mg (not administered)  pantoprazole (PROTONIX) EC tablet 40 mg (not administered)  sodium chloride flush (NS) 0.9 % injection 3 mL (not administered)  acetaminophen (TYLENOL) tablet 650 mg (not administered)    Or  acetaminophen (TYLENOL) suppository 650 mg (not administered)  HYDROcodone-acetaminophen (NORCO/VICODIN) 5-325 MG per tablet 1-2 tablet (not administered)  HYDROmorphone (DILAUDID) injection 0.5 mg (not administered)  sodium chloride 0.9 % bolus 500 mL (0 mLs Intravenous Stopped 01/02/16 0244)  Tdap (BOOSTRIX) injection 0.5 mL (0.5 mLs Intramuscular Given 01/02/16 0028)  fentaNYL (SUBLIMAZE) injection 50 mcg (50 mcg Intravenous Given 01/02/16 0028)  lidocaine-EPINEPHrine-tetracaine (LET) solution (3 mLs Topical Given 01/02/16 0209)  albuterol (PROVENTIL) (2.5 MG/3ML) 0.083% nebulizer solution 2.5 mg (2.5 mg Nebulization Given 01/02/16 0243)  lidocaine (PF) (XYLOCAINE) 1 % injection 30 mL (30 mLs Intradermal Given 01/02/16 0251)  fentaNYL (SUBLIMAZE) injection 50 mcg (50 mcg Intravenous Given 01/02/16 0311)     Initial Impression / Assessment and Plan / ED Course  I have reviewed the triage vital signs and the nursing notes.  Pertinent labs & imaging results that were available during my care of the patient were reviewed by me and considered in my medical decision making (see chart for details).  Clinical Course   Syncope: Likely orthostatic hypotension. Given 1/2 L NS bolus. Pacemaker  interrogated with no cardiac events. No chest pain. Troponin 0.03. EKG V-paced without acute ischemic changes. Patient with history of recurrent orthostatic hypotension and falls. Admit for observation and tele monitoring.   L shoulder pain: Adducted and internally rotated with very limited ROM. Given 50 mcg fentanyl IV x 2 for pain with minimal improvement in ROM. Xray with no acute fracture or dislocation. CT shoulder pending.   Head laceration: Bleeding controlled on presentation. Wound cleaned and closed with staples.   Cough and Wheezing: Seen by PCP 2 weeks ago and given albuterol inhaler for bronchitis. No history of asthma/COPD. Denies fevers. CXR negative. Given albuterol nebulizer in ED.    Final Clinical Impressions(s) / ED Diagnoses   Final diagnoses:  Syncope, unspecified syncope type  Scalp laceration, initial encounter  Wheezing  Concussion, with loss of consciousness of 30 minutes or less, initial encounter  Sprain of left shoulder, initial encounter    New Prescriptions Current Discharge Medication List       Velna Ochs, MD 01/02/16 [id number]    Ripley Fraise, MD 01/02/16 2351

## 2016-01-01 NOTE — ED Triage Notes (Signed)
Pt arrived to Ed via EMS. Pt fell in hallway after getting up to fast. History of lightheadness. Reports hitting head. No LOC. Bleeding at posterior head. Left shoulder tenderness. Pt takes 324 mg daily.  Pain with left arm movement. Pain reported at 8 on scale of 0-10.

## 2016-01-02 ENCOUNTER — Emergency Department (HOSPITAL_COMMUNITY): Payer: Medicare Other

## 2016-01-02 ENCOUNTER — Encounter (HOSPITAL_COMMUNITY): Payer: Self-pay | Admitting: Family Medicine

## 2016-01-02 DIAGNOSIS — E871 Hypo-osmolality and hyponatremia: Secondary | ICD-10-CM

## 2016-01-02 DIAGNOSIS — Z950 Presence of cardiac pacemaker: Secondary | ICD-10-CM

## 2016-01-02 DIAGNOSIS — S42035A Nondisplaced fracture of lateral end of left clavicle, initial encounter for closed fracture: Secondary | ICD-10-CM | POA: Diagnosis not present

## 2016-01-02 DIAGNOSIS — R55 Syncope and collapse: Secondary | ICD-10-CM | POA: Diagnosis not present

## 2016-01-02 DIAGNOSIS — I442 Atrioventricular block, complete: Secondary | ICD-10-CM | POA: Diagnosis not present

## 2016-01-02 DIAGNOSIS — M25512 Pain in left shoulder: Secondary | ICD-10-CM | POA: Diagnosis not present

## 2016-01-02 DIAGNOSIS — I951 Orthostatic hypotension: Secondary | ICD-10-CM

## 2016-01-02 DIAGNOSIS — S42122A Displaced fracture of acromial process, left shoulder, initial encounter for closed fracture: Secondary | ICD-10-CM | POA: Diagnosis not present

## 2016-01-02 DIAGNOSIS — J9811 Atelectasis: Secondary | ICD-10-CM | POA: Diagnosis not present

## 2016-01-02 DIAGNOSIS — F1010 Alcohol abuse, uncomplicated: Secondary | ICD-10-CM | POA: Diagnosis not present

## 2016-01-02 DIAGNOSIS — S0990XA Unspecified injury of head, initial encounter: Secondary | ICD-10-CM | POA: Diagnosis not present

## 2016-01-02 DIAGNOSIS — S42032A Displaced fracture of lateral end of left clavicle, initial encounter for closed fracture: Secondary | ICD-10-CM | POA: Diagnosis not present

## 2016-01-02 LAB — BASIC METABOLIC PANEL
ANION GAP: 9 (ref 5–15)
ANION GAP: 9 (ref 5–15)
BUN: 13 mg/dL (ref 6–20)
BUN: 14 mg/dL (ref 6–20)
CALCIUM: 9.3 mg/dL (ref 8.9–10.3)
CHLORIDE: 99 mmol/L — AB (ref 101–111)
CO2: 19 mmol/L — ABNORMAL LOW (ref 22–32)
CO2: 23 mmol/L (ref 22–32)
Calcium: 9.1 mg/dL (ref 8.9–10.3)
Chloride: 99 mmol/L — ABNORMAL LOW (ref 101–111)
Creatinine, Ser: 0.94 mg/dL (ref 0.61–1.24)
Creatinine, Ser: 0.92 mg/dL (ref 0.61–1.24)
GFR calc Af Amer: >60 mL/min (ref >60)
GFR calc Af Amer: >60 mL/min (ref >60)
GLUCOSE: 141 mg/dL — AB (ref 65–99)
Glucose, Bld: 125 mg/dL — ABNORMAL HIGH (ref 65–99)
POTASSIUM: 4.5 mmol/L (ref 3.5–5.1)
POTASSIUM: 4.8 mmol/L (ref 3.5–5.1)
SODIUM: 127 mmol/L — AB (ref 135–145)
Sodium: 131 mmol/L — ABNORMAL LOW (ref 135–145)

## 2016-01-02 LAB — CBC
HEMATOCRIT: 31.4 % — AB (ref 39.0–52.0)
HEMOGLOBIN: 10.7 g/dL — AB (ref 13.0–17.0)
MCH: 33.5 pg (ref 26.0–34.0)
MCHC: 34.1 g/dL (ref 30.0–36.0)
MCV: 98.4 fL (ref 78.0–100.0)
Platelets: 228 10*3/uL (ref 150–400)
RBC: 3.19 MIL/uL — ABNORMAL LOW (ref 4.22–5.81)
RDW: 12.4 % (ref 11.5–15.5)
WBC: 12 10*3/uL — ABNORMAL HIGH (ref 4.0–10.5)

## 2016-01-02 LAB — CBC WITH DIFFERENTIAL/PLATELET
BASOS ABS: 0.1 10*3/uL (ref 0.0–0.1)
Basophils Relative: 1 %
EOS ABS: 0.8 10*3/uL — AB (ref 0.0–0.7)
EOS PCT: 7 %
HCT: 33.7 % — ABNORMAL LOW (ref 39.0–52.0)
Hemoglobin: 11.4 g/dL — ABNORMAL LOW (ref 13.0–17.0)
Lymphocytes Relative: 21 %
Lymphs Abs: 2.3 10*3/uL (ref 0.7–4.0)
MCH: 32.9 pg (ref 26.0–34.0)
MCHC: 33.8 g/dL (ref 30.0–36.0)
MCV: 97.4 fL (ref 78.0–100.0)
Monocytes Absolute: 1 10*3/uL (ref 0.1–1.0)
Monocytes Relative: 9 %
Neutro Abs: 6.8 10*3/uL (ref 1.7–7.7)
Neutrophils Relative %: 62 %
PLATELETS: 258 10*3/uL (ref 150–400)
RBC: 3.46 MIL/uL — AB (ref 4.22–5.81)
RDW: 12.4 % (ref 11.5–15.5)
WBC: 11 10*3/uL — AB (ref 4.0–10.5)

## 2016-01-02 LAB — I-STAT TROPONIN, ED: TROPONIN I, POC: 0.03 ng/mL (ref 0.00–0.08)

## 2016-01-02 LAB — GLUCOSE, CAPILLARY: Glucose-Capillary: 143 mg/dL — ABNORMAL HIGH (ref 65–99)

## 2016-01-02 MED ORDER — SODIUM CHLORIDE 0.9% FLUSH
3.0000 mL | Freq: Two times a day (BID) | INTRAVENOUS | Status: DC
  Administered 2016-01-02 – 2016-01-03 (×3): 3 mL via INTRAVENOUS

## 2016-01-02 MED ORDER — ACETAMINOPHEN 325 MG PO TABS: 650.0000 mg | ORAL_TABLET | Freq: Four times a day (QID) | ORAL | Status: DC | PRN

## 2016-01-02 MED ORDER — LORAZEPAM 2 MG/ML IJ SOLN: 1.0000 mg | Freq: Four times a day (QID) | INTRAMUSCULAR | Status: DC | PRN

## 2016-01-02 MED ORDER — LIDOCAINE HCL (PF) 1 % IJ SOLN
30.0000 mL | Freq: Once | INTRAMUSCULAR | Status: AC
  Administered 2016-01-02: 30 mL via INTRADERMAL

## 2016-01-02 MED ORDER — ACETAMINOPHEN 650 MG RE SUPP: 650.0000 mg | Freq: Four times a day (QID) | RECTAL | Status: DC | PRN

## 2016-01-02 MED ORDER — FENTANYL CITRATE (PF) 100 MCG/2ML IJ SOLN
50.0000 ug | Freq: Once | INTRAMUSCULAR | Status: AC
  Administered 2016-01-02: 50 ug via INTRAVENOUS
  Filled 2016-01-02: qty 2

## 2016-01-02 MED ORDER — LORAZEPAM 1 MG PO TABS: 1.0000 mg | ORAL_TABLET | Freq: Four times a day (QID) | ORAL | Status: DC | PRN

## 2016-01-02 MED ORDER — ROSUVASTATIN CALCIUM 20 MG PO TABS
20.0000 mg | ORAL_TABLET | Freq: Every day | ORAL | Status: DC
  Administered 2016-01-02 – 2016-01-03 (×2): 20 mg via ORAL
  Filled 2016-01-02 (×3): qty 1

## 2016-01-02 MED ORDER — VITAMIN B-1 100 MG PO TABS
100.0000 mg | ORAL_TABLET | Freq: Every day | ORAL | Status: DC
  Administered 2016-01-02 – 2016-01-03 (×2): 100 mg via ORAL
  Filled 2016-01-02 (×2): qty 1

## 2016-01-02 MED ORDER — ADULT MULTIVITAMIN W/MINERALS CH
1.0000 | ORAL_TABLET | Freq: Every day | ORAL | Status: DC
  Administered 2016-01-02 – 2016-01-03 (×2): 1 via ORAL
  Filled 2016-01-02 (×2): qty 1

## 2016-01-02 MED ORDER — HYDROMORPHONE HCL 1 MG/ML IJ SOLN: 0.5000 mg | INTRAMUSCULAR | Status: DC | PRN

## 2016-01-02 MED ORDER — LIDOCAINE-EPINEPHRINE-TETRACAINE (LET) SOLUTION
3.0000 mL | Freq: Once | NASAL | Status: AC
  Administered 2016-01-02: 3 mL via TOPICAL
  Filled 2016-01-02: qty 3

## 2016-01-02 MED ORDER — METOPROLOL TARTRATE 50 MG PO TABS
50.0000 mg | ORAL_TABLET | Freq: Every day | ORAL | Status: DC
  Administered 2016-01-02 – 2016-01-03 (×2): 50 mg via ORAL
  Filled 2016-01-02 (×2): qty 1

## 2016-01-02 MED ORDER — IRBESARTAN 300 MG PO TABS
150.0000 mg | ORAL_TABLET | Freq: Every day | ORAL | Status: DC
  Administered 2016-01-02 – 2016-01-03 (×2): 150 mg via ORAL
  Filled 2016-01-02 (×2): qty 1

## 2016-01-02 MED ORDER — ALBUTEROL SULFATE (2.5 MG/3ML) 0.083% IN NEBU
2.5000 mg | INHALATION_SOLUTION | Freq: Once | RESPIRATORY_TRACT | Status: AC
  Administered 2016-01-02: 2.5 mg via RESPIRATORY_TRACT
  Filled 2016-01-02: qty 3

## 2016-01-02 MED ORDER — THIAMINE HCL 100 MG/ML IJ SOLN: 100.0000 mg | Freq: Every day | INTRAMUSCULAR | Status: DC

## 2016-01-02 MED ORDER — ALBUTEROL SULFATE (2.5 MG/3ML) 0.083% IN NEBU
2.5000 mg | INHALATION_SOLUTION | RESPIRATORY_TRACT | Status: DC | PRN
  Administered 2016-01-02 – 2016-01-03 (×4): 2.5 mg via RESPIRATORY_TRACT
  Filled 2016-01-02 (×4): qty 3

## 2016-01-02 MED ORDER — HYDROCODONE-ACETAMINOPHEN 5-325 MG PO TABS
1.0000 | ORAL_TABLET | ORAL | Status: DC | PRN
  Administered 2016-01-02: 2 via ORAL
  Filled 2016-01-02: qty 2

## 2016-01-02 MED ORDER — FOLIC ACID 1 MG PO TABS
1.0000 mg | ORAL_TABLET | Freq: Every day | ORAL | Status: DC
  Administered 2016-01-02 – 2016-01-03 (×2): 1 mg via ORAL
  Filled 2016-01-02 (×2): qty 1

## 2016-01-02 MED ORDER — PANTOPRAZOLE SODIUM 40 MG PO TBEC
40.0000 mg | DELAYED_RELEASE_TABLET | Freq: Every day | ORAL | Status: DC
  Administered 2016-01-02 – 2016-01-03 (×2): 40 mg via ORAL
  Filled 2016-01-02 (×2): qty 1

## 2016-01-02 NOTE — ED Notes (Signed)
MD at bedside.5 staples in head

## 2016-01-02 NOTE — ED Notes (Signed)
Attempted to call report to 5W 

## 2016-01-02 NOTE — Progress Notes (Signed)
xrays reviewed  New acromial fracture on left  Needs sling immobilization for at least 3 w  - I can see patient later but have multiple call cases posted

## 2016-01-02 NOTE — ED Provider Notes (Signed)
LACERATION REPAIR Performed by: Sharyon Cable Consent: Verbal consent obtained. Risks and benefits: risks, benefits and alternatives were discussed Patient identity confirmed: provided demographic data Time out performed prior to procedure Prepped and Draped in normal sterile fashion Wound explored Laceration Location: scalp Laceration Length: 3cm No Foreign Bodies seen or palpated Anesthesia: local infiltration Local anesthetic: lidocaine  Anesthetic total: 3 ml Irrigation method: syringe Amount of cleaning: standard Skin closure: simple Number of sutures or staples: 5 staples Technique: staples Patient tolerance: Patient tolerated the procedure well with no immediate complications.   Pt with continued pain in left shoulder but he is able to range the left shoulder and he is able to abduct the shoulder He has significant soft tissue swelling of left clavicle shoulder He will be admitted, but will also order CT left shoulder while awaiting admit    Ripley Fraise, MD 01/02/16 [phone]

## 2016-01-02 NOTE — ED Provider Notes (Signed)
Pt here s/p syncopal event He has scalp laceration He has left shoulder tenderness He has wheezing Imaging ordered Pacemaker interrogated - no acute events noted, pacemaker is functioning properly    Ripley Fraise, MD 01/02/16 0037

## 2016-01-02 NOTE — Progress Notes (Signed)
Patient seen and evaluated, chart reviewed, please see EMR for updated orders. Please see full H&P dictated by admitting physician for same date of service.    Plan of care discussed with wife at bedside, also Discussed with on-call Orthopedic surgeon  PT evaluation appreciated, sling ordered  Patient seen and evaluated, chart reviewed, please see EMR for updated orders. Please see full H&P dictated by admitting physician for same date of service.

## 2016-01-02 NOTE — Evaluation (Signed)
Physical Therapy Evaluation Patient Details Name: Clinton Hughes MRN: [id number] DOB: January 27, 1926 Today's Date: 01/02/2016   History of Present Illness  pt is an 82 y/o male with pmh significant for pacer, symtomatic orthostatic hypotension, alcohol use disorder, presenting with fall and claviclar fracture.  Clinical Impression  Pt admitted with/for fall with clavicular fx.  Pt currently limited functionally due to the problems listed below.  (see problems list.)  Pt will benefit from PT to maximize function and safety to be able to get home safely with available assist of family.     Follow Up Recommendations Home health PT;Other (comment) (or OPPT)    Equipment Recommendations  None recommended by PT    Recommendations for Other Services       Precautions / Restrictions Precautions Precautions: Fall Required Braces or Orthoses:  (Asked for sling order)      Mobility  Bed Mobility Overal bed mobility: Needs Assistance Bed Mobility: Supine to Sit     Supine to sit: Mod assist     General bed mobility comments: up via R elbow.  Stability at L shoulder; assist up due to pain.  Transfers Overall transfer level: Needs assistance Equipment used: 1 person hand held assist Transfers: Sit to/from [facility] Sit to Stand: Min assist Stand pivot transfers: Min assist       General transfer comment: more stability at L shoulder/arm than anything  Ambulation/Gait Ambulation/Gait assistance: Min assist;Mod assist;+2 safety/equipment Ambulation Distance (Feet): 10 Feet (then additional 5 feet) Assistive device: 1 person hand held assist Gait Pattern/deviations: Step-through pattern   Gait velocity interpretation: Below normal speed for age/gender General Gait Details: pt needed stability assist and stability at L UE  Stairs            Wheelchair Mobility    Modified Rankin (Stroke Patients Only)       Balance Overall balance assessment:  Needs assistance Sitting-balance support: No upper extremity supported Sitting balance-Leahy Scale: Fair     Standing balance support: Single extremity supported;No upper extremity supported Standing balance-Leahy Scale: Fair                               Pertinent Vitals/Pain Pain Assessment: Faces Pain Score: 8  Faces Pain Scale: Hurts whole lot Pain Location: L shoulder/traps Pain Descriptors / Indicators: Aching;Sharp;Sore Pain Intervention(s): Monitored during session;Limited activity within patient's tolerance;Repositioned    Home Living Family/patient expects to be discharged to:: Unsure Living Arrangements: Spouse/significant other Available Help at Discharge: Available 24 hours/day;Family Type of Home: House Home Access: Stairs to enter Entrance Stairs-Rails: Psychiatric nurse of Steps: several Home Layout: One level Home Equipment: Walker - 4 wheels;Cane - single point;Shower seat      Prior Function Level of Independence: Independent with assistive device(s);Needs assistance   Gait / Transfers Assistance Needed: uses cane in home days and RW at night to BR  ADL's / Homemaking Assistance Needed: needs some assist transitioning into shower and some wash up assist --back and feet.        Hand Dominance        Extremity/Trunk Assessment   Upper Extremity Assessment: Defer to OT evaluation (unable to move L shd appreciably without pain)           Lower Extremity Assessment: Generalized weakness;Overall [facility] for tasks assessed (weakness bil hip flexion and hams with pain in shd.)         Communication  Communication: No difficulties  Cognition Arousal/Alertness: Awake/alert Behavior During Therapy: WFL for tasks assessed/performed Overall Cognitive Status: Within Functional Limits for tasks assessed                      General Comments      Exercises        Assessment/Plan    PT Assessment Patient  needs continued PT services  PT Diagnosis Difficulty walking;Generalized weakness;Acute pain   PT Problem List Decreased strength;Decreased activity tolerance;Decreased balance;Decreased mobility;Pain  PT Treatment Interventions Gait training;DME instruction;Functional mobility training;Therapeutic activities;Balance training;Patient/family education   PT Goals (Current goals can be found in the Care Plan section) Acute Rehab PT Goals Patient Stated Goal: Independent mobility with L shoulder immobilized PT Goal Formulation: With patient Time For Goal Achievement: 01/16/16 Potential to Achieve Goals: Good    Frequency Min 3X/week   Barriers to discharge        Co-evaluation               End of Session Equipment Utilized During Treatment: Other (comment) (hopefully getting a sling) Activity Tolerance: Patient limited by pain Patient left: in chair;with call bell/phone within reach;with family/visitor present Nurse Communication: Mobility status    Functional Assessment Tool Used: clinical judgement Functional Limitation: Mobility: Walking and moving around Mobility: Walking and Moving Around Current Status [phone]): At least 20 percent but less than 40 percent impaired, limited or restricted Mobility: Walking and Moving Around Goal Status [phone]): At least 1 percent but less than 20 percent impaired, limited or restricted    Time: [id number] PT Time Calculation (min) (ACUTE ONLY): 50 min   Charges:   PT Evaluation $PT Eval Moderate Complexity: 1 Procedure PT Treatments $Gait Training: 8-22 mins $Therapeutic Activity: 8-22 mins   PT G Codes:   PT G-Codes **NOT FOR INPATIENT CLASS** Functional Assessment Tool Used: clinical judgement Functional Limitation: Mobility: Walking and moving around Mobility: Walking and Moving Around Current Status [id number]): At least 20 percent but less than 40 percent impaired, limited or restricted Mobility: Walking and Moving Around Goal  Status [phone]): At least 1 percent but less than 20 percent impaired, limited or restricted    Dawana Asper, Tessie Fass 01/02/2016, 11:18 AM 01/02/2016  Donnella Sham, PT [phone]  (pager)

## 2016-01-02 NOTE — Care Management Obs Status (Signed)
[place] NOTIFICATION   Patient Details  Name: Clinton Hughes MRN: [id number] Date of Birth: [birth year]   Medicare Observation Status Notification Given:  Yes    Sharin Mons, RN 01/02/2016, 12:52 PM

## 2016-01-02 NOTE — Progress Notes (Signed)
Patient requested and Dr. Maurene Capes recommended hospital bed, gait belt and bedside commode at discharge for home. Will notify on coming nurse for follow-up. Denton Brick, MD verbalized he will order items at discharge.

## 2016-01-02 NOTE — ED Notes (Signed)
Pt taken to CT.

## 2016-01-02 NOTE — Progress Notes (Signed)
Orthopedic Tech Progress Note Patient Details:  BYREN AST [birth year] [id number]  Ortho Devices Type of Ortho Device: Arm sling Ortho Device/Splint Interventions: Application   [place] Pink 01/02/2016, 3:51 PM

## 2016-01-02 NOTE — H&P (Signed)
History and Physical  Patient Name: Clinton Hughes     [id number]    DOB: 1925/09/23    DOA: 01/01/2016 PCP:  Melinda Crutch, MD   Patient coming from: Home  Chief Complaint: Fall  HPI: Clinton Hughes is a 82 y.o. male with a past medical history significant for CHB with pacer, chronic symtomatic orthostatic hypotension, alcohol use disorder, and hyponatremia who presents with fall and clavicle fracture.  The patient was in his usual state of health until tonight around 10:30 PM. He was sitting on the couch, got up and walked a few feet, had no preceding dizziness, head rash, feeling of warmth, palpitations, or chest pain, but passed out.  Wife heard him fall from the other room, and found him with a head laceration and left shoulder pain.  He was groggy and she called EMS, who found him with normal vitals and transported to the ER.  ED course: -Afebrile, heart rate 90s, respirations and BP normal, SpO2 normal -Na 127 (baseline 130), K 4.5, Cr 0.94, WBC 11K, Hgb 11.4 -Troponin negative, ECG showed paced rhythm only -CT head without finding, CXR and shoulder radiographs also negative -He had marked swelling over the left clavicle and so a CT shoulder was obtained that showed fractured clavicle  He has had chronic orthostatic symptoms for years, for which he was followed for a time by Neurology, tried abdominal binders and TED hose, and was instructed on standing still briefly after rising from a chair before walking.  He does not use a walker.  He does however have about 5-7 glasses of wine per day, starting after his morning coffee.  He also has a pacer for complete heart block found after a syncopal episode 2 years ago.     ROS: Review of Systems  Constitutional: Negative for fever and malaise/fatigue.  Cardiovascular: Negative for chest pain, palpitations and claudication.  Neurological: Positive for loss of consciousness. Negative for dizziness, tingling, tremors, speech change,  focal weakness, seizures and weakness.  All other systems reviewed and are negative.       Past Medical History:  Diagnosis Date  . Coronary artery disease   . GERD (gastroesophageal reflux disease)   . H/O hiatal hernia   . High cholesterol   . Hypertensive heart disease   . Lumbar disc disease   . Movement disorder   . PFO (patent foramen ovale)   . Pneumonia 2014 X1  . Pneumonia and influenza 2013 X2  . Seizures ([place])   . TIA (transient ischemic attack) 03/02/2013   Presumed TIA. Hx of speech arrest October 2014. EEG neg    Past Surgical History:  Procedure Laterality Date  . BACK SURGERY    . CATARACT EXTRACTION W/ INTRAOCULAR LENS  IMPLANT, BILATERAL Bilateral 2007-2010  . COLONOSCOPY  2006/2008  . CORONARY ARTERY BYPASS GRAFT  1995   "CABG X5" (02/14/2013)  . LUMBAR LAMINECTOMY  1980   "L5?" (02/14/2013)  . PACEMAKER INSERTION  08/14/13   MDT Adapta L implanted by Dr Rayann Heman for complete heart blcok  . PERMANENT PACEMAKER INSERTION N/A 08/14/2013   Procedure: PERMANENT PACEMAKER INSERTION;  Surgeon: Coralyn Mark, MD;  Location: [facility];  Service: Cardiovascular;  Laterality: N/A;  . REFRACTIVE SURGERY Right ~ 02/07/2013   "to clear a couple clouds off" (02/14/2013)  . TEE WITHOUT CARDIOVERSION N/A 03/08/2013   Procedure: TRANSESOPHAGEAL ECHOCARDIOGRAM (TEE);  Surgeon: Fay Records, MD;  Location: [facility];  Service: Cardiovascular;  Laterality: N/A;  Social History: Patient lives with his wife.  The patient walks with a cane, has a walker that he uses at night when going to the bathroom.  He is not a smoker.  He does drink alcohol.    No Known Allergies  Family history: family history includes Epilepsy in his grandchild; Sleep walking in his daughter; Thyroid disease in his other and sister.  Prior to Admission medications   Medication Sig Start Date End Date Taking? Authorizing Provider  ADVAIR DISKUS 250-50 MCG/DOSE AEPB Inhale 1 puff into the lungs 2  (two) times daily. 07/08/14   Historical Provider, MD  clopidogrel (PLAVIX) 75 MG tablet TAKE 1 TABLET BY MOUTH EVERY DAY 06/02/14   Asencion Partridge Dohmeier, MD  fluticasone (FLONASE) 50 MCG/ACT nasal spray Place 2 sprays into both nostrils as needed. 11/05/13   Historical Provider, MD  isosorbide mononitrate (IMDUR) 60 MG 24 hr tablet Take 60 mg by mouth daily. 08/26/14   Historical Provider, MD  metoprolol (LOPRESSOR) 50 MG tablet Take 50 mg by mouth daily.     Historical Provider, MD  montelukast (SINGULAIR) 10 MG tablet Take 1 tablet by mouth every evening. 12/01/13   Historical Provider, MD  omeprazole (PRILOSEC) 40 MG capsule Take 40 mg by mouth daily.    Historical Provider, MD  rosuvastatin (CRESTOR) 20 MG tablet Take 20 mg by mouth daily.    Historical Provider, MD  valsartan (DIOVAN) 160 MG tablet Take 80 mg by mouth daily.     Historical Provider, MD       Physical Exam: BP 127/59   Pulse 103   Temp 97.9 F (36.6 C) (Oral)   Resp 18   SpO2 93%  General appearance: Well-developed, elderly adult male, alert but tired and weak and in no acute distress.   Eyes: Anicteric, conjunctiva pink, lids and lashes normal.     ENT: No nasal deformity, discharge, or epistaxis.  OP moist without lesions.   Lymph: No cervical or supraclavicular lymphadenopathy. Skin: Warm and dry.  No jaundice.  No suspicious rashes or lesions.  There is a large soft tissue swelling over the left clavicle. Cardiac: RRR, nl S1-S2, no murmurs appreciated.  JVP normal.  No LE edema.  Radial and DP pulses 2+ and symmetric. Respiratory: Normal respiratory rate and rhythm.  CTAB without rales or wheezes. GI: Abdomen soft without rigidity.  No TTP. No ascites, distension, hepatosplenomegaly.   MSK: No deformities or effusions.  No clubbing/cyanosis. Neuro: Cranial nerves normal.  Sensation intact to light touch.  Cannot move left arm for pain.  Right arm normal strength and legs bilaterally normal strength.  Sensorium intact and  responding to questions, attention normal.  Speech is fluent.   Psych: Affect normal.  Judgment and insight appear normal.       Labs on Admission:  I have personally reviewed following labs and imaging studies: CBC:  Recent Labs Lab 01/02/16 0015  WBC 11.0*  NEUTROABS 6.8  HGB 11.4*  HCT 33.7*  MCV 97.4  PLT 0000000   Basic Metabolic Panel:  Recent Labs Lab 01/02/16 0015  NA 127*  K 4.5  CL 99*  CO2 19*  GLUCOSE 125*  BUN 14  CREATININE 0.94  CALCIUM 9.3   GFR: CrCl cannot be calculated (Unknown ideal weight.).        Radiological Exams on Admission: Personally reviewed: Dg Chest 2 View  Result Date: 01/02/2016 CLINICAL DATA:  Acute onset of left shoulder pain and syncope. Initial encounter. EXAM: CHEST  2  VIEW COMPARISON:  Chest radiograph performed 11/05/2013, and CT of the chest performed 11/15/2013 FINDINGS: The lungs are well-aerated. Mild bilateral atelectasis is noted. There is no evidence of pleural effusion or pneumothorax. The heart is mildly enlarged. The patient is status post median sternotomy. A pacemaker is noted at the left chest wall, with leads ending at the right atrium and right ventricle. No acute osseous abnormalities are seen. IMPRESSION: Mild bilateral atelectasis noted. Lungs otherwise clear. Mild cardiomegaly. No displaced rib fracture seen. Electronically Signed   By: Garald Balding M.D.   On: 01/02/2016 00:58   Ct Head Wo Contrast  Result Date: 01/02/2016 CLINICAL DATA:  Status post fall in hallway. Hit head. Bleeding at the posterior aspect of the head, with large laceration. Initial encounter. Initial encounter. EXAM: CT HEAD WITHOUT CONTRAST TECHNIQUE: Contiguous axial images were obtained from the base of the skull through the vertex without intravenous contrast. COMPARISON:  CT of the head performed 09/20/2014 FINDINGS: Brain: No evidence of acute infarction, hemorrhage, hydrocephalus, extra-axial collection or mass lesion/mass effect.  Prominence of the ventricles and sulci reflects moderate cortical volume loss. Cerebellar atrophy is noted. Scattered periventricular and subcortical white matter change likely reflects small vessel ischemic microangiopathy. The brainstem and fourth ventricle are within normal limits. The basal ganglia are unremarkable in appearance. The cerebral hemispheres demonstrate grossly normal gray-white differentiation. No mass effect or midline shift is seen. Vascular: No hyperdense vessel or unexpected calcification. Skull: There is no evidence of fracture; visualized osseous structures are unremarkable in appearance. Sinuses/Orbits: The orbits are within normal limits. Mucosal thickening is noted at the maxillary sinuses bilaterally. The remaining paranasal sinuses and mastoid air cells are well-aerated. Other: A soft tissue laceration is noted overlying the left posterior parietal calvarium. IMPRESSION: 1. No evidence of traumatic intracranial injury or fracture. 2. Soft tissue laceration overlying the left posterior parietal calvarium. 3. Moderate cortical volume loss and scattered small vessel ischemic microangiopathy. 4. Mucosal thickening at the maxillary sinuses bilaterally. Electronically Signed   By: Garald Balding M.D.   On: 01/02/2016 01:44   Ct Shoulder Left Wo Contrast  Result Date: 01/02/2016 CLINICAL DATA:  Left shoulder and clavicular pain following a fall. EXAM: CT OF THE LEFT SHOULDER WITHOUT CONTRAST TECHNIQUE: Multidetector CT imaging was performed according to the standard protocol. Multiplanar CT image reconstructions were also generated. COMPARISON:  Left shoulder radiographs 01/02/2016 FINDINGS: Acute appearing comminuted fractures of the left acromion with some inferior displacement of the distal fracture fragments. Old ununited fracture of the distal left clavicle. Acute comminuted displaced and distracted fractures of the medial left clavicle. Soft tissue infiltration around the medial  clavicle and around the acromial on consistent with contusion. Moderately large subcoracoid bursal fluid collection. Severe degenerative changes in the left shoulder involving the glenohumeral joint with near complete loss of the joint space and large osteophytes and subcortical cysts present. Heterotopic calcification inferior to the glenohumeral joint. Calcifications in the rotator cuff tendons consistent with calcific tendinitis. Old appearing left anterior rib fracture. IMPRESSION: Acute comminuted fractures of the left acromion and of the medial left clavicle. Associated soft tissue contusions. Old ununited fracture of the distal left clavicle. Severe degenerative changes in the glenohumeral joint. Large subcoracoid bursa. Calcifications in the rotator cuff consistent with calcific tendinitis. Electronically Signed   By: Lucienne Capers M.D.   On: 01/02/2016 04:19   Dg Shoulder Left  Result Date: 01/02/2016 CLINICAL DATA:  Acute onset of left shoulder pain. Initial encounter. EXAM: LEFT SHOULDER - 2+  VIEW COMPARISON:  Chest radiograph performed 11/05/2013 FINDINGS: There is no evidence of acute fracture or dislocation. The left humeral head is seated within the glenoid fossa. A prominent osteophyte is seen arising at the medial aspect of the humeral head. There is chronic nonunion of a fracture at the distal left clavicle. The left acromioclavicular joint is not well assessed. Cortical irregularity at the left acromion may reflect a chronic left acromial fracture. A pacemaker is noted overlying the left chest wall. No displaced rib fractures are seen. No significant soft tissue abnormalities are seen. The visualized portions of the left lung are clear. IMPRESSION: 1. No evidence of acute fracture or dislocation. 2. 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 3. Cortical irregularity of the left acromion may reflect a chronic left acromial fracture. Electronically Signed   By: Garald Balding  M.D.   On: 01/02/2016 01:01    EKG: Independently reviewed. Rate 96, paced rhythm.    Assessment/Plan 1. Clavicle fracture:  -Consult to Orthopedic surgery -PT eval -Acetaminophen, hydrocodone or hydromorphone for pain   2. Alcohol use:  -Counseled to stop -CIWA protocol  3. HTN:  -Continue ARB, metoprolol, statin  4. GERD:  -Continue PPI  5. Orthostasis:  This is chronic. -Ted hose while in the hospital -Recommend decreasing alcohol use  6. Hyponatremia: Chronic, stable.     DVT prophylaxis: Ted hose  Code Status: FULL  Family Communication: Wife at bedside  Disposition Plan: Anticipate PT eval today, and work with PT and Care Management to find appropriate disposition for patient given his shoulder pain Consults called: None Admission status: OBS, med surg   Medical decision making: Patient seen at 3:50 AM on 01/02/2016.  The patient was discussed with Dr. Christy Gentles. What exists of the patient's chart was reviewed in depth.  Clinical condition: stable.        Edwin Dada [facility] Pager [phone]

## 2016-01-02 NOTE — ED Notes (Signed)
Patient transported to X-ray 

## 2016-01-03 DIAGNOSIS — I119 Hypertensive heart disease without heart failure: Secondary | ICD-10-CM | POA: Diagnosis not present

## 2016-01-03 DIAGNOSIS — R55 Syncope and collapse: Secondary | ICD-10-CM | POA: Diagnosis not present

## 2016-01-03 DIAGNOSIS — S42035A Nondisplaced fracture of lateral end of left clavicle, initial encounter for closed fracture: Secondary | ICD-10-CM | POA: Diagnosis not present

## 2016-01-03 LAB — COMPREHENSIVE METABOLIC PANEL
ALBUMIN: 3.3 g/dL — AB (ref 3.5–5.0)
ALK PHOS: 49 U/L (ref 38–126)
ALT: 20 U/L (ref 17–63)
ANION GAP: 9 (ref 5–15)
AST: 31 U/L (ref 15–41)
BILIRUBIN TOTAL: 1 mg/dL (ref 0.3–1.2)
BUN: 12 mg/dL (ref 6–20)
CALCIUM: 8.9 mg/dL (ref 8.9–10.3)
CO2: 20 mmol/L — ABNORMAL LOW (ref 22–32)
CREATININE: 0.96 mg/dL (ref 0.61–1.24)
Chloride: 99 mmol/L — ABNORMAL LOW (ref 101–111)
GFR calc Af Amer: >60 mL/min (ref >60)
GFR calc non Af Amer: >60 mL/min (ref >60)
GLUCOSE: 119 mg/dL — AB (ref 65–99)
Potassium: 4.6 mmol/L (ref 3.5–5.1)
Sodium: 128 mmol/L — ABNORMAL LOW (ref 135–145)
TOTAL PROTEIN: 5.4 g/dL — AB (ref 6.5–8.1)

## 2016-01-03 LAB — GLUCOSE, CAPILLARY
GLUCOSE-CAPILLARY: 124 mg/dL — AB (ref 65–99)
Glucose-Capillary: 136 mg/dL — ABNORMAL HIGH (ref 65–99)

## 2016-01-03 MED ORDER — METOPROLOL TARTRATE 50 MG PO TABS: 50.0000 mg | ORAL_TABLET | Freq: Two times a day (BID) | ORAL | Status: DC

## 2016-01-03 MED ORDER — THIAMINE HCL 100 MG PO TABS: 100.0000 mg | ORAL_TABLET | Freq: Every day | ORAL | 1 refills | Status: AC

## 2016-01-03 MED ORDER — VALSARTAN 80 MG PO TABS: 80.0000 mg | ORAL_TABLET | Freq: Every day | ORAL | 1 refills | Status: AC

## 2016-01-03 MED ORDER — FOLIC ACID 1 MG PO TABS: 1.0000 mg | ORAL_TABLET | Freq: Every day | ORAL | 0 refills | Status: AC

## 2016-01-03 MED ORDER — HYDROCODONE-ACETAMINOPHEN 5-325 MG PO TABS: 1.0000 | ORAL_TABLET | ORAL | 0 refills | Status: AC | PRN

## 2016-01-03 MED ORDER — VITAMIN B-1 100 MG PO TABS: 100.0000 mg | ORAL_TABLET | Freq: Every day | ORAL | 1 refills | Status: AC

## 2016-01-03 MED ORDER — ADULT MULTIVITAMIN W/MINERALS CH: 1.0000 | ORAL_TABLET | Freq: Every day | ORAL | 1 refills | Status: AC

## 2016-01-03 NOTE — Discharge Summary (Addendum)
Clinton Hughes, is a 82 y.o. male  DOB May 31, 1925  MRN [id number].  Admission date:  01/01/2016  Admitting Physician  Edwin Dada, MD  Discharge Date:  01/03/2016   Primary MD   Melinda Crutch, MD  Recommendations for primary care physician for things to follow:   Admission Diagnosis  Wheezing [R06.2] Sprain of left shoulder, initial encounter [S43.402A] Scalp laceration, initial encounter [S01.01XA] Concussion, with loss of consciousness of 30 minutes or less, initial encounter [S06.0X1A] Syncope, unspecified syncope type [R55]   Discharge Diagnosis  Wheezing [R06.2] Sprain of left shoulder, initial encounter [S43.402A] Scalp laceration, initial encounter [S01.01XA] Concussion, with loss of consciousness of 30 minutes or less, initial encounter [S06.0X1A] Syncope, unspecified syncope type [R55]    Principal Problem:   Nondisplaced fracture of lateral end of left clavicle Active Problems:   Alcohol abuse, daily use   Hypertensive heart disease   Orthostatic hypotension   Hyponatremia   Cardiac pacemaker in situ   Complete heart block ([place])   Syncope      Past Medical History:  Diagnosis Date  . Coronary artery disease   . GERD (gastroesophageal reflux disease)   . H/O hiatal hernia   . High cholesterol   . Hypertensive heart disease   . Lumbar disc disease   . Movement disorder   . PFO (patent foramen ovale)   . Pneumonia 2014 X1  . Pneumonia and influenza 2013 X2  . Seizures ([place])   . TIA (transient ischemic attack) 03/02/2013   Presumed TIA. Hx of speech arrest October 2014. EEG neg    Past Surgical History:  Procedure Laterality Date  . BACK SURGERY    . CATARACT EXTRACTION W/ INTRAOCULAR LENS  IMPLANT, BILATERAL Bilateral 2007-2010  . COLONOSCOPY  2006/2008  . CORONARY ARTERY BYPASS GRAFT  1995   "CABG X5" (02/14/2013)  . LUMBAR LAMINECTOMY  1980   "L5?"  (02/14/2013)  . PACEMAKER INSERTION  08/14/13   MDT Adapta L implanted by Dr Rayann Heman for complete heart blcok  . PERMANENT PACEMAKER INSERTION N/A 08/14/2013   Procedure: PERMANENT PACEMAKER INSERTION;  Surgeon: Coralyn Mark, MD;  Location: [facility] CATH LAB;  Service: Cardiovascular;  Laterality: N/A;  . REFRACTIVE SURGERY Right ~ 02/07/2013   "to clear a couple clouds off" (02/14/2013)  . TEE WITHOUT CARDIOVERSION N/A 03/08/2013   Procedure: TRANSESOPHAGEAL ECHOCARDIOGRAM (TEE);  Surgeon: Fay Records, MD;  Location: [facility] ENDOSCOPY;  Service: Cardiovascular;  Laterality: N/A;       HPI  from the history and physical done on the day of admission:   HPI: EMMETTE Hughes is a 82 y.o. male with a past medical history significant for CHB with pacer, chronic symtomatic orthostatic hypotension, alcohol use disorder, and hyponatremia who presents with fall and clavicle fracture.  The patient was in his usual state of health until tonight around 10:30 PM. He was sitting on the couch, got up and walked a few feet, had no preceding dizziness, head rash, feeling of warmth, palpitations, or chest pain,  but passed out.  Wife heard him fall from the other room, and found him with a head laceration and left shoulder pain.  He was groggy and she called EMS, who found him with normal vitals and transported to the ER.  ED course: -Afebrile, heart rate 90s, respirations and BP normal, SpO2 normal -Na 127 (baseline 130), K 4.5, Cr 0.94, WBC 11K, Hgb 11.4 -Troponin negative, ECG showed paced rhythm only -CT head without finding, CXR and shoulder radiographs also negative -He had marked swelling over the left clavicle and so a CT shoulder was obtained that showed fractured clavicle  He has had chronic orthostatic symptoms for years, for which he was followed for a time by Neurology, tried abdominal binders and TED hose, and was instructed on standing still briefly after rising from a chair before walking.  He does  not use a walker.  He does however have about 5-7 glasses of wine per day, starting after his morning coffee.  He also has a pacer for complete heart block found after a syncopal episode 2 years ago.      Hospital Course:    1)Left acromion and left clavicular fracture- Orthopedic consult from Dr. Marcene Duos appreciated, repeat x-rays in 3 weeks as outpatient advised, continue home physical therapy, hydrocodone when necessary pain. Avoid nonsteroidal anti-inflammatory agents as patient takes full dose aspirin with risk for GI bleed  2)Recurrent falls/dizziness with gait problems-this has been an ongoing problem for a while, physical therapy may help. Patient has had extensive workup/eval in the past according to wife and family. Alcohol use may be contributing to his falls   3)HTN-stable, continue Valsartan 80 mg daily, metoprolol 50 mg daily  4)Etoh Abuse- patient apparently uses alcohol in excess, according cessation advised, folic acid and Thiamine as ordered. Alcohol use may be contributing to his falls  5)Leukocytosis- most likely reactive, no evidence of acute infection identified at this time  6)Patient had a 13 beat of nonsustained V. Tach-he was asymptomatic, I initially discontinued his discharge home today. I paged and spoke with Dr. Thompson Grayer from [facility] cardiology who reviewed patient's chart including echocardiogram from 2016. Dr. Thompson Grayer advised discharge home from a cardiologist/cardia vascular standpoint with outpatient follow-up if needed  Discharge Condition: stable  Follow UP  Follow-up Information     Melinda Crutch, MD. Schedule an appointment as soon as possible for a visit in 2 week(s).   Specialty:  Family Medicine Contact information: [place] [address] [phone]            Consults obtained - ortho  Diet and Activity recommendation:  As advised  Discharge Instructions     Discharge Instructions    Call MD for:   persistant dizziness or light-headedness    Complete by:  As directed   Call MD for:  persistant nausea and vomiting    Complete by:  As directed   Call MD for:  temperature >100.4    Complete by:  As directed   Diet - low sodium heart healthy    Complete by:  As directed   Discharge instructions    Complete by:  As directed   Take medications as prescribed, avoid taking ibuprofen or Aleve while taking food dose aspirin Home health physical therapy as ordered Stand up slowly, get some help to avoid falling   Driving Restrictions    Complete by:  As directed   No driving while taking pain medications and until left shoulder heals  Increase activity slowly    Complete by:  As directed   Lifting restrictions    Complete by:  As directed   Left acromion and left clavicular fracture.... No lifting with left upper extremity, may lift with right arm.  Recurrent falls/dizziness with gait problems        Discharge Medications       Medication List    TAKE these medications   albuterol 108 (90 Base) MCG/ACT inhaler Commonly known as:  PROVENTIL HFA;VENTOLIN HFA Inhale 1-2 puffs into the lungs every 6 (six) hours as needed for wheezing or shortness of breath.   CRESTOR 20 MG tablet Generic drug:  rosuvastatin Take 20 mg by mouth daily.   folic acid 1 MG tablet Commonly known as:  FOLVITE Take 1 tablet (1 mg total) by mouth daily.   HYDROcodone-acetaminophen 5-325 MG tablet Commonly known as:  NORCO/VICODIN Take 1-2 tablets by mouth every 4 (four) hours as needed for moderate pain.   metoprolol 50 MG tablet Commonly known as:  LOPRESSOR Take 50 mg by mouth 2 (two) times daily.   multivitamin with minerals Tabs tablet Take 1 tablet by mouth daily.   omeprazole 40 MG capsule Commonly known as:  PRILOSEC Take 40 mg by mouth daily.   thiamine 100 MG tablet Take 1 tablet (100 mg total) by mouth daily.   thiamine 100 MG tablet Commonly known as:  VITAMIN B-1 Take 1 tablet (100  mg total) by mouth daily.   valsartan 80 MG tablet Commonly known as:  DIOVAN Take 1 tablet (80 mg total) by mouth daily. What changed:  medication strength       Major procedures and Radiology Reports - PLEASE review detailed and final reports for all details, in brief -   Dg Chest 2 View  Result Date: 01/02/2016 CLINICAL DATA:  Acute onset of left shoulder pain and syncope. Initial encounter. EXAM: CHEST  2 VIEW COMPARISON:  Chest radiograph performed 11/05/2013, and CT of the chest performed 11/15/2013 FINDINGS: The lungs are well-aerated. Mild bilateral atelectasis is noted. There is no evidence of pleural effusion or pneumothorax. The heart is mildly enlarged. The patient is status post median sternotomy. A pacemaker is noted at the left chest wall, with leads ending at the right atrium and right ventricle. No acute osseous abnormalities are seen. IMPRESSION: Mild bilateral atelectasis noted. Lungs otherwise clear. Mild cardiomegaly. No displaced rib fracture seen. Electronically Signed   By: Garald Balding M.D.   On: 01/02/2016 00:58   Ct Head Wo Contrast  Result Date: 01/02/2016 CLINICAL DATA:  Status post fall in hallway. Hit head. Bleeding at the posterior aspect of the head, with large laceration. Initial encounter. Initial encounter. EXAM: CT HEAD WITHOUT CONTRAST TECHNIQUE: Contiguous axial images were obtained from the base of the skull through the vertex without intravenous contrast. COMPARISON:  CT of the head performed 09/20/2014 FINDINGS: Brain: No evidence of acute infarction, hemorrhage, hydrocephalus, extra-axial collection or mass lesion/mass effect. Prominence of the ventricles and sulci reflects moderate cortical volume loss. Cerebellar atrophy is noted. Scattered periventricular and subcortical white matter change likely reflects small vessel ischemic microangiopathy. The brainstem and fourth ventricle are within normal limits. The basal ganglia are unremarkable in  appearance. The cerebral hemispheres demonstrate grossly normal gray-white differentiation. No mass effect or midline shift is seen. Vascular: No hyperdense vessel or unexpected calcification. Skull: There is no evidence of fracture; visualized osseous structures are unremarkable in appearance. Sinuses/Orbits: The orbits are within normal limits. Mucosal thickening  is noted at the maxillary sinuses bilaterally. The remaining paranasal sinuses and mastoid air cells are well-aerated. Other: A soft tissue laceration is noted overlying the left posterior parietal calvarium. IMPRESSION: 1. No evidence of traumatic intracranial injury or fracture. 2. Soft tissue laceration overlying the left posterior parietal calvarium. 3. Moderate cortical volume loss and scattered small vessel ischemic microangiopathy. 4. Mucosal thickening at the maxillary sinuses bilaterally. Electronically Signed   By: Garald Balding M.D.   On: 01/02/2016 01:44   Ct Shoulder Left Wo Contrast  Result Date: 01/02/2016 CLINICAL DATA:  Left shoulder and clavicular pain following a fall. EXAM: CT OF THE LEFT SHOULDER WITHOUT CONTRAST TECHNIQUE: Multidetector CT imaging was performed according to the standard protocol. Multiplanar CT image reconstructions were also generated. COMPARISON:  Left shoulder radiographs 01/02/2016 FINDINGS: Acute appearing comminuted fractures of the left acromion with some inferior displacement of the distal fracture fragments. Old ununited fracture of the distal left clavicle. Acute comminuted displaced and distracted fractures of the medial left clavicle. Soft tissue infiltration around the medial clavicle and around the acromial on consistent with contusion. Moderately large subcoracoid bursal fluid collection. Severe degenerative changes in the left shoulder involving the glenohumeral joint with near complete loss of the joint space and large osteophytes and subcortical cysts present. Heterotopic calcification inferior  to the glenohumeral joint. Calcifications in the rotator cuff tendons consistent with calcific tendinitis. Old appearing left anterior rib fracture. IMPRESSION: Acute comminuted fractures of the left acromion and of the medial left clavicle. Associated soft tissue contusions. Old ununited fracture of the distal left clavicle. Severe degenerative changes in the glenohumeral joint. Large subcoracoid bursa. Calcifications in the rotator cuff consistent with calcific tendinitis. Electronically Signed   By: Lucienne Capers M.D.   On: 01/02/2016 04:19   Dg Shoulder Left  Result Date: 01/02/2016 CLINICAL DATA:  Acute onset of left shoulder pain. Initial encounter. EXAM: LEFT SHOULDER - 2+ VIEW COMPARISON:  Chest radiograph performed 11/05/2013 FINDINGS: There is no evidence of acute fracture or dislocation. The left humeral head is seated within the glenoid fossa. A prominent osteophyte is seen arising at the medial aspect of the humeral head. There is chronic nonunion of a fracture at the distal left clavicle. The left acromioclavicular joint is not well assessed. Cortical irregularity at the left acromion may reflect a chronic left acromial fracture. A pacemaker is noted overlying the left chest wall. No displaced rib fractures are seen. No significant soft tissue abnormalities are seen. The visualized portions of the left lung are clear. IMPRESSION: 1. No evidence of acute fracture or dislocation. 2. 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 3. Cortical irregularity of the left acromion may reflect a chronic left acromial fracture. Electronically Signed   By: Garald Balding M.D.   On: 01/02/2016 01:01    Micro Results   No results found for this or any previous visit (from the past 240 hour(s)).     Today   Subjective    Grace Depiano today has no New Complaints       . Patient's wife and daughter at bedside, questions answered, patient continues to have left upper extremity pain  with range of motion   Patient has been seen and examined prior to discharge   Objective   Blood pressure (!) 146/54, pulse 78, temperature 98 F (36.7 C), resp. rate 18, height 5\' 9"  (1.753 m), weight 86.5 kg (190 lb 11.2 oz), SpO2 96 %.   Intake/Output Summary (Last 24  hours) at 01/03/16 1522 Last data filed at 01/03/16 1349  Gross per 24 hour  Intake              360 ml  Output                0 ml  Net              360 ml    Exam Gen:- Awake  , in no apparent distress  HEENT:- [place].AT,   Neck-Supple Neck,No JVD,  Lungs- mostly clear  CV- S1, S2 normal Abd-  +ve B.Sounds, Abd Soft, No tenderness,    Extremity/Skin:- Intact peripheral pulses  , Lt UE in a Sling, good cap refill, no neurovascular compromise   Data Review   CBC w Diff: Lab Results  Component Value Date   WBC 12.0 (H) 01/02/2016   HGB 10.7 (L) 01/02/2016   HCT 31.4 (L) 01/02/2016   PLT 228 01/02/2016   LYMPHOPCT 21 01/02/2016   MONOPCT 9 01/02/2016   EOSPCT 7 01/02/2016   BASOPCT 1 01/02/2016    CMP: Lab Results  Component Value Date   NA 131 (L) 01/02/2016   K 4.8 01/02/2016   CL 99 (L) 01/02/2016   CO2 23 01/02/2016   BUN 13 01/02/2016   CREATININE 0.92 01/02/2016   PROT 6.6 09/18/2014   ALBUMIN 4.0 09/18/2014   BILITOT 0.8 09/18/2014   ALKPHOS 104 09/18/2014   AST 36 09/18/2014   ALT 26 09/18/2014  .   Total Discharge time is about 33 minutes  Ledon Weihe M.D on 01/03/2016 at 3:22 PM  [facility]   Office  [phone]  Dragon dictation system was used to create this note, attempts have been made to correct errors, however presence of uncorrected errors is not a reflection quality of care provided

## 2016-01-03 NOTE — Progress Notes (Signed)
Pt refused ted hose. MD notified.

## 2016-01-03 NOTE — Progress Notes (Signed)
Patient had 13 beat run v-tach. Dr notified. discharge held. Joslyn Hy, MSN, RN, [employer]

## 2016-01-03 NOTE — Consult Note (Signed)
Reason for Consult: Left shoulder pain Referring Physician: Dr.emokpae    Clinton Hughes is an 82 y.o. male.  HPI: Clinton Hughes is an 82 year old patient with left shoulder pain.  Sustained a fall 2 days ago and landed on his left shoulder.  Denies in the way of loss of consciousness or any other orthopedic complaints.  Does report dislocating that shoulder about 30 years ago and has had trouble since then mostly with pain but not recurrent instability.  Past Medical History:  Diagnosis Date  . Coronary artery disease   . GERD (gastroesophageal reflux disease)   . H/O hiatal hernia   . High cholesterol   . Hypertensive heart disease   . Lumbar disc disease   . Movement disorder   . PFO (patent foramen ovale)   . Pneumonia 2014 X1  . Pneumonia and influenza 2013 X2  . Seizures ([place])   . TIA (transient ischemic attack) 03/02/2013   Presumed TIA. Hx of speech arrest October 2014. EEG neg    Past Surgical History:  Procedure Laterality Date  . BACK SURGERY    . CATARACT EXTRACTION W/ INTRAOCULAR LENS  IMPLANT, BILATERAL Bilateral 2007-2010  . COLONOSCOPY  2006/2008  . CORONARY ARTERY BYPASS GRAFT  1995   "CABG X5" (02/14/2013)  . LUMBAR LAMINECTOMY  1980   "L5?" (02/14/2013)  . PACEMAKER INSERTION  08/14/13   MDT Adapta L implanted by Dr Rayann Heman for complete heart blcok  . PERMANENT PACEMAKER INSERTION N/A 08/14/2013   Procedure: PERMANENT PACEMAKER INSERTION;  Surgeon: Coralyn Mark, MD;  Location: [facility];  Service: Cardiovascular;  Laterality: N/A;  . REFRACTIVE SURGERY Right ~ 02/07/2013   "to clear a couple clouds off" (02/14/2013)  . TEE WITHOUT CARDIOVERSION N/A 03/08/2013   Procedure: TRANSESOPHAGEAL ECHOCARDIOGRAM (TEE);  Surgeon: Fay Records, MD;  Location: [facility];  Service: Cardiovascular;  Laterality: N/A;    Family History  Problem Relation Age of Onset  . Thyroid disease Sister   . Thyroid disease Other   . Epilepsy Grandchild   . Sleep walking Daughter      Social History:  reports that he has quit smoking. His smoking use included Cigarettes. He has a 1.50 pack-year smoking history. He has never used smokeless tobacco. He reports that he drinks alcohol. He reports that he does not use drugs.  Allergies: No Known Allergies  Medications: I have reviewed the patient's current medications.  Results for orders placed or performed during the hospital encounter of 01/01/16 (from the past 48 hour(s))  CBC with Differential     Status: Abnormal   Collection Time: 01/02/16 12:15 AM  Result Value Ref Range   WBC 11.0 (H) 4.0 - 10.5 K/uL   RBC 3.46 (L) 4.22 - 5.81 MIL/uL   Hemoglobin 11.4 (L) 13.0 - 17.0 g/dL   HCT 33.7 (L) 39.0 - 52.0 %   MCV 97.4 78.0 - 100.0 fL   MCH 32.9 26.0 - 34.0 pg   MCHC 33.8 30.0 - 36.0 g/dL   RDW 12.4 11.5 - 15.5 %   Platelets 258 150 - 400 K/uL   Neutrophils Relative % 62 %   Neutro Abs 6.8 1.7 - 7.7 K/uL   Lymphocytes Relative 21 %   Lymphs Abs 2.3 0.7 - 4.0 K/uL   Monocytes Relative 9 %   Monocytes Absolute 1.0 0.1 - 1.0 K/uL   Eosinophils Relative 7 %   Eosinophils Absolute 0.8 (H) 0.0 - 0.7 K/uL   Basophils Relative 1 %  Basophils Absolute 0.1 0.0 - 0.1 K/uL  Basic metabolic panel     Status: Abnormal   Collection Time: 01/02/16 12:15 AM  Result Value Ref Range   Sodium 127 (L) 135 - 145 mmol/L   Potassium 4.5 3.5 - 5.1 mmol/L   Chloride 99 (L) 101 - 111 mmol/L   CO2 19 (L) 22 - 32 mmol/L   Glucose, Bld 125 (H) 65 - 99 mg/dL   BUN 14 6 - 20 mg/dL   Creatinine, Ser 0.94 0.61 - 1.24 mg/dL   Calcium 9.3 8.9 - 10.3 mg/dL   GFR calc non Af Amer >60 >60 mL/min   GFR calc Af Amer >60 >60 mL/min    Comment: (NOTE) The eGFR has been calculated using the CKD EPI equation. This calculation has not been validated in all clinical situations. eGFR's persistently <60 mL/min signify possible Chronic Kidney Disease.    Anion gap 9 5 - 15  I-stat troponin, ED     Status: None   Collection Time: 01/02/16 12:29  AM  Result Value Ref Range   Troponin i, poc 0.03 0.00 - 0.08 ng/mL   Comment 3            Comment: Due to the release kinetics of cTnI, a negative result within the first hours of the onset of symptoms does not rule out myocardial infarction with certainty. If myocardial infarction is still suspected, repeat the test at appropriate intervals.   Basic metabolic panel     Status: Abnormal   Collection Time: 01/02/16  6:31 AM  Result Value Ref Range   Sodium 131 (L) 135 - 145 mmol/L   Potassium 4.8 3.5 - 5.1 mmol/L   Chloride 99 (L) 101 - 111 mmol/L   CO2 23 22 - 32 mmol/L   Glucose, Bld 141 (H) 65 - 99 mg/dL   BUN 13 6 - 20 mg/dL   Creatinine, Ser 0.92 0.61 - 1.24 mg/dL   Calcium 9.1 8.9 - 10.3 mg/dL   GFR calc non Af Amer >60 >60 mL/min   GFR calc Af Amer >60 >60 mL/min    Comment: (NOTE) The eGFR has been calculated using the CKD EPI equation. This calculation has not been validated in all clinical situations. eGFR's persistently <60 mL/min signify possible Chronic Kidney Disease.    Anion gap 9 5 - 15  CBC     Status: Abnormal   Collection Time: 01/02/16  6:31 AM  Result Value Ref Range   WBC 12.0 (H) 4.0 - 10.5 K/uL   RBC 3.19 (L) 4.22 - 5.81 MIL/uL   Hemoglobin 10.7 (L) 13.0 - 17.0 g/dL   HCT 31.4 (L) 39.0 - 52.0 %   MCV 98.4 78.0 - 100.0 fL   MCH 33.5 26.0 - 34.0 pg   MCHC 34.1 30.0 - 36.0 g/dL   RDW 12.4 11.5 - 15.5 %   Platelets 228 150 - 400 K/uL  Glucose, capillary     Status: Abnormal   Collection Time: 01/02/16  7:45 AM  Result Value Ref Range   Glucose-Capillary 143 (H) 65 - 99 mg/dL  Glucose, capillary     Status: Abnormal   Collection Time: 01/03/16  6:21 AM  Result Value Ref Range   Glucose-Capillary 124 (H) 65 - 99 mg/dL  Glucose, capillary     Status: Abnormal   Collection Time: 01/03/16  7:26 AM  Result Value Ref Range   Glucose-Capillary 136 (H) 65 - 99 mg/dL   Comment 1 Notify RN  Dg Chest 2 View  Result Date: 01/02/2016 CLINICAL  DATA:  Acute onset of left shoulder pain and syncope. Initial encounter. EXAM: CHEST  2 VIEW COMPARISON:  Chest radiograph performed 11/05/2013, and CT of the chest performed 11/15/2013 FINDINGS: The lungs are well-aerated. Mild bilateral atelectasis is noted. There is no evidence of pleural effusion or pneumothorax. The heart is mildly enlarged. The patient is status post median sternotomy. A pacemaker is noted at the left chest wall, with leads ending at the right atrium and right ventricle. No acute osseous abnormalities are seen. IMPRESSION: Mild bilateral atelectasis noted. Lungs otherwise clear. Mild cardiomegaly. No displaced rib fracture seen. Electronically Signed   By: Garald Balding M.D.   On: 01/02/2016 00:58   Ct Head Wo Contrast  Result Date: 01/02/2016 CLINICAL DATA:  Status post fall in hallway. Hit head. Bleeding at the posterior aspect of the head, with large laceration. Initial encounter. Initial encounter. EXAM: CT HEAD WITHOUT CONTRAST TECHNIQUE: Contiguous axial images were obtained from the base of the skull through the vertex without intravenous contrast. COMPARISON:  CT of the head performed 09/20/2014 FINDINGS: Brain: No evidence of acute infarction, hemorrhage, hydrocephalus, extra-axial collection or mass lesion/mass effect. Prominence of the ventricles and sulci reflects moderate cortical volume loss. Cerebellar atrophy is noted. Scattered periventricular and subcortical white matter change likely reflects small vessel ischemic microangiopathy. The brainstem and fourth ventricle are within normal limits. The basal ganglia are unremarkable in appearance. The cerebral hemispheres demonstrate grossly normal gray-white differentiation. No mass effect or midline shift is seen. Vascular: No hyperdense vessel or unexpected calcification. Skull: There is no evidence of fracture; visualized osseous structures are unremarkable in appearance. Sinuses/Orbits: The orbits are within normal limits.  Mucosal thickening is noted at the maxillary sinuses bilaterally. The remaining paranasal sinuses and mastoid air cells are well-aerated. Other: A soft tissue laceration is noted overlying the left posterior parietal calvarium. IMPRESSION: 1. No evidence of traumatic intracranial injury or fracture. 2. Soft tissue laceration overlying the left posterior parietal calvarium. 3. Moderate cortical volume loss and scattered small vessel ischemic microangiopathy. 4. Mucosal thickening at the maxillary sinuses bilaterally. Electronically Signed   By: Garald Balding M.D.   On: 01/02/2016 01:44   Ct Shoulder Left Wo Contrast  Result Date: 01/02/2016 CLINICAL DATA:  Left shoulder and clavicular pain following a fall. EXAM: CT OF THE LEFT SHOULDER WITHOUT CONTRAST TECHNIQUE: Multidetector CT imaging was performed according to the standard protocol. Multiplanar CT image reconstructions were also generated. COMPARISON:  Left shoulder radiographs 01/02/2016 FINDINGS: Acute appearing comminuted fractures of the left acromion with some inferior displacement of the distal fracture fragments. Old ununited fracture of the distal left clavicle. Acute comminuted displaced and distracted fractures of the medial left clavicle. Soft tissue infiltration around the medial clavicle and around the acromial on consistent with contusion. Moderately large subcoracoid bursal fluid collection. Severe degenerative changes in the left shoulder involving the glenohumeral joint with near complete loss of the joint space and large osteophytes and subcortical cysts present. Heterotopic calcification inferior to the glenohumeral joint. Calcifications in the rotator cuff tendons consistent with calcific tendinitis. Old appearing left anterior rib fracture. IMPRESSION: Acute comminuted fractures of the left acromion and of the medial left clavicle. Associated soft tissue contusions. Old ununited fracture of the distal left clavicle. Severe degenerative  changes in the glenohumeral joint. Large subcoracoid bursa. Calcifications in the rotator cuff consistent with calcific tendinitis. Electronically Signed   By: Lucienne Capers M.D.   On: 01/02/2016  04:19   Dg Shoulder Left  Result Date: 01/02/2016 CLINICAL DATA:  Acute onset of left shoulder pain. Initial encounter. EXAM: LEFT SHOULDER - 2+ VIEW COMPARISON:  Chest radiograph performed 11/05/2013 FINDINGS: There is no evidence of acute fracture or dislocation. The left humeral head is seated within the glenoid fossa. A prominent osteophyte is seen arising at the medial aspect of the humeral head. There is chronic nonunion of a fracture at the distal left clavicle. The left acromioclavicular joint is not well assessed. Cortical irregularity at the left acromion may reflect a chronic left acromial fracture. A pacemaker is noted overlying the left chest wall. No displaced rib fractures are seen. No significant soft tissue abnormalities are seen. The visualized portions of the left lung are clear. IMPRESSION: 1. No evidence of acute fracture or dislocation. 2. 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 3. Cortical irregularity of the left acromion may reflect a chronic left acromial fracture. Electronically Signed   By: Garald Balding M.D.   On: 01/02/2016 01:01    Review of Systems  Musculoskeletal: Positive for joint pain.  All other systems reviewed and are negative.  Blood pressure (!) 146/54, pulse 78, temperature 98 F (36.7 C), resp. rate 18, height '5\' 9"'$  (1.753 m), weight 86.5 kg (190 lb 11.2 oz), SpO2 96 %. Physical Exam  Constitutional: He appears well-developed.  HENT:  Head: Normocephalic.  Eyes: Pupils are equal, round, and reactive to light.  Neck: Normal range of motion.  Cardiovascular: Normal rate.   Respiratory: Effort normal.  Neurological: He is alert.  Skin: Skin is warm.  Psychiatric: He has a normal mood and affect.   examination the left shoulder  demonstrates tenderness to palpation superior laterally.  Motor sensory function to the hand is intact some swelling is present neck range of motion is full no other masses lymph adenopathy or skin changes noted in the left shoulder region  Assessment/Plan: Impression is left shoulder acromial fracture.  Acromial fracture looks acute.  He has some wear and tear of the before meals joint.  Rotator cuff difficult to assess.  Nonetheless the acromial fracture is the new fracture and that will take about 3 weeks to heal with sling immobilization.  I would favor sling immobilization coming out of the sling once a day for elbow range of motion and back in the sling.  We'll recheck him clinically and radiographically in 3 weeks for recheck.  He may need short-term skilled nursing placement depending on how he progresses with mobilization  Kare Dado SCOTT 01/03/2016, 1:31 PM

## 2016-01-03 NOTE — Progress Notes (Signed)
Left shoulder acromial fracture Sling for 3 weeks F/u 3 weeks pt understands  full consult to follow

## 2016-01-03 NOTE — Progress Notes (Signed)
NURSING PROGRESS NOTE  HARMON DEIGHAN [id number] Discharge Data: 01/03/2016 7:19 PM Attending Provider: No att. providers found PCP: Melinda Crutch, MD   Clinton Hughes Comings to be D/C'd Home per MD order.    All IV's will be discontinued and monitored for bleeding.  All belongings will be returned to patient for patient to take home.  Last Documented Vital Signs:  Blood pressure (!) 142/59, pulse 84, temperature 97.7 F (36.5 C), resp. rate 17, height 5\' 9"  (1.753 m), weight 86.5 kg (190 lb 11.2 oz), SpO2 98 %.  Joslyn Hy, MSN, RN, [employer]

## 2016-01-03 NOTE — Progress Notes (Signed)
Ambulated in halls with gait belt without incident. Joslyn Hy, MSN, RN, [employer]

## 2016-01-04 NOTE — Care Management Note (Signed)
Case Management Note  Patient Details  Name: Clinton Hughes MRN: [id number] Date of Birth: 14-Nov-1925  Subjective/Objective:                  Wheezing [R06.2] Sprain of left shoulder, initial encounter [S43.402A] Action/Plan: Discharge planning Expected Discharge Date:  01/03/16               Expected Discharge Plan:     In-House Referral:     Discharge planning Services     Post Acute Care Choice:    Choice offered to:     DME Arranged:   Hospital Bed DME Agency:  [facility]  HH Arranged:   HHPT HH Agency:  [facility]  Status of Service:  Complete  If discussed at [employer] of Stay Meetings, dates discussed:    Additional Comments: CM notes pt discharged home after hours last evening; CM called family and spoke with spouse of pt, [name] who chooses [facility] to render HHPT and for delivery of the hospital bed.  Referral called to [facility] rep, Tiffany.  CM called [facility] DME rep, Reggie to please arrange for delivery of the hospital bed.  No other CM needs were communicated. Dellie Catholic, RN 01/04/2016, 12:13 PM

## 2016-01-08 ENCOUNTER — Ambulatory Visit (INDEPENDENT_AMBULATORY_CARE_PROVIDER_SITE_OTHER): Payer: Medicare Other | Admitting: Ophthalmology

## 2016-01-09 DIAGNOSIS — Z23 Encounter for immunization: Secondary | ICD-10-CM | POA: Diagnosis not present

## 2016-01-09 DIAGNOSIS — I959 Hypotension, unspecified: Secondary | ICD-10-CM | POA: Diagnosis not present

## 2016-01-09 DIAGNOSIS — S0101XD Laceration without foreign body of scalp, subsequent encounter: Secondary | ICD-10-CM | POA: Diagnosis not present

## 2016-01-09 DIAGNOSIS — J441 Chronic obstructive pulmonary disease with (acute) exacerbation: Secondary | ICD-10-CM | POA: Diagnosis not present

## 2016-01-09 DIAGNOSIS — S42123A Displaced fracture of acromial process, unspecified shoulder, initial encounter for closed fracture: Secondary | ICD-10-CM | POA: Diagnosis not present

## 2016-01-21 DIAGNOSIS — E785 Hyperlipidemia, unspecified: Secondary | ICD-10-CM | POA: Diagnosis not present

## 2016-01-21 DIAGNOSIS — I119 Hypertensive heart disease without heart failure: Secondary | ICD-10-CM | POA: Diagnosis not present

## 2016-01-21 DIAGNOSIS — Z950 Presence of cardiac pacemaker: Secondary | ICD-10-CM | POA: Diagnosis not present

## 2016-01-21 DIAGNOSIS — Z8673 Personal history of transient ischemic attack (TIA), and cerebral infarction without residual deficits: Secondary | ICD-10-CM | POA: Diagnosis not present

## 2016-01-21 DIAGNOSIS — M4982 Spondylopathy in diseases classified elsewhere, cervical region: Secondary | ICD-10-CM | POA: Diagnosis not present

## 2016-01-21 DIAGNOSIS — I208 Other forms of angina pectoris: Secondary | ICD-10-CM | POA: Diagnosis not present

## 2016-01-21 DIAGNOSIS — I2510 Atherosclerotic heart disease of native coronary artery without angina pectoris: Secondary | ICD-10-CM | POA: Diagnosis not present

## 2016-01-21 DIAGNOSIS — Z79899 Other long term (current) drug therapy: Secondary | ICD-10-CM | POA: Diagnosis not present

## 2016-01-21 DIAGNOSIS — I951 Orthostatic hypotension: Secondary | ICD-10-CM | POA: Diagnosis not present

## 2016-01-26 DIAGNOSIS — E871 Hypo-osmolality and hyponatremia: Secondary | ICD-10-CM | POA: Diagnosis not present

## 2016-01-26 DIAGNOSIS — S42122D Displaced fracture of acromial process, left shoulder, subsequent encounter for fracture with routine healing: Secondary | ICD-10-CM | POA: Diagnosis not present

## 2016-01-26 DIAGNOSIS — I10 Essential (primary) hypertension: Secondary | ICD-10-CM | POA: Diagnosis not present

## 2016-01-29 DIAGNOSIS — E871 Hypo-osmolality and hyponatremia: Secondary | ICD-10-CM | POA: Diagnosis not present

## 2016-01-29 DIAGNOSIS — I951 Orthostatic hypotension: Secondary | ICD-10-CM | POA: Diagnosis not present

## 2016-01-29 DIAGNOSIS — I10 Essential (primary) hypertension: Secondary | ICD-10-CM | POA: Diagnosis not present

## 2016-02-02 DIAGNOSIS — I208 Other forms of angina pectoris: Secondary | ICD-10-CM | POA: Diagnosis not present

## 2016-02-02 DIAGNOSIS — I2510 Atherosclerotic heart disease of native coronary artery without angina pectoris: Secondary | ICD-10-CM | POA: Diagnosis not present

## 2016-02-02 DIAGNOSIS — E785 Hyperlipidemia, unspecified: Secondary | ICD-10-CM | POA: Diagnosis not present

## 2016-02-02 DIAGNOSIS — M4982 Spondylopathy in diseases classified elsewhere, cervical region: Secondary | ICD-10-CM | POA: Diagnosis not present

## 2016-02-02 DIAGNOSIS — I119 Hypertensive heart disease without heart failure: Secondary | ICD-10-CM | POA: Diagnosis not present

## 2016-02-02 DIAGNOSIS — Z79899 Other long term (current) drug therapy: Secondary | ICD-10-CM | POA: Diagnosis not present

## 2016-02-02 DIAGNOSIS — I951 Orthostatic hypotension: Secondary | ICD-10-CM | POA: Diagnosis not present

## 2016-02-02 DIAGNOSIS — Z8673 Personal history of transient ischemic attack (TIA), and cerebral infarction without residual deficits: Secondary | ICD-10-CM | POA: Diagnosis not present

## 2016-02-04 DIAGNOSIS — K5900 Constipation, unspecified: Secondary | ICD-10-CM | POA: Diagnosis not present

## 2016-02-04 DIAGNOSIS — R1310 Dysphagia, unspecified: Secondary | ICD-10-CM | POA: Diagnosis not present

## 2016-02-04 DIAGNOSIS — K219 Gastro-esophageal reflux disease without esophagitis: Secondary | ICD-10-CM | POA: Diagnosis not present

## 2016-02-11 ENCOUNTER — Ambulatory Visit (INDEPENDENT_AMBULATORY_CARE_PROVIDER_SITE_OTHER): Payer: Medicare Other | Admitting: Ophthalmology

## 2016-02-11 DIAGNOSIS — H43813 Vitreous degeneration, bilateral: Secondary | ICD-10-CM

## 2016-02-11 DIAGNOSIS — H35033 Hypertensive retinopathy, bilateral: Secondary | ICD-10-CM

## 2016-02-11 DIAGNOSIS — H318 Other specified disorders of choroid: Secondary | ICD-10-CM | POA: Diagnosis not present

## 2016-02-11 DIAGNOSIS — I10 Essential (primary) hypertension: Secondary | ICD-10-CM | POA: Diagnosis not present

## 2016-02-11 DIAGNOSIS — H35373 Puckering of macula, bilateral: Secondary | ICD-10-CM

## 2016-02-16 ENCOUNTER — Ambulatory Visit (INDEPENDENT_AMBULATORY_CARE_PROVIDER_SITE_OTHER): Payer: Medicare Other | Admitting: Orthopedic Surgery

## 2016-02-16 DIAGNOSIS — S42122D Displaced fracture of acromial process, left shoulder, subsequent encounter for fracture with routine healing: Secondary | ICD-10-CM | POA: Diagnosis not present

## 2016-02-20 DIAGNOSIS — I959 Hypotension, unspecified: Secondary | ICD-10-CM | POA: Diagnosis not present

## 2016-03-05 DIAGNOSIS — S43402A Unspecified sprain of left shoulder joint, initial encounter: Secondary | ICD-10-CM | POA: Diagnosis not present

## 2016-03-05 DIAGNOSIS — S0101XA Laceration without foreign body of scalp, initial encounter: Secondary | ICD-10-CM | POA: Diagnosis not present

## 2016-03-05 DIAGNOSIS — S42035A Nondisplaced fracture of lateral end of left clavicle, initial encounter for closed fracture: Secondary | ICD-10-CM | POA: Diagnosis not present

## 2016-03-05 DIAGNOSIS — S060X1A Concussion with loss of consciousness of 30 minutes or less, initial encounter: Secondary | ICD-10-CM | POA: Diagnosis not present

## 2016-03-08 ENCOUNTER — Telehealth (INDEPENDENT_AMBULATORY_CARE_PROVIDER_SITE_OTHER): Payer: Self-pay | Admitting: Orthopedic Surgery

## 2016-03-08 NOTE — Telephone Encounter (Signed)
Barnetta Chapel from [facility] is calling for verbal orders  Verbal Orders: extend home health OT. 2 times a week for 3 weeks.   Barnetta Chapel, [place] [phone]

## 2016-03-09 NOTE — Telephone Encounter (Signed)
Ph #  for [phone], not previous.  IC Barnetta Chapel and advised ok for the orders for PT.

## 2016-03-22 DIAGNOSIS — Z79899 Other long term (current) drug therapy: Secondary | ICD-10-CM | POA: Diagnosis not present

## 2016-03-22 DIAGNOSIS — Z0000 Encounter for general adult medical examination without abnormal findings: Secondary | ICD-10-CM | POA: Diagnosis not present

## 2016-03-22 DIAGNOSIS — I10 Essential (primary) hypertension: Secondary | ICD-10-CM | POA: Diagnosis not present

## 2016-03-22 DIAGNOSIS — E7800 Pure hypercholesterolemia, unspecified: Secondary | ICD-10-CM | POA: Diagnosis not present

## 2016-03-24 DIAGNOSIS — I10 Essential (primary) hypertension: Secondary | ICD-10-CM | POA: Diagnosis not present

## 2016-03-24 DIAGNOSIS — E7800 Pure hypercholesterolemia, unspecified: Secondary | ICD-10-CM | POA: Diagnosis not present

## 2016-03-24 DIAGNOSIS — Z79899 Other long term (current) drug therapy: Secondary | ICD-10-CM | POA: Diagnosis not present

## 2016-03-25 ENCOUNTER — Telehealth (INDEPENDENT_AMBULATORY_CARE_PROVIDER_SITE_OTHER): Payer: Self-pay | Admitting: *Deleted

## 2016-03-25 NOTE — Telephone Encounter (Signed)
[facility] called to see if Dr. Marlou Sa signed OT orders that were sent in Oct. Call back number is [phone] [facility]

## 2016-03-30 NOTE — Telephone Encounter (Signed)
IC s/w [place], they have orders dated 03/19/16- so they have what they needed.

## 2016-05-05 DIAGNOSIS — Z951 Presence of aortocoronary bypass graft: Secondary | ICD-10-CM | POA: Diagnosis not present

## 2016-05-05 DIAGNOSIS — Z79899 Other long term (current) drug therapy: Secondary | ICD-10-CM | POA: Diagnosis not present

## 2016-05-05 DIAGNOSIS — I119 Hypertensive heart disease without heart failure: Secondary | ICD-10-CM | POA: Diagnosis not present

## 2016-05-05 DIAGNOSIS — I2510 Atherosclerotic heart disease of native coronary artery without angina pectoris: Secondary | ICD-10-CM | POA: Diagnosis not present

## 2016-05-05 DIAGNOSIS — Z950 Presence of cardiac pacemaker: Secondary | ICD-10-CM | POA: Diagnosis not present

## 2016-05-05 DIAGNOSIS — E785 Hyperlipidemia, unspecified: Secondary | ICD-10-CM | POA: Diagnosis not present

## 2016-05-05 DIAGNOSIS — M4982 Spondylopathy in diseases classified elsewhere, cervical region: Secondary | ICD-10-CM | POA: Diagnosis not present

## 2016-05-05 DIAGNOSIS — R040 Epistaxis: Secondary | ICD-10-CM | POA: Diagnosis not present

## 2016-05-05 DIAGNOSIS — R05 Cough: Secondary | ICD-10-CM | POA: Diagnosis not present

## 2016-05-05 DIAGNOSIS — I951 Orthostatic hypotension: Secondary | ICD-10-CM | POA: Diagnosis not present

## 2016-05-05 DIAGNOSIS — I208 Other forms of angina pectoris: Secondary | ICD-10-CM | POA: Diagnosis not present

## 2016-05-05 DIAGNOSIS — R351 Nocturia: Secondary | ICD-10-CM | POA: Diagnosis not present

## 2016-06-21 DIAGNOSIS — I890 Lymphedema, not elsewhere classified: Secondary | ICD-10-CM | POA: Diagnosis not present

## 2016-06-21 DIAGNOSIS — R609 Edema, unspecified: Secondary | ICD-10-CM | POA: Diagnosis not present

## 2016-06-28 ENCOUNTER — Other Ambulatory Visit: Payer: Self-pay | Admitting: Family Medicine

## 2016-06-28 DIAGNOSIS — I890 Lymphedema, not elsewhere classified: Secondary | ICD-10-CM

## 2016-06-28 DIAGNOSIS — J441 Chronic obstructive pulmonary disease with (acute) exacerbation: Secondary | ICD-10-CM | POA: Diagnosis not present

## 2016-06-29 ENCOUNTER — Ambulatory Visit
Admission: RE | Admit: 2016-06-29 | Discharge: 2016-06-29 | Disposition: A | Discharge status: Home / Self Care | Payer: Medicare Other | Source: Ambulatory Visit | Attending: Family Medicine | Admitting: Family Medicine

## 2016-06-29 DIAGNOSIS — I890 Lymphedema, not elsewhere classified: Secondary | ICD-10-CM

## 2016-06-29 DIAGNOSIS — R918 Other nonspecific abnormal finding of lung field: Secondary | ICD-10-CM | POA: Diagnosis not present

## 2016-06-29 MED ORDER — IOPAMIDOL (ISOVUE-300) INJECTION 61%
75.0000 mL | Freq: Once | INTRAVENOUS | Status: AC | PRN
  Administered 2016-06-29: 75 mL via INTRAVENOUS

## 2016-06-30 ENCOUNTER — Telehealth (INDEPENDENT_AMBULATORY_CARE_PROVIDER_SITE_OTHER): Payer: Self-pay | Admitting: Orthopedic Surgery

## 2016-06-30 NOTE — Telephone Encounter (Signed)
See note, pt is s/p left shoulder acromial fracture from August 2017, Patient has appt coming up- need to see sooner?  I think this more of an FYI than any attn needed.

## 2016-06-30 NOTE — Telephone Encounter (Signed)
Ok to see not too much to do however

## 2016-06-30 NOTE — Telephone Encounter (Signed)
Noted, patient has appt 07/08/16

## 2016-06-30 NOTE — Telephone Encounter (Signed)
Patient's wife Arbie Cookey called advised patient is having swelling in his left arm and hand. She said patient has a large amount of fluid accumulating in his left arm and hand. Arbie Cookey advised patient is on a nebulizer and is having a problem with coughing and weezing. Dr Harrington Challenger is  referring him back to Dr Marlou Sa. Patient has an appointment scheduled for 06/07/16. The number to contact carol is [phone]

## 2016-07-08 ENCOUNTER — Ambulatory Visit (INDEPENDENT_AMBULATORY_CARE_PROVIDER_SITE_OTHER): Payer: Medicare Other | Admitting: Orthopedic Surgery

## 2016-07-15 DEATH — deceased

## 2017-02-23 ENCOUNTER — Ambulatory Visit (INDEPENDENT_AMBULATORY_CARE_PROVIDER_SITE_OTHER): Payer: Medicare Other | Admitting: Ophthalmology

## 2017-03-24 IMAGING — CT CT SHOULDER*L* W/O CM
3 series · 12 of 33 positions shown, 14 images · non-contrast
Comparison: Left shoulder radiographs 01/02/2016

CLINICAL DATA: Left shoulder and clavicular pain following a fall.

EXAM:
CT OF THE LEFT SHOULDER WITHOUT CONTRAST
TECHNIQUE: Multidetector CT imaging was performed according to the standard
protocol. Multiplanar CT image reconstructions were also generated.

[Series 4: (id) st ax · axial · 0.37mm/px · z∈[-119,-43]mm · 4 of 56 slices shown, 5 images]
[im 9/56  soft-tissue]
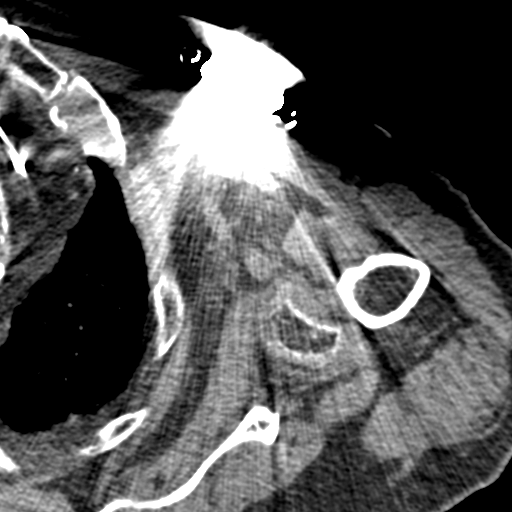
[im 9/56  bone]
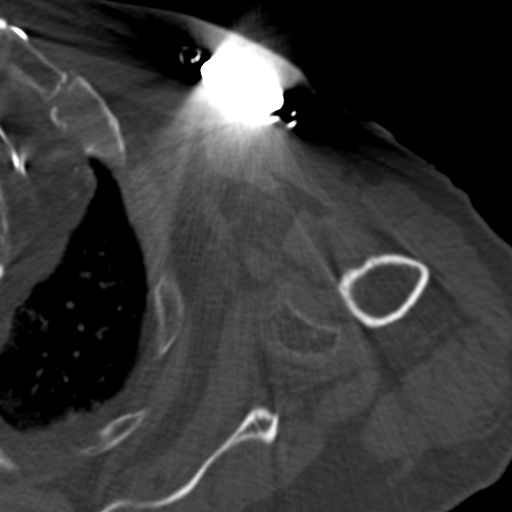
[im 22/56  bone]
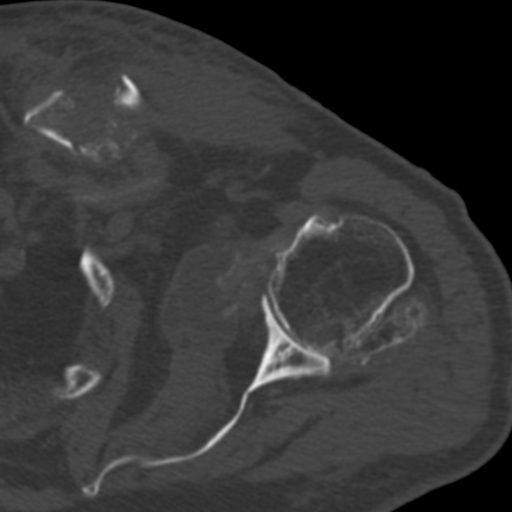
[im 34/56  bone]
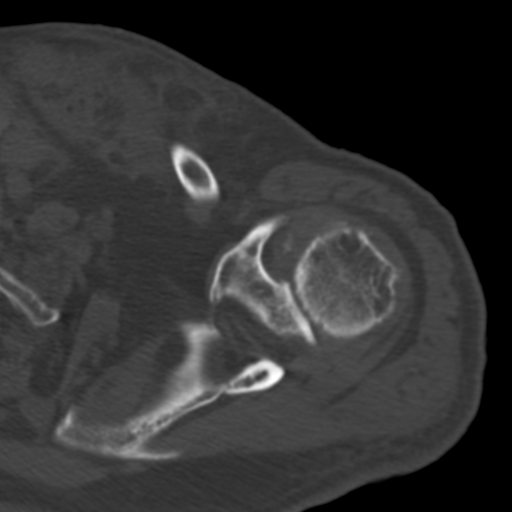
[im 47/56  bone]
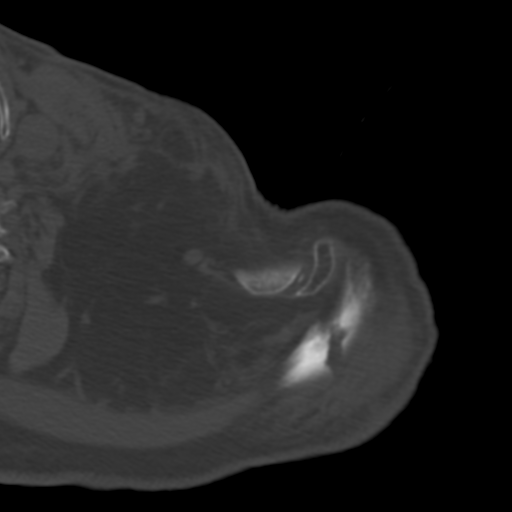

[Series 11: (id) bone cor · coronal · 0.29mm/px · 3 of 104 slices shown]
[im 21/104  bone]
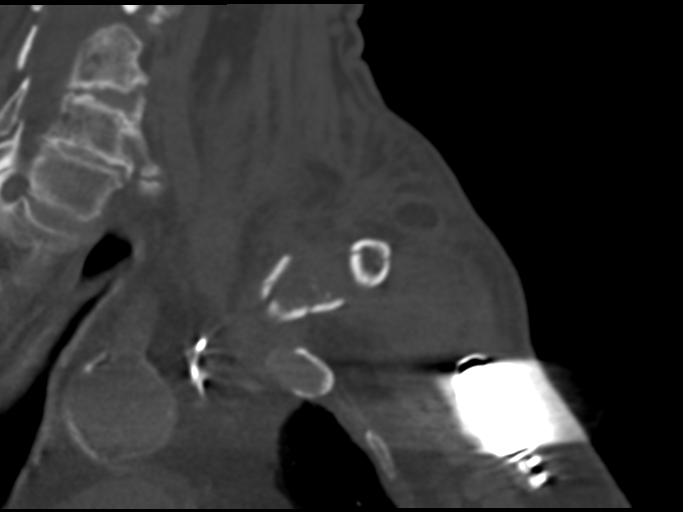
[im 42/104  bone]
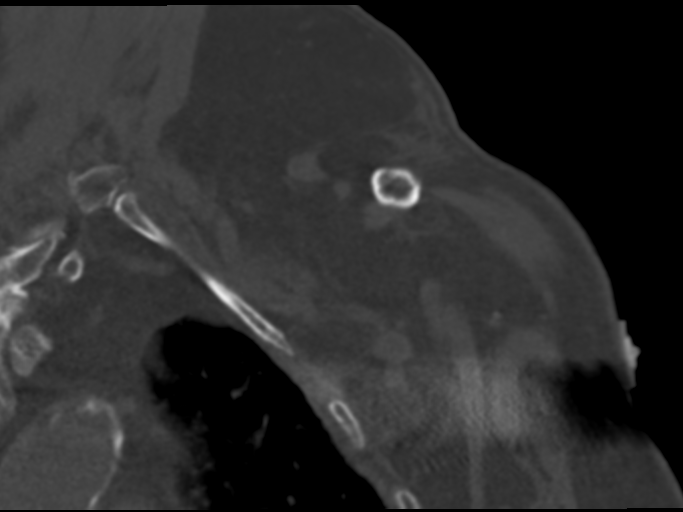
[im 62/104  bone]
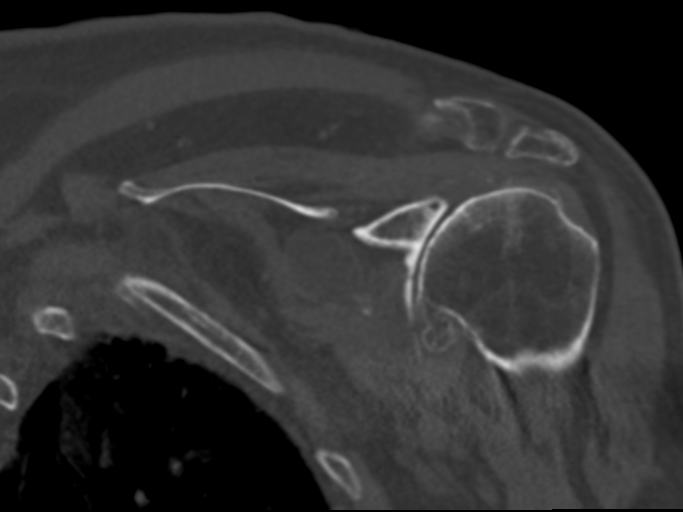

[Series 12: (id) bone sag · sagittal · 0.29mm/px · 5 of 95 slices shown, 6 images]
[im 32/95  bone]
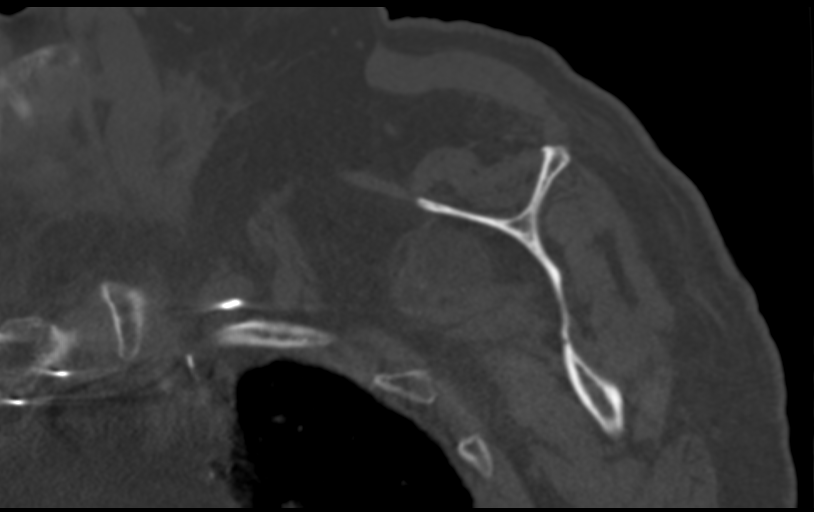
[im 40/95  bone]
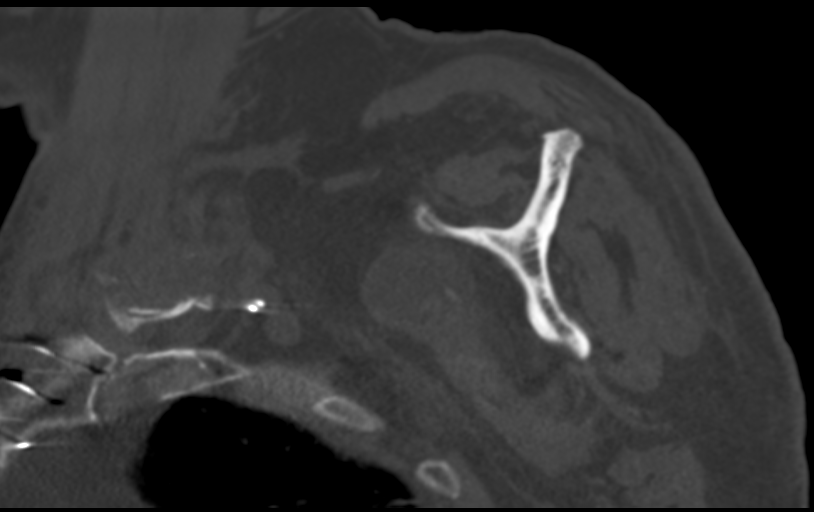
[im 48/95  soft-tissue]
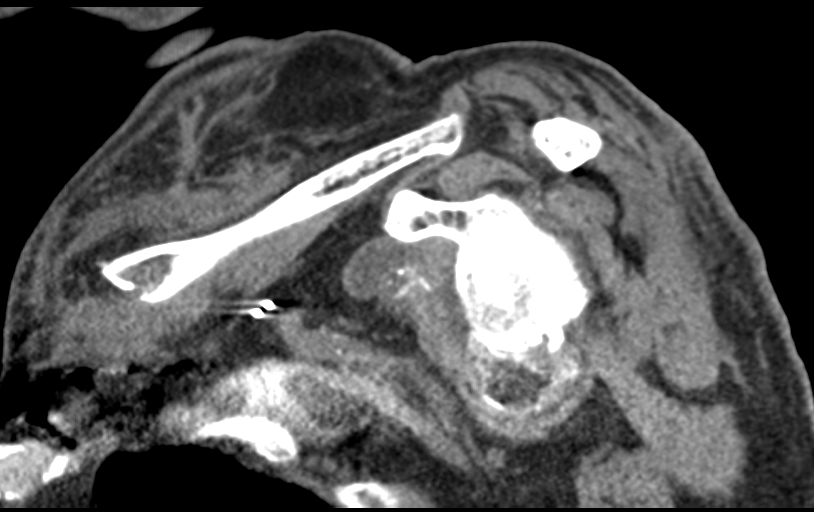
[im 48/95  bone]
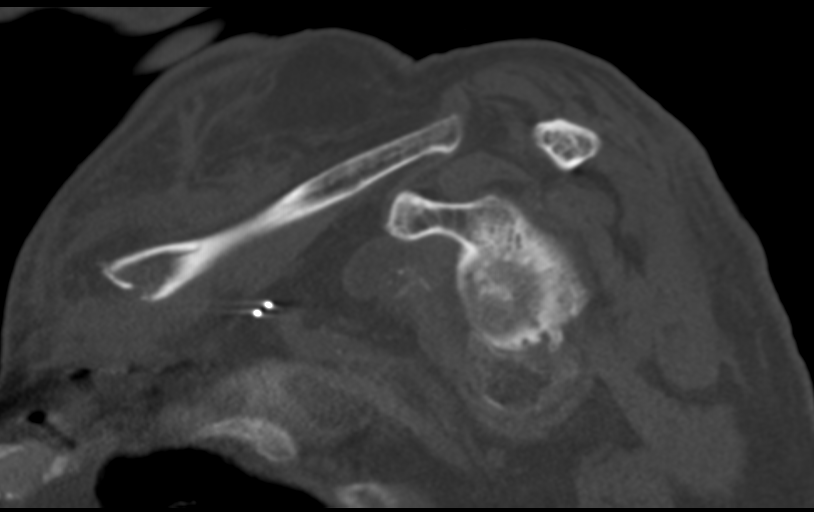
[im 55/95  bone]
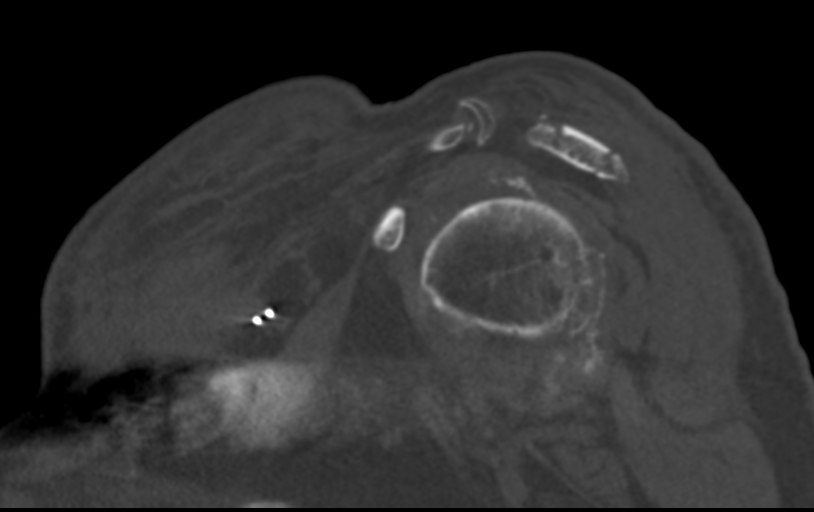
[im 63/95  bone]
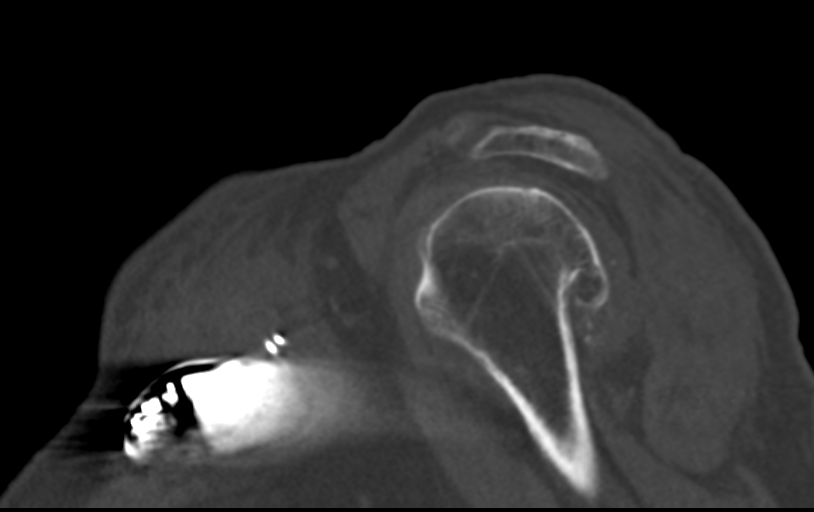

[12 of 33 positions shown; findings below may reference images not displayed]

FINDINGS: Acute appearing comminuted fractures of the left acromion with some
inferior displacement of the distal fracture fragments. Old ununited
fracture of the distal left clavicle. Acute comminuted displaced and
distracted fractures of the medial left clavicle. Soft tissue
infiltration around the medial clavicle and around the acromial on
consistent with contusion. Moderately large subcoracoid bursal fluid
collection. Severe degenerative changes in the left shoulder
involving the glenohumeral joint with near complete loss of the
joint space and large osteophytes and subcortical cysts present.
Heterotopic calcification inferior to the glenohumeral joint.
Calcifications in the rotator cuff tendons consistent with calcific
tendinitis. Old appearing left anterior rib fracture.
IMPRESSION: Acute comminuted fractures of the left acromion and of the medial
left clavicle. Associated soft tissue contusions.

Old ununited fracture of the distal left clavicle. Severe
degenerative changes in the glenohumeral joint. Large subcoracoid
bursa. Calcifications in the rotator cuff consistent with calcific
tendinitis.

## 2017-03-24 IMAGING — CT CT HEAD W/O CM
4 series · 15 of 47 positions shown, 17 images · non-contrast
Comparison: CT of the head performed 09/20/2014

CLINICAL DATA: Status post fall in hallway. Hit head. Bleeding at
the posterior aspect of the head, with large laceration. Initial
encounter. Initial encounter.

EXAM:
CT HEAD WITHOUT CONTRAST
TECHNIQUE: Contiguous axial images were obtained from the base of the skull
through the vertex without intravenous contrast.

[Series 2: head without · axial · non-contrast · 0.50mm/px · z∈[-130,-10]mm · 7 of 34 slices shown, 9 images]
[im 5/34  brain]
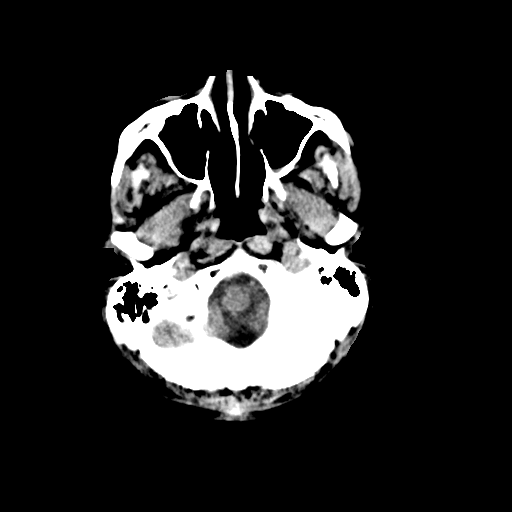
[im 5/34  bone]
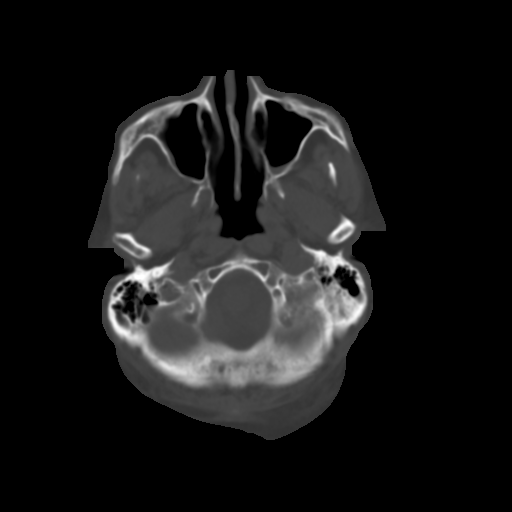
[im 9/34  brain]
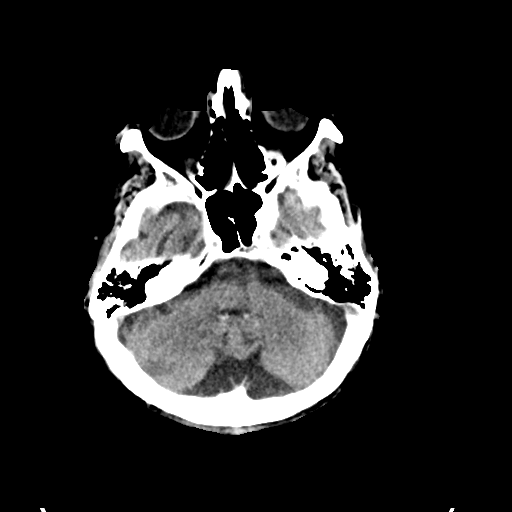
[im 13/34  brain]
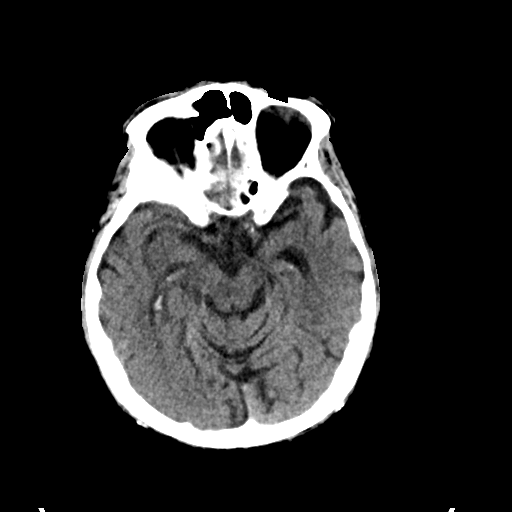
[im 17/34  brain]
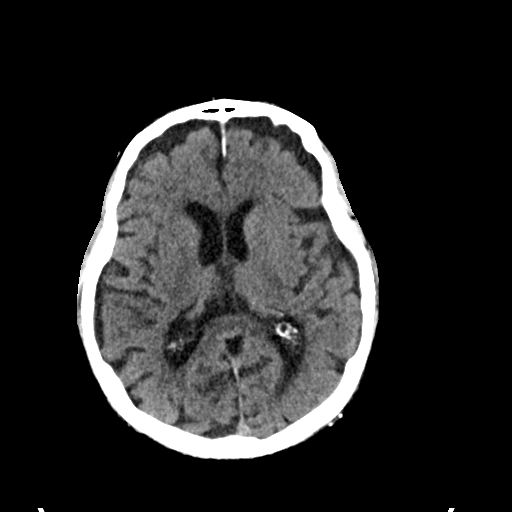
[im 21/34  brain]
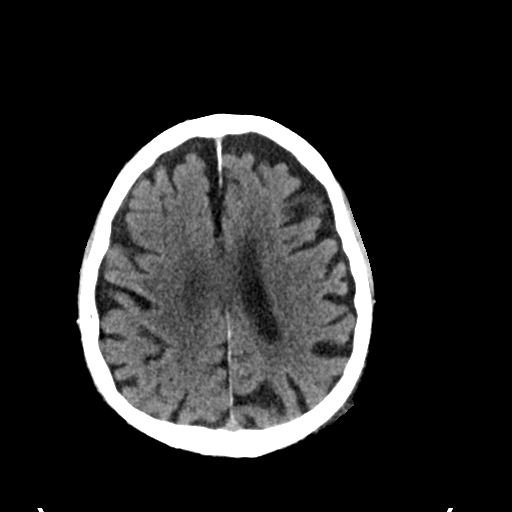
[im 21/34  bone]
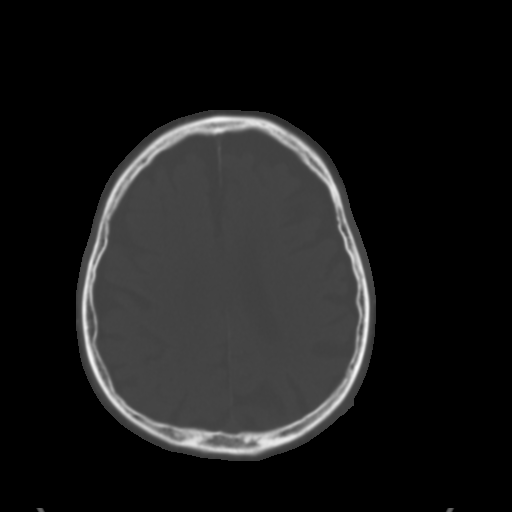
[im 25/34  brain]
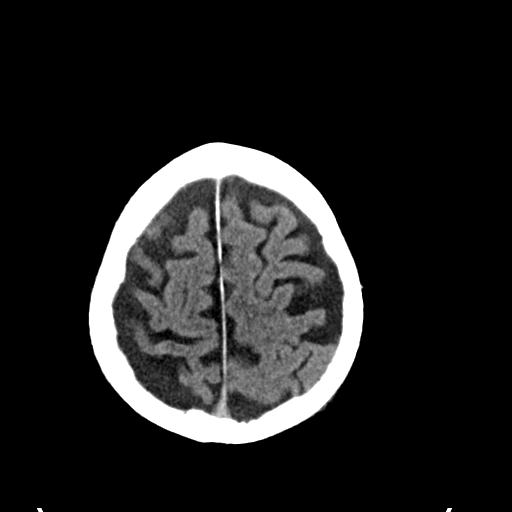
[im 29/34  brain]
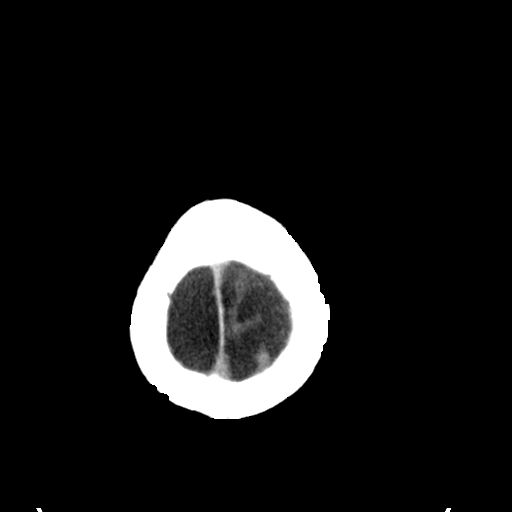

[Series 3: head bone · axial · 0.50mm/px · z∈[-134,-118]mm · 2 of 84 slices shown]
[im 9/84  bone]
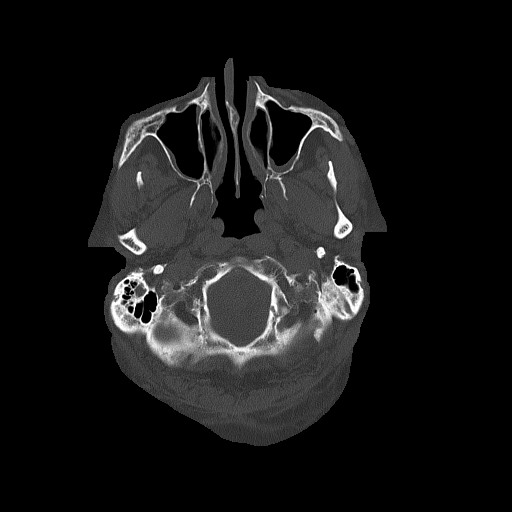
[im 17/84  bone]
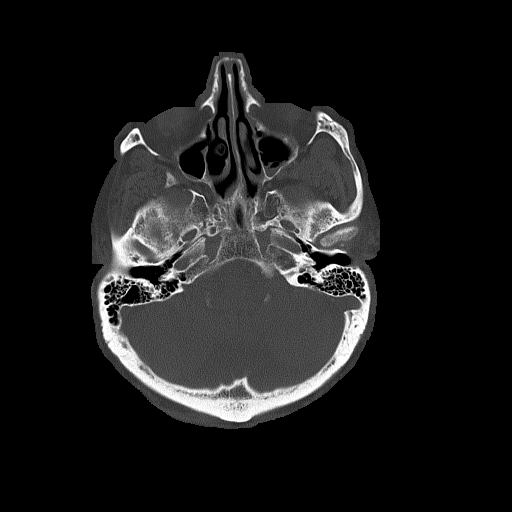

[Series 4: head without cor · coronal · non-contrast · 0.33mm/px · 3 of 73 slices shown]
[im 25/73  brain]
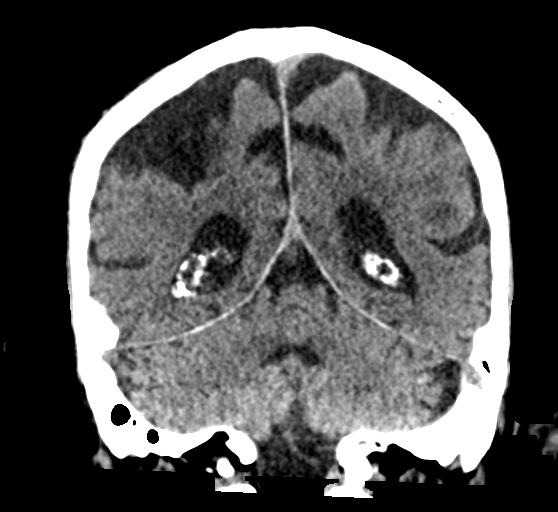
[im 33/73  brain]
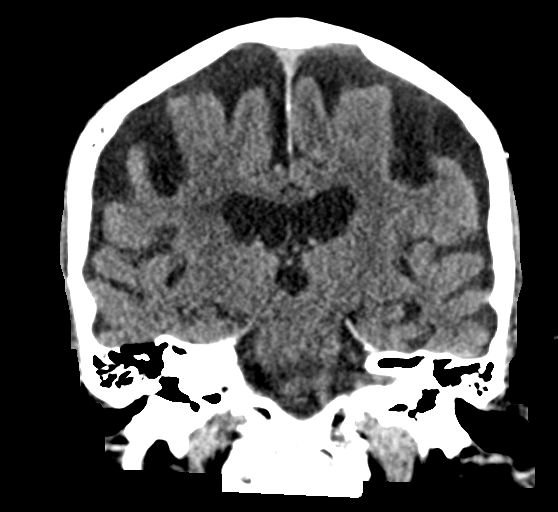
[im 41/73  brain]
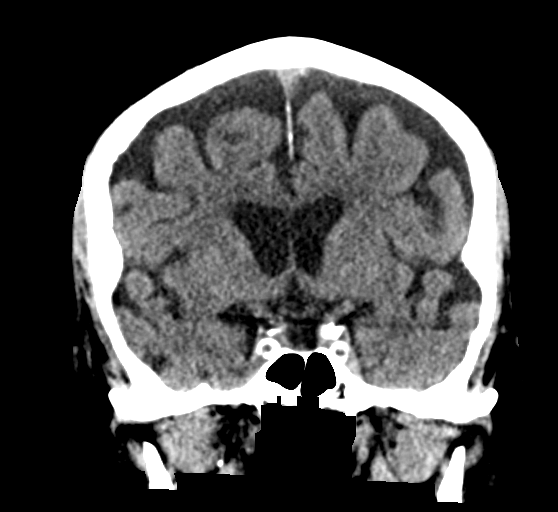

[Series 5: head without sag · sagittal · non-contrast · 0.33mm/px · 3 of 57 slices shown]
[im 19/57  brain]
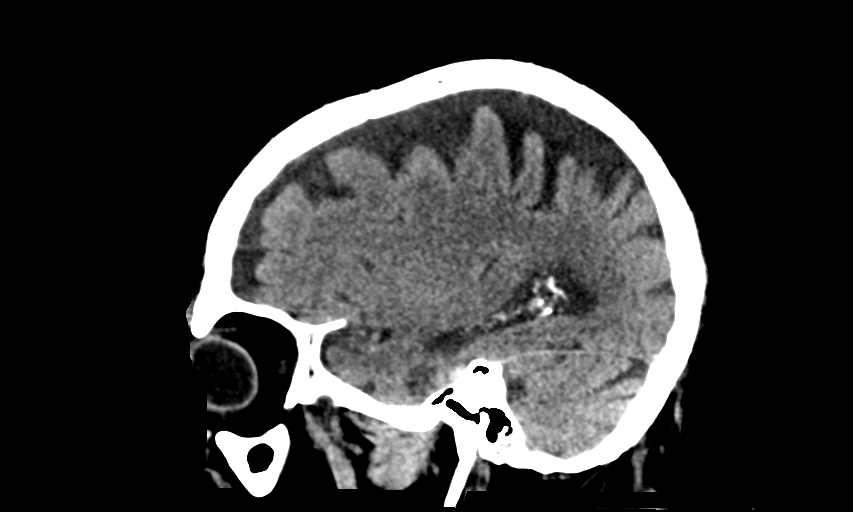
[im 29/57  brain]
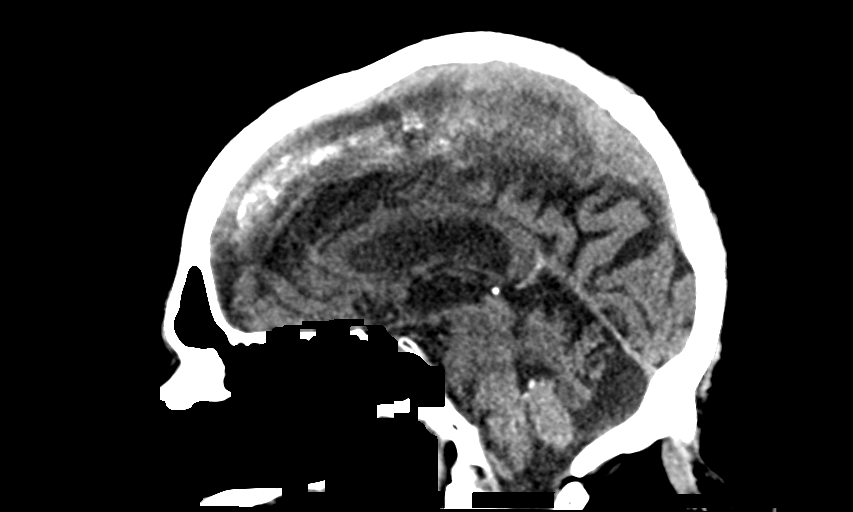
[im 38/57  brain]
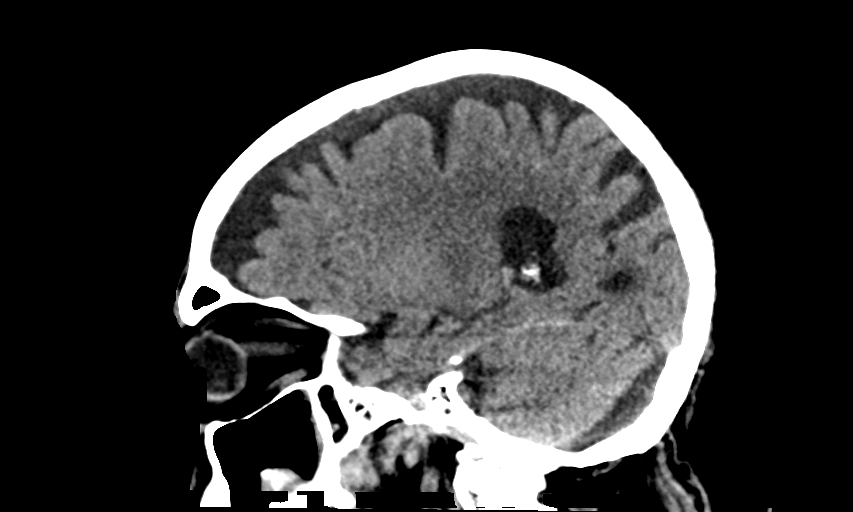

[15 of 47 positions shown; findings below may reference images not displayed]

FINDINGS: Brain: No evidence of acute infarction, hemorrhage, hydrocephalus,
extra-axial collection or mass lesion/mass effect.

Prominence of the ventricles and sulci reflects moderate cortical
volume loss. Cerebellar atrophy is noted. Scattered periventricular
and subcortical white matter change likely reflects small vessel
ischemic microangiopathy.

The brainstem and fourth ventricle are within normal limits. The
basal ganglia are unremarkable in appearance. The cerebral
hemispheres demonstrate grossly normal gray-white differentiation.
No mass effect or midline shift is seen.

Vascular: No hyperdense vessel or unexpected calcification.

Skull: There is no evidence of fracture; visualized osseous
structures are unremarkable in appearance.

Sinuses/Orbits: The orbits are within normal limits. Mucosal
thickening is noted at the maxillary sinuses bilaterally. The
remaining paranasal sinuses and mastoid air cells are well-aerated.

Other: A soft tissue laceration is noted overlying the left
posterior parietal calvarium.
IMPRESSION: 1. No evidence of traumatic intracranial injury or fracture.
2. Soft tissue laceration overlying the left posterior parietal
calvarium.
3. Moderate cortical volume loss and scattered small vessel ischemic
microangiopathy.
4. Mucosal thickening at the maxillary sinuses bilaterally.
